# Patient Record
Sex: Female | Born: 1950
Health system: Southern US, Community
[De-identification: ages and names within clinical notes are randomized; demographics above are authoritative.]

## PROBLEM LIST (undated history)

## (undated) DIAGNOSIS — F329 Major depressive disorder, single episode, unspecified: Secondary | ICD-10-CM

## (undated) DIAGNOSIS — F32A Depression, unspecified: Secondary | ICD-10-CM

## (undated) DIAGNOSIS — I10 Essential (primary) hypertension: Secondary | ICD-10-CM

## (undated) DIAGNOSIS — E785 Hyperlipidemia, unspecified: Secondary | ICD-10-CM

## (undated) HISTORY — DX: Hyperlipidemia, unspecified: E78.5

## (undated) HISTORY — PX: BREAST BIOPSY: SHX20

## (undated) HISTORY — PX: BREAST SURGERY: SHX581

## (undated) HISTORY — PX: REDUCTION MAMMAPLASTY: SUR839

## (undated) HISTORY — PX: BREAST EXCISIONAL BIOPSY: SUR124

---
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
## 1998-07-22 ENCOUNTER — Encounter: Admission: RE | Admit: 1998-07-22 | Discharge: 1998-10-20 | Payer: Self-pay | Admitting: Pulmonary Disease

## 1998-12-09 ENCOUNTER — Encounter: Payer: Self-pay | Admitting: Emergency Medicine

## 1998-12-09 ENCOUNTER — Emergency Department (HOSPITAL_COMMUNITY): Admission: EM | Admit: 1998-12-09 | Discharge: 1998-12-09 | Payer: Self-pay | Admitting: Emergency Medicine

## 2000-01-21 ENCOUNTER — Emergency Department (HOSPITAL_COMMUNITY): Admission: EM | Admit: 2000-01-21 | Discharge: 2000-01-21 | Payer: Self-pay | Admitting: Emergency Medicine

## 2000-01-21 ENCOUNTER — Encounter: Payer: Self-pay | Admitting: Emergency Medicine

## 2004-05-09 ENCOUNTER — Emergency Department (HOSPITAL_COMMUNITY): Admission: EM | Admit: 2004-05-09 | Discharge: 2004-05-10 | Payer: Self-pay | Admitting: Emergency Medicine

## 2004-05-10 ENCOUNTER — Inpatient Hospital Stay (HOSPITAL_COMMUNITY): Admission: AD | Admit: 2004-05-10 | Discharge: 2004-05-10 | Payer: Self-pay | Admitting: Psychiatry

## 2011-11-05 ENCOUNTER — Other Ambulatory Visit: Payer: Self-pay

## 2011-11-05 ENCOUNTER — Emergency Department (HOSPITAL_COMMUNITY): Payer: Medicaid Other

## 2011-11-05 ENCOUNTER — Encounter: Payer: Self-pay | Admitting: *Deleted

## 2011-11-05 ENCOUNTER — Emergency Department (HOSPITAL_COMMUNITY)
Admission: EM | Admit: 2011-11-05 | Discharge: 2011-11-05 | Disposition: A | Discharge status: Home / Self Care | Payer: Medicaid Other | Attending: Emergency Medicine | Admitting: Emergency Medicine

## 2011-11-05 DIAGNOSIS — R0789 Other chest pain: Secondary | ICD-10-CM | POA: Insufficient documentation

## 2011-11-05 DIAGNOSIS — R109 Unspecified abdominal pain: Secondary | ICD-10-CM | POA: Insufficient documentation

## 2011-11-05 HISTORY — DX: Major depressive disorder, single episode, unspecified: F32.9

## 2011-11-05 HISTORY — DX: Depression, unspecified: F32.A

## 2011-11-05 LAB — DIFFERENTIAL
Basophils Absolute: 0.1 10*3/uL (ref 0.0–0.1)
Basophils Relative: 1 % (ref 0–1)
Eosinophils Absolute: 0.1 10*3/uL (ref 0.0–0.7)
Eosinophils Relative: 3 % (ref 0–5)
Lymphocytes Relative: 33 % (ref 12–46)
Lymphs Abs: 1.7 10*3/uL (ref 0.7–4.0)
Monocytes Absolute: 0.4 10*3/uL (ref 0.1–1.0)
Monocytes Relative: 8 % (ref 3–12)
Neutro Abs: 2.8 10*3/uL (ref 1.7–7.7)
Neutrophils Relative %: 56 % (ref 43–77)

## 2011-11-05 LAB — COMPREHENSIVE METABOLIC PANEL
ALT: 22 U/L (ref 0–35)
AST: 21 U/L (ref 0–37)
Albumin: 4 g/dL (ref 3.5–5.2)
Alkaline Phosphatase: 114 U/L (ref 39–117)
BUN: 13 mg/dL (ref 6–23)
CO2: 28 mEq/L (ref 19–32)
Calcium: 9.8 mg/dL (ref 8.4–10.5)
Chloride: 101 mEq/L (ref 96–112)
Creatinine, Ser: 1.25 mg/dL — ABNORMAL HIGH (ref 0.50–1.10)
GFR calc Af Amer: 54 mL/min — ABNORMAL LOW (ref >90)
GFR calc non Af Amer: 46 mL/min — ABNORMAL LOW (ref >90)
Glucose, Bld: 86 mg/dL (ref 70–99)
Potassium: 3.5 mEq/L (ref 3.5–5.1)
Sodium: 140 mEq/L (ref 135–145)
Total Bilirubin: 0.5 mg/dL (ref 0.3–1.2)
Total Protein: 7.9 g/dL (ref 6.0–8.3)

## 2011-11-05 LAB — CBC
HCT: 41.9 % (ref 36.0–46.0)
Hemoglobin: 14.7 g/dL (ref 12.0–15.0)
MCH: 29.9 pg (ref 26.0–34.0)
MCHC: 35.1 g/dL (ref 30.0–36.0)
MCV: 85.3 fL (ref 78.0–100.0)
Platelets: 247 10*3/uL (ref 150–400)
RBC: 4.91 MIL/uL (ref 3.87–5.11)
RDW: 13.4 % (ref 11.5–15.5)
WBC: 5.1 10*3/uL (ref 4.0–10.5)

## 2011-11-05 LAB — LACTIC ACID, PLASMA: Lactic Acid, Venous: 1.1 mmol/L (ref 0.5–2.2)

## 2011-11-05 MED ORDER — ONDANSETRON HCL 4 MG/2ML IJ SOLN
4.0000 mg | Freq: Once | INTRAMUSCULAR | Status: AC
  Administered 2011-11-05: 4 mg via INTRAVENOUS

## 2011-11-05 MED ORDER — HYDROMORPHONE HCL PF 2 MG/ML IJ SOLN
INTRAMUSCULAR | Status: AC
  Administered 2011-11-05: 1 mg
  Filled 2011-11-05: qty 1

## 2011-11-05 MED ORDER — ONDANSETRON HCL 4 MG/2ML IJ SOLN
INTRAMUSCULAR | Status: AC
  Filled 2011-11-05: qty 2

## 2011-11-05 MED ORDER — HYDROMORPHONE HCL 1 MG/ML PO LIQD: 1.0000 mg | Freq: Once | ORAL | Status: DC

## 2011-11-05 MED ORDER — IOHEXOL 300 MG/ML  SOLN
100.0000 mL | Freq: Once | INTRAMUSCULAR | Status: AC | PRN
  Administered 2011-11-05: 100 mL via INTRAVENOUS

## 2011-11-05 MED ORDER — SODIUM CHLORIDE 0.9 % IV BOLUS (SEPSIS): 1000.0000 mL | Freq: Once | INTRAVENOUS | Status: DC

## 2011-11-05 MED ORDER — HYDROCODONE-ACETAMINOPHEN 5-325 MG PO TABS: 2.0000 | ORAL_TABLET | ORAL | Status: AC | PRN
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
## 2011-11-05 NOTE — ED Notes (Signed)
Requested pt give urine sample. Pt attempted to give sample but was unable. Will attempt again later.

## 2011-11-05 NOTE — ED Provider Notes (Signed)
6:57 PM  Date: 11/05/2011  Rate: 73  Rhythm: normal sinus rhythm  QRS Axis: normal  Intervals: normal  ST/T Wave abnormalities: normal  Conduction Disutrbances:none  Narrative Interpretation: Normal EKG  Old EKG Reviewed: unchanged    Carleene Cooper III, MD 11/06/11 [phone]
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
## 2012-02-01 ENCOUNTER — Other Ambulatory Visit (HOSPITAL_COMMUNITY)
Admission: RE | Admit: 2012-02-01 | Discharge: 2012-02-01 | Disposition: A | Discharge status: Home / Self Care | Payer: PRIVATE HEALTH INSURANCE | Source: Ambulatory Visit | Attending: Family Medicine | Admitting: Family Medicine

## 2012-02-01 ENCOUNTER — Other Ambulatory Visit: Payer: Self-pay | Admitting: Family Medicine

## 2012-02-01 DIAGNOSIS — Z1159 Encounter for screening for other viral diseases: Secondary | ICD-10-CM | POA: Insufficient documentation

## 2012-02-01 DIAGNOSIS — Z124 Encounter for screening for malignant neoplasm of cervix: Secondary | ICD-10-CM | POA: Insufficient documentation

## 2012-02-02 ENCOUNTER — Other Ambulatory Visit: Payer: Self-pay | Admitting: Family Medicine

## 2012-02-02 DIAGNOSIS — Z1231 Encounter for screening mammogram for malignant neoplasm of breast: Secondary | ICD-10-CM

## 2012-03-02 ENCOUNTER — Ambulatory Visit: Payer: Medicaid Other

## 2012-03-08 ENCOUNTER — Ambulatory Visit: Payer: Medicaid Other

## 2012-03-09 ENCOUNTER — Ambulatory Visit
Admission: RE | Admit: 2012-03-09 | Discharge: 2012-03-09 | Disposition: A | Discharge status: Home / Self Care | Payer: Medicaid Other | Source: Ambulatory Visit | Attending: Family Medicine | Admitting: Family Medicine

## 2012-03-09 DIAGNOSIS — Z1231 Encounter for screening mammogram for malignant neoplasm of breast: Secondary | ICD-10-CM

## 2013-03-09 ENCOUNTER — Other Ambulatory Visit (HOSPITAL_COMMUNITY)
Admission: RE | Admit: 2013-03-09 | Discharge: 2013-03-09 | Disposition: A | Discharge status: Home / Self Care | Payer: PRIVATE HEALTH INSURANCE | Source: Ambulatory Visit | Attending: Family Medicine | Admitting: Family Medicine

## 2013-03-09 ENCOUNTER — Other Ambulatory Visit: Payer: Self-pay | Admitting: Family Medicine

## 2013-03-09 DIAGNOSIS — Z124 Encounter for screening for malignant neoplasm of cervix: Secondary | ICD-10-CM | POA: Insufficient documentation

## 2013-03-09 DIAGNOSIS — Z1151 Encounter for screening for human papillomavirus (HPV): Secondary | ICD-10-CM | POA: Insufficient documentation

## 2013-03-16 ENCOUNTER — Other Ambulatory Visit: Payer: Self-pay

## 2013-03-16 DIAGNOSIS — Z1231 Encounter for screening mammogram for malignant neoplasm of breast: Secondary | ICD-10-CM

## 2013-04-17 ENCOUNTER — Ambulatory Visit
Admission: RE | Admit: 2013-04-17 | Discharge: 2013-04-17 | Disposition: A | Discharge status: Home / Self Care | Payer: PRIVATE HEALTH INSURANCE | Source: Ambulatory Visit

## 2013-04-17 DIAGNOSIS — Z1231 Encounter for screening mammogram for malignant neoplasm of breast: Secondary | ICD-10-CM

## 2014-03-11 ENCOUNTER — Other Ambulatory Visit: Payer: Self-pay

## 2014-03-11 DIAGNOSIS — Z1231 Encounter for screening mammogram for malignant neoplasm of breast: Secondary | ICD-10-CM

## 2014-03-11 DIAGNOSIS — Z9889 Other specified postprocedural states: Secondary | ICD-10-CM

## 2014-04-18 ENCOUNTER — Ambulatory Visit: Payer: Medicaid Other

## 2014-04-29 ENCOUNTER — Ambulatory Visit: Payer: Medicaid Other

## 2014-05-22 ENCOUNTER — Ambulatory Visit: Payer: Medicaid Other | Admitting: Cardiology

## 2014-05-31 ENCOUNTER — Ambulatory Visit: Payer: Medicaid Other | Admitting: Cardiology

## 2014-06-11 ENCOUNTER — Encounter: Payer: Self-pay | Admitting: *Deleted

## 2014-07-08 ENCOUNTER — Encounter: Payer: Self-pay | Admitting: General Surgery

## 2014-07-08 DIAGNOSIS — E785 Hyperlipidemia, unspecified: Secondary | ICD-10-CM

## 2014-07-08 DIAGNOSIS — R002 Palpitations: Secondary | ICD-10-CM

## 2014-07-12 ENCOUNTER — Ambulatory Visit: Payer: Medicaid Other

## 2014-07-25 ENCOUNTER — Ambulatory Visit: Payer: Medicaid Other

## 2014-07-26 ENCOUNTER — Ambulatory Visit: Payer: Medicaid Other

## 2014-08-06 ENCOUNTER — Institutional Professional Consult (permissible substitution): Payer: Medicaid Other | Admitting: Cardiology

## 2014-08-30 ENCOUNTER — Encounter: Payer: Self-pay | Admitting: Cardiology

## 2014-09-23 ENCOUNTER — Institutional Professional Consult (permissible substitution): Payer: Medicaid Other | Admitting: Cardiology

## 2014-09-27 ENCOUNTER — Institutional Professional Consult (permissible substitution): Payer: Medicaid Other | Admitting: Cardiology

## 2015-02-05 ENCOUNTER — Other Ambulatory Visit: Payer: Self-pay | Admitting: Family Medicine

## 2015-02-05 DIAGNOSIS — R109 Unspecified abdominal pain: Secondary | ICD-10-CM

## 2015-02-12 ENCOUNTER — Other Ambulatory Visit: Payer: Medicaid Other

## 2015-02-13 ENCOUNTER — Ambulatory Visit
Admission: RE | Admit: 2015-02-13 | Discharge: 2015-02-13 | Disposition: A | Discharge status: Home / Self Care | Payer: Medicare Other | Source: Ambulatory Visit | Attending: Family Medicine | Admitting: Family Medicine

## 2015-02-13 ENCOUNTER — Encounter (INDEPENDENT_AMBULATORY_CARE_PROVIDER_SITE_OTHER): Payer: Self-pay

## 2015-02-13 DIAGNOSIS — R109 Unspecified abdominal pain: Secondary | ICD-10-CM

## 2015-02-13 MED ORDER — IOHEXOL 300 MG/ML  SOLN
100.0000 mL | Freq: Once | INTRAMUSCULAR | Status: AC | PRN
  Administered 2015-02-13: 100 mL via INTRAVENOUS

## 2015-02-20 ENCOUNTER — Other Ambulatory Visit: Payer: Self-pay | Admitting: Family Medicine

## 2015-02-20 DIAGNOSIS — R109 Unspecified abdominal pain: Secondary | ICD-10-CM

## 2015-03-04 ENCOUNTER — Other Ambulatory Visit: Payer: Medicare Other

## 2015-03-11 ENCOUNTER — Other Ambulatory Visit: Payer: Medicare Other

## 2015-03-13 ENCOUNTER — Other Ambulatory Visit: Payer: Medicare Other

## 2015-03-17 ENCOUNTER — Ambulatory Visit
Admission: RE | Admit: 2015-03-17 | Discharge: 2015-03-17 | Disposition: A | Discharge status: Home / Self Care | Payer: Medicare Other | Source: Ambulatory Visit | Attending: Family Medicine | Admitting: Family Medicine

## 2015-03-17 DIAGNOSIS — R109 Unspecified abdominal pain: Secondary | ICD-10-CM

## 2015-03-17 MED ORDER — GADOBENATE DIMEGLUMINE 529 MG/ML IV SOLN
15.0000 mL | Freq: Once | INTRAVENOUS | Status: AC | PRN
  Administered 2015-03-17: 15 mL via INTRAVENOUS

## 2015-03-28 ENCOUNTER — Encounter: Payer: Self-pay | Admitting: Vascular Surgery

## 2015-04-18 ENCOUNTER — Emergency Department (HOSPITAL_COMMUNITY)
Admission: EM | Admit: 2015-04-18 | Discharge: 2015-04-18 | Disposition: A | Discharge status: Home / Self Care | Payer: Medicare Other | Attending: Emergency Medicine | Admitting: Emergency Medicine

## 2015-04-18 ENCOUNTER — Encounter (HOSPITAL_COMMUNITY): Payer: Self-pay

## 2015-04-18 DIAGNOSIS — Z8639 Personal history of other endocrine, nutritional and metabolic disease: Secondary | ICD-10-CM | POA: Diagnosis not present

## 2015-04-18 DIAGNOSIS — J45909 Unspecified asthma, uncomplicated: Secondary | ICD-10-CM | POA: Insufficient documentation

## 2015-04-18 DIAGNOSIS — F329 Major depressive disorder, single episode, unspecified: Secondary | ICD-10-CM | POA: Insufficient documentation

## 2015-04-18 DIAGNOSIS — Z79899 Other long term (current) drug therapy: Secondary | ICD-10-CM | POA: Diagnosis not present

## 2015-04-18 DIAGNOSIS — I10 Essential (primary) hypertension: Secondary | ICD-10-CM | POA: Insufficient documentation

## 2015-04-18 DIAGNOSIS — R51 Headache: Secondary | ICD-10-CM | POA: Diagnosis present

## 2015-04-18 LAB — I-STAT CHEM 8, ED
BUN: 14 mg/dL (ref 6–23)
Calcium, Ion: 1.21 mmol/L (ref 1.13–1.30)
Chloride: 102 mmol/L (ref 96–112)
Creatinine, Ser: 1.1 mg/dL (ref 0.50–1.10)
Glucose, Bld: 89 mg/dL (ref 70–99)
HCT: 44 % (ref 36.0–46.0)
Hemoglobin: 15 g/dL (ref 12.0–15.0)
Potassium: 4.1 mmol/L (ref 3.5–5.1)
Sodium: 139 mmol/L (ref 135–145)
TCO2: 26 mmol/L (ref 0–100)

## 2015-04-18 MED ORDER — HYDROCHLOROTHIAZIDE 25 MG PO TABS: 25.0000 mg | ORAL_TABLET | Freq: Every day | ORAL | Status: DC

## 2015-04-18 NOTE — ED Notes (Addendum)
Pt. Having a generalized headache for 2 days.  Pt. Denies any numbness or tingling.  Pt. Denies any blurred vision.  Went to her MD today for check-up for this headache.   She was sent to [place] for her High BP.  Pt. Was taking  Medication , she took her self off.   BP 165/82  Pt. Denies any chest pain or sob.  Pt. Denies any v/d.   Pt. Is nauseated. Pt. Has not ate all day.   GCS 15. Skin is warm and dry. Pt. Denies headache at present time.  She reports that the pain is intermittent and sharp twinges.

## 2015-04-18 NOTE — Discharge Instructions (Signed)
Hydrochlorothiazide, HCTZ capsules or tablets What is this medicine? HYDROCHLOROTHIAZIDE (hye droe klor oh THYE a zide) is a diuretic. It increases the amount of urine passed, which causes the body to lose salt and water. This medicine is used to treat high blood pressure. It is also reduces the swelling and water retention caused by various medical conditions, such as heart, liver, or kidney disease. This medicine may be used for other purposes; ask your health care provider or pharmacist if you have questions. COMMON BRAND NAME(S): Esidrix, Ezide, HydroDIURIL, Microzide, Oretic, Zide What should I tell my health care provider before I take this medicine? They need to know if you have any of these conditions: -diabetes -gout -immune system problems, like lupus -kidney disease or kidney stones -liver disease -pancreatitis -small amount of urine or difficulty passing urine -an unusual or allergic reaction to hydrochlorothiazide, sulfa drugs, other medicines, foods, dyes, or preservatives -pregnant or trying to get pregnant -breast-feeding How should I use this medicine? Take this medicine by mouth with a glass of water. Follow the directions on the prescription label. Take your medicine at regular intervals. Remember that you will need to pass urine frequently after taking this medicine. Do not take your doses at a time of day that will cause you problems. Do not stop taking your medicine unless your doctor tells you to. Talk to your pediatrician regarding the use of this medicine in children. Special care may be needed. Overdosage: If you think you have taken too much of this medicine contact a poison control center or emergency room at once. NOTE: This medicine is only for you. Do not share this medicine with others. What if I miss a dose? If you miss a dose, take it as soon as you can. If it is almost time for your next dose, take only that dose. Do not take double or extra doses. What may  interact with this medicine? -cholestyramine -colestipol -digoxin -dofetilide -lithium -medicines for blood pressure -medicines for diabetes -medicines that relax muscles for surgery -other diuretics -steroid medicines like prednisone or cortisone This list may not describe all possible interactions. Give your health care provider a list of all the medicines, herbs, non-prescription drugs, or dietary supplements you use. Also tell them if you smoke, drink alcohol, or use illegal drugs. Some items may interact with your medicine. What should I watch for while using this medicine? Visit your doctor or health care professional for regular checks on your progress. Check your blood pressure as directed. Ask your doctor or health care professional what your blood pressure should be and when you should contact him or her. You may need to be on a special diet while taking this medicine. Ask your doctor. Check with your doctor or health care professional if you get an attack of severe diarrhea, nausea and vomiting, or if you sweat a lot. The loss of too much body fluid can make it dangerous for you to take this medicine. You may get drowsy or dizzy. Do not drive, use machinery, or do anything that needs mental alertness until you know how this medicine affects you. Do not stand or sit up quickly, especially if you are an older patient. This reduces the risk of dizzy or fainting spells. Alcohol may interfere with the effect of this medicine. Avoid alcoholic drinks. This medicine may affect your blood sugar level. If you have diabetes, check with your doctor or health care professional before changing the dose of your diabetic medicine. This medicine  can make you more sensitive to the sun. Keep out of the sun. If you cannot avoid being in the sun, wear protective clothing and use sunscreen. Do not use sun lamps or tanning beds/booths. What side effects may I notice from receiving this medicine? Side effects  that you should report to your doctor or health care professional as soon as possible: -allergic reactions such as skin rash or itching, hives, swelling of the lips, mouth, tongue, or throat -changes in vision -chest pain -eye pain -fast or irregular heartbeat -feeling faint or lightheaded, falls -gout attack -muscle pain or cramps -pain or difficulty when passing urine -pain, tingling, numbness in the hands or feet -redness, blistering, peeling or loosening of the skin, including inside the mouth -unusually weak or tired Side effects that usually do not require medical attention (report to your doctor or health care professional if they continue or are bothersome): -change in sex drive or performance -dry mouth -headache -stomach upset This list may not describe all possible side effects. Call your doctor for medical advice about side effects. You may report side effects to FDA at 1-800-FDA-1088. Where should I keep my medicine? Keep out of the reach of children. Store at room temperature between 15 and 30 degrees C (59 and 86 degrees F). Do not freeze. Protect from light and moisture. Keep container closed tightly. Throw away any unused medicine after the expiration date. NOTE: This sheet is a summary. It may not cover all possible information. If you have questions about this medicine, talk to your doctor, pharmacist, or health care provider.  2015, [employer]vier/Gold Standard. (2010-07-31 12:57:37) Hypertension Hypertension, commonly called high blood pressure, is when the force of blood pumping through your arteries is too strong. Your arteries are the blood vessels that carry blood from your heart throughout your body. A blood pressure reading consists of a higher number over a lower number, such as 110/72. The higher number (systolic) is the pressure inside your arteries when your heart pumps. The lower number (diastolic) is the pressure inside your arteries when your heart relaxes.  Ideally you want your blood pressure below 120/80. Hypertension forces your heart to work harder to pump blood. Your arteries may become narrow or stiff. Having hypertension puts you at risk for heart disease, stroke, and other problems.  RISK FACTORS Some risk factors for high blood pressure are controllable. Others are not.  Risk factors you cannot control include:   Race. You may be at higher risk if you are African American.  Age. Risk increases with age.  Gender. Men are at higher risk than women before age 53 years. After age 36, women are at higher risk than men. Risk factors you can control include:  Not getting enough exercise or physical activity.  Being overweight.  Getting too much fat, sugar, calories, or salt in your diet.  Drinking too much alcohol. SIGNS AND SYMPTOMS Hypertension does not usually cause signs or symptoms. Extremely high blood pressure (hypertensive crisis) may cause headache, anxiety, shortness of breath, and nosebleed. DIAGNOSIS  To check if you have hypertension, your health care provider will measure your blood pressure while you are seated, with your arm held at the level of your heart. It should be measured at least twice using the same arm. Certain conditions can cause a difference in blood pressure between your right and left arms. A blood pressure reading that is higher than normal on one occasion does not mean that you need treatment. If one blood pressure  reading is high, ask your health care provider about having it checked again. TREATMENT  Treating high blood pressure includes making lifestyle changes and possibly taking medicine. Living a healthy lifestyle can help lower high blood pressure. You may need to change some of your habits. Lifestyle changes may include:  Following the DASH diet. This diet is high in fruits, vegetables, and whole grains. It is low in salt, red meat, and added sugars.  Getting at least 2 hours of brisk physical  activity every week.  Losing weight if necessary.  Not smoking.  Limiting alcoholic beverages.  Learning ways to reduce stress. If lifestyle changes are not enough to get your blood pressure under control, your health care provider may prescribe medicine. You may need to take more than one. Work closely with your health care provider to understand the risks and benefits. HOME CARE INSTRUCTIONS  Have your blood pressure rechecked as directed by your health care provider.   Take medicines only as directed by your health care provider. Follow the directions carefully. Blood pressure medicines must be taken as prescribed. The medicine does not work as well when you skip doses. Skipping doses also puts you at risk for problems.   Do not smoke.   Monitor your blood pressure at home as directed by your health care provider. SEEK MEDICAL CARE IF:   You think you are having a reaction to medicines taken.  You have recurrent headaches or feel dizzy.  You have swelling in your ankles.  You have trouble with your vision. SEEK IMMEDIATE MEDICAL CARE IF:  You develop a severe headache or confusion.  You have unusual weakness, numbness, or feel faint.  You have severe chest or abdominal pain.  You vomit repeatedly.  You have trouble breathing. MAKE SURE YOU:   Understand these instructions.  Will watch your condition.  Will get help right away if you are not doing well or get worse. Document Released: 12/06/2005 Document Revised: 04/22/2014 Document Reviewed: 09/28/2013 [facility] Patient Information 2015 [place], [place]. This information is not intended to replace advice given to you by your health care provider. Make sure you discuss any questions you have with your health care provider.

## 2015-04-18 NOTE — ED Provider Notes (Signed)
CSN: [id number]     Arrival date & time 04/18/15  1549 History   First MD Initiated Contact with Patient 04/18/15 1801     Chief Complaint  Patient presents with  . Headache    HPI Pt was seen at her doctors office today for headache.    Pt use to be on blood pressure medications.   She stopped taking maybe 6 months to a year.  Over the last few weeks she has been having increased stressors.  BP has been running higher.  She has had a headache diffusely for the last couple days.  Intermittent and sharp.  No vomiting.  No numbness or weakness.  Her doctor noticed her high blood pressure and sent her here.  Her BP has been up to 664Q systolic.  Headache is resolved now. Past Medical History  Diagnosis Date  . Depression     MAJOR DEPRESSION, DR. KAUR;PSYCHIATRIST  . Asthma   . Hyperlipidemia    Past Surgical History  Procedure Laterality Date  . Breast surgery     Family History  Problem Relation Age of Onset  . Pancreatic cancer Mother   . Hypertension Sister   . Heart attack Brother   . Colon cancer Paternal Uncle   . Mental illness Sister   . Hypertension Brother    History  Substance Use Topics  . Smoking status: Never Smoker   . Smokeless tobacco: Not on file  . Alcohol Use: No   OB History    No data available     Review of Systems  All other systems reviewed and are negative.     Allergies  Seroquel  Home Medications   Prior to Admission medications   Medication Sig Start Date End Date Taking? Authorizing Provider  albuterol (PROVENTIL HFA;VENTOLIN HFA) 108 (90 BASE) MCG/ACT inhaler Inhale 2 puffs into the lungs every 6 (six) hours as needed for wheezing or shortness of breath.   Yes Historical Provider, MD  clonazePAM (KLONOPIN) 1 MG tablet Take 4 mg by mouth at bedtime.    Yes Historical Provider, MD  FLUoxetine (PROZAC) 40 MG capsule Take 40 mg by mouth 2 (two) times daily.     Yes Historical Provider, MD  zolpidem (AMBIEN) 5 MG tablet Take 10 mg by  mouth at bedtime as needed. For sleep   Yes Historical Provider, MD  hydrochlorothiazide (HYDRODIURIL) 25 MG tablet Take 1 tablet (25 mg total) by mouth daily. 04/18/15   Dorie Rank, MD   BP 159/77 mmHg  Pulse 52  Temp(Src) 98.8 F (37.1 C) (Oral)  Resp 16  SpO2 100% Physical Exam  Constitutional: She appears well-developed and well-nourished. No distress.  HENT:  Head: Normocephalic and atraumatic.  Right Ear: External ear normal.  Left Ear: External ear normal.  Eyes: Conjunctivae are normal. Right eye exhibits no discharge. Left eye exhibits no discharge. No scleral icterus.  Neck: Neck supple. No tracheal deviation present.  Cardiovascular: Normal rate, regular rhythm and intact distal pulses.   Pulmonary/Chest: Effort normal and breath sounds normal. No stridor. No respiratory distress. She has no wheezes. She has no rales.  Abdominal: Soft. Bowel sounds are normal. She exhibits no distension. There is no tenderness. There is no rebound and no guarding.  Musculoskeletal: She exhibits no edema or tenderness.  Neurological: She is alert. She has normal strength. No cranial nerve deficit (no facial droop, extraocular movements intact, no slurred speech) or sensory deficit. She exhibits normal muscle tone. She displays no seizure activity.  Coordination normal.  Skin: Skin is warm and dry. No rash noted.  Psychiatric: She has a normal mood and affect.  Nursing note and vitals reviewed.   ED Course  Procedures (including critical care time) Labs Review Labs Reviewed  I-STAT CHEM 8, ED    MDM   Final diagnoses:  Essential hypertension    Pt's BP continued to improve without medications.  She does not have a headache.  Neuro exam is normal.  Doubt stroke or other acute complication associated with her BP.  Will dc home with HCTZ rx.  She use to take that medication.    Dorie Rank, MD 04/18/15 [phone]

## 2015-04-23 ENCOUNTER — Encounter: Payer: Medicare Other | Admitting: Vascular Surgery

## 2015-05-07 ENCOUNTER — Other Ambulatory Visit (HOSPITAL_COMMUNITY): Payer: Medicare Other

## 2015-05-07 ENCOUNTER — Encounter: Payer: Medicare Other | Admitting: Vascular Surgery

## 2015-06-02 ENCOUNTER — Encounter: Payer: Self-pay | Admitting: Vascular Surgery

## 2015-06-04 ENCOUNTER — Ambulatory Visit (INDEPENDENT_AMBULATORY_CARE_PROVIDER_SITE_OTHER): Payer: Medicare Other | Admitting: Vascular Surgery

## 2015-06-04 ENCOUNTER — Encounter: Payer: Self-pay | Admitting: Vascular Surgery

## 2015-06-04 VITALS — BP 173/91 | HR 64 | Ht 61.0 in | Wt 158.0 lb

## 2015-06-04 DIAGNOSIS — I70209 Unspecified atherosclerosis of native arteries of extremities, unspecified extremity: Secondary | ICD-10-CM | POA: Diagnosis not present

## 2015-06-04 NOTE — Progress Notes (Signed)
Vascular and Vein Specialist of [facility]  Patient name: Katie Potts MRN: [id number] DOB: [birth year] Sex: female  REASON FOR CONSULT: atherosclerosis noted on CT scan and MRI  HPI: Katie Potts is a 64 y.o. female who has been having abdominal pain off and on for approximately 2 years. Workup for this included a CT scan which showed some calcific disease in her aorta. There was no evidence of mesenteric artery occlusive disease. In addition the patient had an MRI which showed no evidence of mesenteric artery occlusive disease. There appeared to be possibly some stenosis at the origin of both renal arteries. The patient was sent for vascular consultation.  On history, she does describe some claudication symptoms in both calves. She has pain in the calf which is brought on by ambulation and  relieved with rest. She experiences some pain in the hip which is more likely related to arthritis. She denies any history of rest pain or history of nonhealing ulcers. She denies any previous history of DVT.   Past Medical History  Diagnosis Date  . Depression     MAJOR DEPRESSION, DR. KAUR;PSYCHIATRIST  . Asthma   . Hyperlipidemia    Family History  Problem Relation Age of Onset  . Pancreatic cancer Mother   . Cancer Mother   . Hyperlipidemia Mother   . Hypertension Mother   . Varicose Veins Mother   . Hypertension Sister   . Hyperlipidemia Sister   . Heart attack Brother   . Colon cancer Paternal Uncle   . Mental illness Sister   . Hyperlipidemia Sister   . Hypertension Sister   . Hypertension Brother   . Cancer Father   . Deep vein thrombosis Father   . Diabetes Father   . Heart disease Father   . Hyperlipidemia Father   . Hypertension Father   . Peripheral vascular disease Father     amputation  . AAA (abdominal aortic aneurysm) Father    SOCIAL HISTORY: History  Substance Use Topics  . Smoking status: Never Smoker   . Smokeless tobacco: Not on file  . Alcohol Use: No    Allergies  Allergen Reactions  . Seroquel [Quetiapine Fumarate] Swelling   Current Outpatient Prescriptions  Medication Sig Dispense Refill  . albuterol (PROVENTIL HFA;VENTOLIN HFA) 108 (90 BASE) MCG/ACT inhaler Inhale 2 puffs into the lungs every 6 (six) hours as needed for wheezing or shortness of breath.    . clonazePAM (KLONOPIN) 1 MG tablet Take 4 mg by mouth at bedtime.     Marland Kitchen FLUoxetine (PROZAC) 40 MG capsule Take 40 mg by mouth 2 (two) times daily.      . hydrochlorothiazide (HYDRODIURIL) 25 MG tablet Take 1 tablet (25 mg total) by mouth daily. 30 tablet 1  . triamcinolone cream (KENALOG) 0.1 % Apply 1 application topically.     Marland Kitchen zolpidem (AMBIEN) 5 MG tablet Take 10 mg by mouth at bedtime as needed. For sleep     No current facility-administered medications for this visit.   REVIEW OF SYSTEMS: Valu.Nieves ] denotes positive finding; [  ] denotes negative finding  CARDIOVASCULAR:  [ ]  chest pain   [ ]  chest pressure   Valu.Nieves ] palpitations   [ ]  orthopnea   [ ]  dyspnea on exertion   [ ]  claudication   [ ]  rest pain   [ ]  DVT   [ ]  phlebitis PULMONARY:   [ ]  productive cough   Valu.Nieves ] asthma   Valu.Nieves ] wheezing NEUROLOGIC:   [ ]   weakness  [ ]  paresthesias  [ ]  aphasia  [ ]  amaurosis  [ ]  dizziness HEMATOLOGIC:   [ ]  bleeding problems   [ ]  clotting disorders MUSCULOSKELETAL:  [ ]  joint pain   [ ]  joint swelling Valu.Nieves ] leg swelling GASTROINTESTINAL: [ ]   blood in stool  [ ]   hematemesis GENITOURINARY:  [ ]   dysuria  [ ]   hematuria PSYCHIATRIC:  Valu.Nieves ] history of major depression INTEGUMENTARY:  Valu.Nieves ] rashes  [ ]  ulcers CONSTITUTIONAL:  [ ]  fever   Valu.Nieves ] chills  PHYSICAL EXAM: Filed Vitals:   06/04/15 1317 06/04/15 1326  BP: 186/103 173/91  Pulse: 64   Height: 5\' 1"  (1.549 m)   Weight: 158 lb (71.668 kg)   SpO2: 98%    GENERAL: The patient is a well-nourished female, in no acute distress. The vital signs are documented above. CARDIOVASCULAR: There is a regular rate and rhythm. I did not take any  carotid bruits. She has palpable femoral pulses and palpable popliteal pulses bilaterally. She has a palpable left dorsalis pedis pulse. I cannot palpate pulses in the right foot. PULMONARY: There is good air exchange bilaterally without wheezing or rales. ABDOMEN: Soft and non-tender with normal pitched bowel sounds.  MUSCULOSKELETAL: There are no major deformities or cyanosis. NEUROLOGIC: No focal weakness or paresthesias are detected. SKIN: There are no ulcers or rashes noted. PSYCHIATRIC: The patient has a normal affect.  DATA:  I have reviewed her CT scan. She has mild calcification of her aorta but this is not especially impressive. I do not see any evidence of significant mesenteric or renal artery occlusive disease.  I have reviewed her MRI also which suggest possible proximal renal artery disease although this is difficult to quantitate.  MEDICAL ISSUES:  ATHEROSCLEROSIS OF THE AORTA: Although her CT scan suggests some atherosclerotic disease of her aorta I did not think that her abdominal pain is related to chronic mesenteric ischemia. Her pain is not postprandial and she has not had any weight loss. In addition there is no evidence of significant mesenteric arterial occlusive disease. With respect to her possible renal artery stenosis, her blood pressure currently is fairly well controlled on one medication and I would not recommend an aggressive workup for this unless her blood pressure became much more difficult to control. She does have evidence of some mild infrainguinal arterial occlusive disease on the right and I have simply encouraged her to walk as much as possible. She is not a smoker. I do not think any further workup is indicated at this time from a vascular standpoint. I'll be happy to see her back at any time if any new symptoms develop.   Deitra Mayo [facility] [place]: [phone]

## 2015-07-15 ENCOUNTER — Other Ambulatory Visit: Payer: Self-pay | Admitting: Family Medicine

## 2015-07-15 DIAGNOSIS — N644 Mastodynia: Secondary | ICD-10-CM

## 2015-07-21 ENCOUNTER — Other Ambulatory Visit: Payer: Medicare Other

## 2015-08-04 ENCOUNTER — Ambulatory Visit: Payer: Medicare Other | Admitting: Rehabilitative and Restorative Service Providers"

## 2015-08-27 ENCOUNTER — Other Ambulatory Visit: Payer: Medicare Other

## 2015-10-02 ENCOUNTER — Other Ambulatory Visit: Payer: Medicare Other

## 2015-10-10 ENCOUNTER — Ambulatory Visit
Admission: RE | Admit: 2015-10-10 | Discharge: 2015-10-10 | Disposition: A | Discharge status: Home / Self Care | Payer: Medicare Other | Source: Ambulatory Visit | Attending: Family Medicine | Admitting: Family Medicine

## 2015-10-10 DIAGNOSIS — N644 Mastodynia: Secondary | ICD-10-CM

## 2016-04-22 IMAGING — MR MR MRA ABDOMEN W/ OR W/O CM
7 of 11 series · 19 of 48 positions shown · IV contrast (multihance)
Comparison: CT 02/13/2015

CLINICAL DATA: 63-year-old female with abdominal pain. Concern for
mesenteric ischemia. Evidence of atherosclerotic disease on prior CT
of the abdomen.

EXAM:
MRA ABDOMEN WITH OR WITHOUT CONTRAST
TECHNIQUE: Angiographic images of the chest were obtained using MRA technique
without and with intravenous contrast.
CONTRAST:  15mL MULTIHANCE GADOBENATE DIMEGLUMINE 529 MG/ML IV SOLN

[Series 3: bSSFP · coronal · 5.0mm · 0.78mm/px · 1 of 19 slices shown (1 of 2)]
[im 1/19]
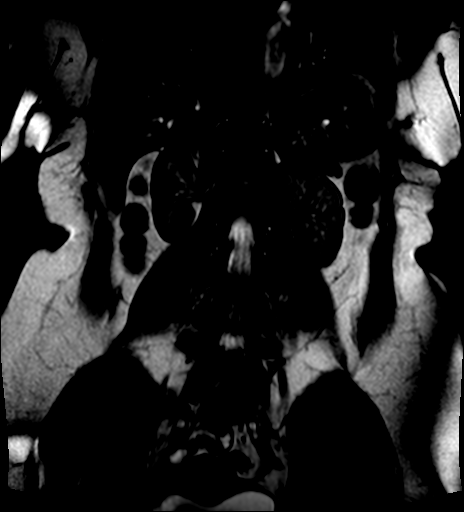

[Series 5: bSSFP · axial · 6.0mm · 0.74mm/px · z∈[+68,+197]mm · 2 of 19 slices shown (2 of 2)]
[im 1/19]
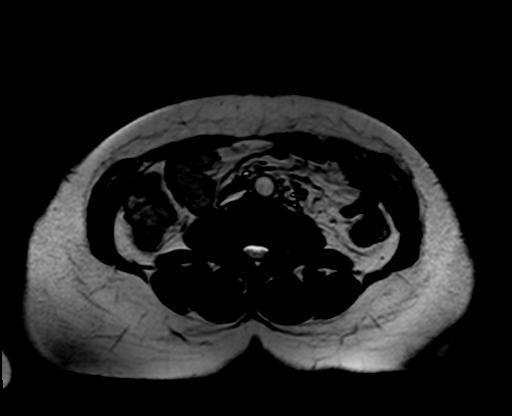
[im 19/19]
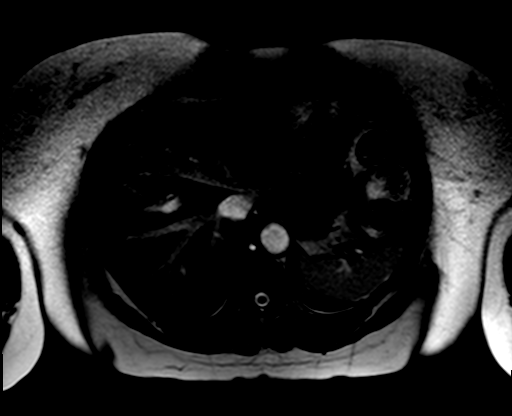

[Series 6: T2 fat-sat · coronal · 5.0mm · 0.74mm/px · 2 of 19 slices shown]
[im 1/19]
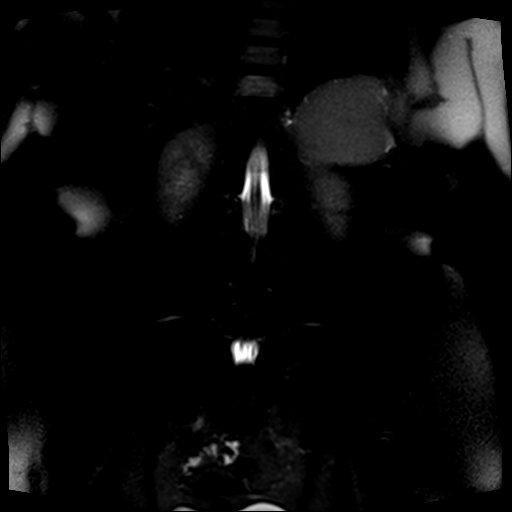
[im 19/19]
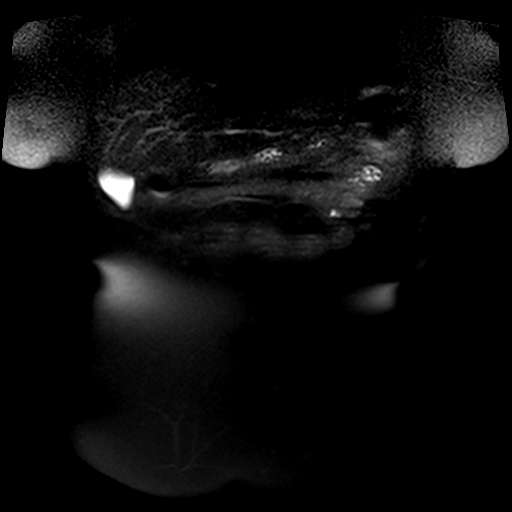

[Series 7: T1 fat-sat · coronal · 5.0mm · 0.74mm/px · 2 of 19 slices shown]
[im 1/19]
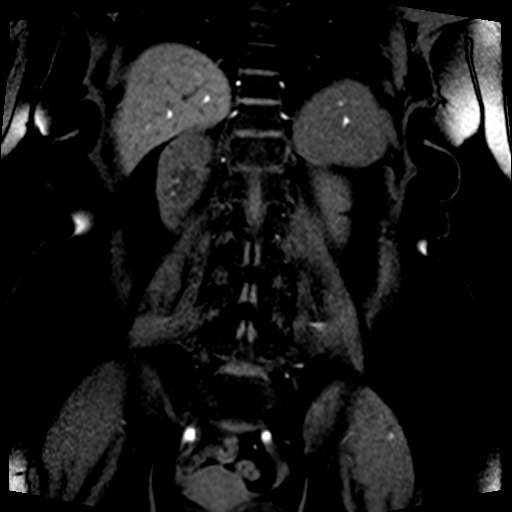
[im 19/19]
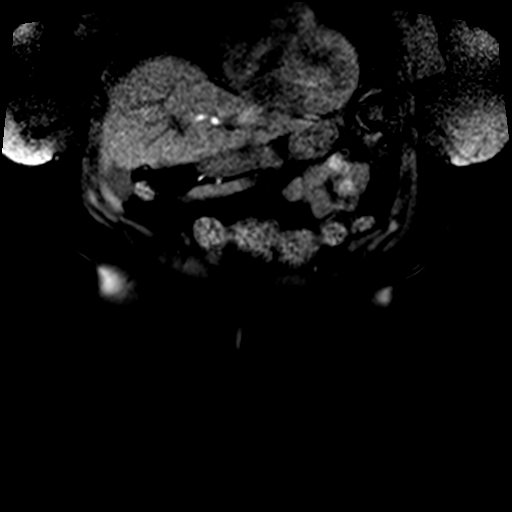

[Series 9: T2 · axial · 6.0mm · 0.94mm/px · z∈[+62,+191]mm · 2 of 19 slices shown]
[im 1/19]
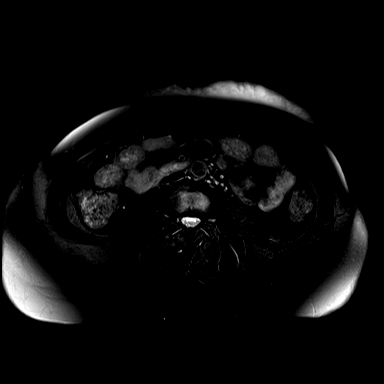
[im 19/19]
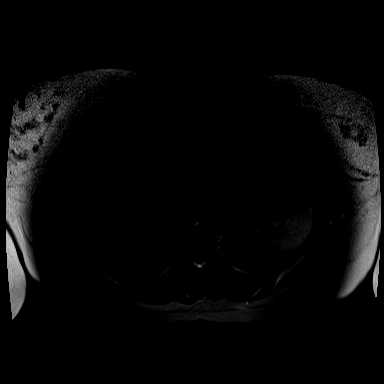

[Series 11: T1 dynamic fat-sat · axial · non-contrast · 3.5mm · 0.74mm/px · z∈[+57,+208]mm · 5 of 44 slices shown]
[im 1/44]
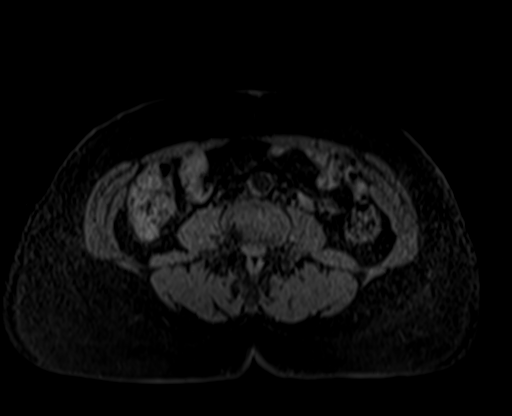
[im 11/44]
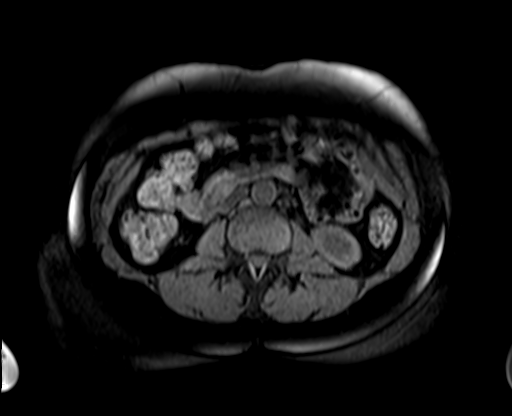
[im 22/44]
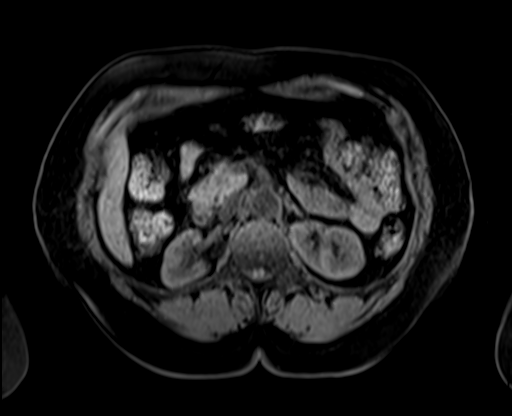
[im 33/44]
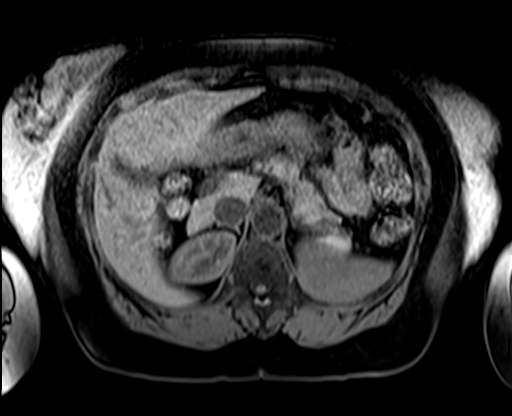
[im 44/44]
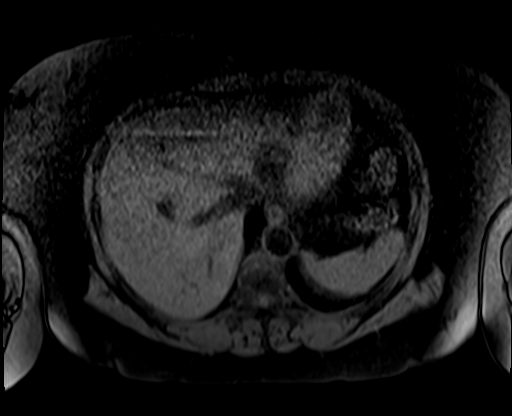

[Series 13: fl3d_cor · coronal · 1.2mm · 0.62mm/px · 5 of 88 slices shown]
[im 1/88]
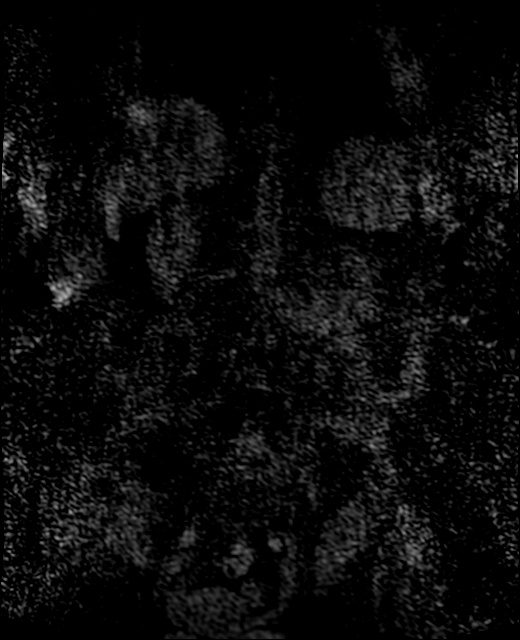
[im 11/88]
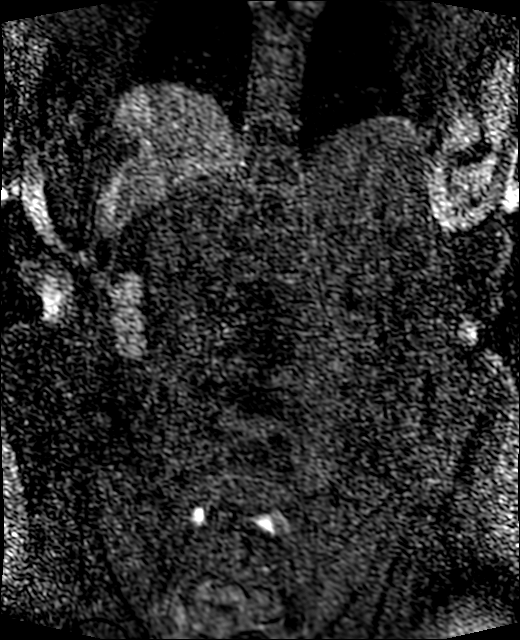
[im 22/88]
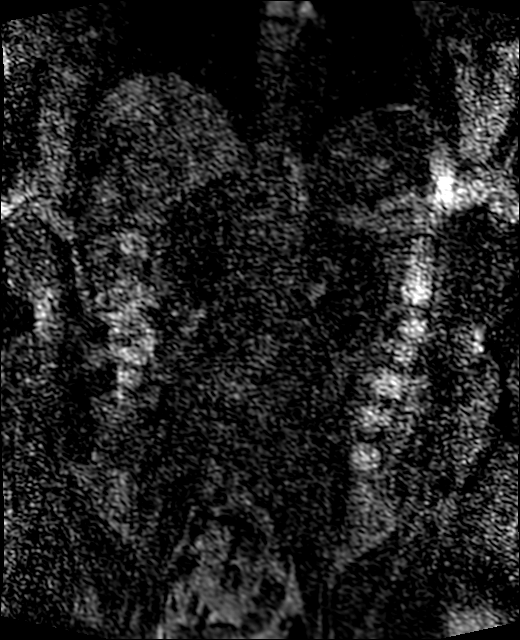
[im 33/88]
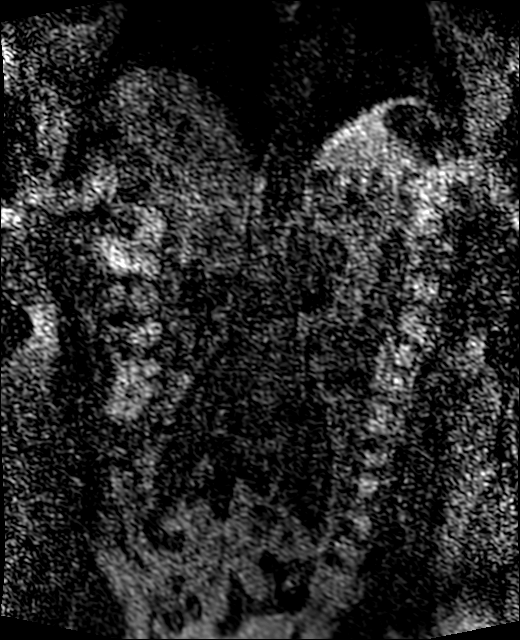
[im 55/88]
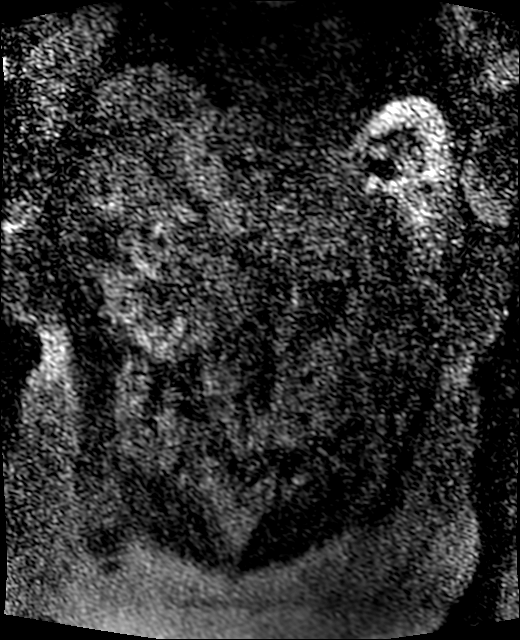

[19 of 48 positions shown; findings below may reference images not displayed]

FINDINGS: Nonvascular:

Unremarkable appearance of liver, spleen, pancreas, adrenal glands,
small bowel/colon, gallbladder, intrahepatic and extrahepatic
biliary system.

Unremarkable appearance of the bilateral kidneys with no focal
lesions, no hydronephrosis.

Musculoskeletal:

Hemangioma of the right aspect of L3.  Hemangioma of T9.

Vascular:

Aorta:

Course caliber and contour of the visualized lower thoracic and the
abdominal aorta unremarkable. No aneurysm, dissection flap, or wall
thickening. No periaortic fluid.

Mesenteric vessels:

Origin of the celiac artery, superior mesenteric artery, bilateral
renal arteries patent with flow signal/enhancement. Origin of the
inferior mesenteric artery appears patent.

Stenoses of the origin of the bilateral renal arteries, though these
are difficult to quantify given the field of view. Minimal
calcifications in the region of the origin of the left and right
renal artery on the prior CT.

Unremarkable course caliber and contour of the visualized iliac
system. Bilateral hypogastric arteries are patent. No evidence of
aneurysm, dissection flap, occlusion.

Unremarkable appearance of the iliac veins, IVC, super hepatic IVC.
Unremarkable appearance of the hepatic veins and portal system.
Portal vein is patent, as well as the splenic vein, superior
mesenteric vein, inferior mesenteric vein.
IMPRESSION: No evidence of mesenteric artery occlusion or significant stenosis.
Origin of the celiac artery, superior mesenteric artery, and
inferior mesenteric artery are patent.

Single bilateral renal arteries. There appears to be stenoses at the
origin of both the left and right renal artery. Although
atherosclerotic disease is the most likely cause of renal artery
narrowing, particularly at the origins, there is a relative paucity
of calcifications on the prior CT. Other differential diagnoses may
be considered including fibromuscular dysplasia. If further
evaluation is warranted, formal angiogram would be the most
sensitive and specific test.

Note that if fibromuscular dysplasia is considered, carotid duplex
imaging would also be recommended as the cerebrovascular arteries
are frequently involved with FMD.

These results were called by telephone at the time of interpretation
on 03/17/2015 at [DATE] to Dr. NIVIRUS DATABEX , who verbally
acknowledged these results.

## 2016-08-04 ENCOUNTER — Other Ambulatory Visit: Payer: Self-pay | Admitting: Family Medicine

## 2016-08-04 ENCOUNTER — Other Ambulatory Visit (HOSPITAL_COMMUNITY)
Admission: RE | Admit: 2016-08-04 | Discharge: 2016-08-04 | Disposition: A | Discharge status: Home / Self Care | Payer: Medicare Other | Source: Ambulatory Visit | Attending: Family Medicine | Admitting: Family Medicine

## 2016-08-04 DIAGNOSIS — Z01419 Encounter for gynecological examination (general) (routine) without abnormal findings: Secondary | ICD-10-CM | POA: Diagnosis present

## 2016-08-04 DIAGNOSIS — Z1151 Encounter for screening for human papillomavirus (HPV): Secondary | ICD-10-CM | POA: Diagnosis present

## 2016-08-04 DIAGNOSIS — R42 Dizziness and giddiness: Secondary | ICD-10-CM

## 2016-08-04 DIAGNOSIS — I7090 Unspecified atherosclerosis: Secondary | ICD-10-CM

## 2016-08-05 ENCOUNTER — Other Ambulatory Visit: Payer: Self-pay | Admitting: Family Medicine

## 2016-08-05 DIAGNOSIS — R002 Palpitations: Secondary | ICD-10-CM

## 2016-08-06 LAB — CYTOLOGY - PAP

## 2016-08-24 ENCOUNTER — Other Ambulatory Visit (HOSPITAL_COMMUNITY): Payer: Medicare Other

## 2016-08-25 ENCOUNTER — Other Ambulatory Visit: Payer: Medicare Other

## 2016-10-04 ENCOUNTER — Other Ambulatory Visit (HOSPITAL_COMMUNITY): Payer: Medicare Other

## 2016-10-05 ENCOUNTER — Other Ambulatory Visit: Payer: Medicare Other

## 2016-10-26 ENCOUNTER — Other Ambulatory Visit (HOSPITAL_COMMUNITY): Payer: Medicare Other

## 2016-11-25 ENCOUNTER — Other Ambulatory Visit: Payer: Medicare Other

## 2016-11-26 ENCOUNTER — Other Ambulatory Visit (HOSPITAL_COMMUNITY): Payer: Medicare Other

## 2017-01-21 ENCOUNTER — Ambulatory Visit (HOSPITAL_COMMUNITY): Payer: Medicare Other | Attending: Cardiovascular Disease

## 2017-01-21 ENCOUNTER — Other Ambulatory Visit: Payer: Self-pay

## 2017-01-21 ENCOUNTER — Encounter (INDEPENDENT_AMBULATORY_CARE_PROVIDER_SITE_OTHER): Payer: Self-pay

## 2017-01-21 DIAGNOSIS — R002 Palpitations: Secondary | ICD-10-CM | POA: Insufficient documentation

## 2017-01-21 DIAGNOSIS — I340 Nonrheumatic mitral (valve) insufficiency: Secondary | ICD-10-CM | POA: Insufficient documentation

## 2017-01-21 DIAGNOSIS — I071 Rheumatic tricuspid insufficiency: Secondary | ICD-10-CM | POA: Diagnosis not present

## 2017-01-21 DIAGNOSIS — I10 Essential (primary) hypertension: Secondary | ICD-10-CM | POA: Insufficient documentation

## 2017-01-21 DIAGNOSIS — E785 Hyperlipidemia, unspecified: Secondary | ICD-10-CM | POA: Insufficient documentation

## 2017-01-21 DIAGNOSIS — I371 Nonrheumatic pulmonary valve insufficiency: Secondary | ICD-10-CM | POA: Diagnosis not present

## 2017-01-26 ENCOUNTER — Other Ambulatory Visit: Payer: Medicare Other

## 2017-01-31 ENCOUNTER — Ambulatory Visit
Admission: RE | Admit: 2017-01-31 | Discharge: 2017-01-31 | Disposition: A | Discharge status: Home / Self Care | Payer: Medicare Other | Source: Ambulatory Visit | Attending: Family Medicine | Admitting: Family Medicine

## 2017-01-31 DIAGNOSIS — R42 Dizziness and giddiness: Secondary | ICD-10-CM

## 2017-01-31 DIAGNOSIS — I7090 Unspecified atherosclerosis: Secondary | ICD-10-CM

## 2017-02-02 ENCOUNTER — Other Ambulatory Visit: Payer: Self-pay | Admitting: Family Medicine

## 2017-02-02 DIAGNOSIS — E041 Nontoxic single thyroid nodule: Secondary | ICD-10-CM

## 2017-02-04 ENCOUNTER — Other Ambulatory Visit: Payer: Medicare Other

## 2017-02-07 ENCOUNTER — Ambulatory Visit
Admission: RE | Admit: 2017-02-07 | Discharge: 2017-02-07 | Disposition: A | Discharge status: Home / Self Care | Payer: Medicare Other | Source: Ambulatory Visit | Attending: Family Medicine | Admitting: Family Medicine

## 2017-02-07 DIAGNOSIS — E041 Nontoxic single thyroid nodule: Secondary | ICD-10-CM

## 2017-02-10 ENCOUNTER — Telehealth: Payer: Self-pay

## 2017-02-10 NOTE — Telephone Encounter (Signed)
Sent notes to scheduling 

## 2017-03-04 ENCOUNTER — Ambulatory Visit: Payer: Medicare Other | Admitting: Cardiovascular Disease

## 2017-04-28 ENCOUNTER — Ambulatory Visit: Payer: Medicare Other | Admitting: Cardiovascular Disease

## 2017-08-02 ENCOUNTER — Ambulatory Visit: Payer: Medicare Other | Admitting: Cardiovascular Disease

## 2017-08-09 ENCOUNTER — Other Ambulatory Visit: Payer: Self-pay | Admitting: Family Medicine

## 2017-08-09 DIAGNOSIS — E041 Nontoxic single thyroid nodule: Secondary | ICD-10-CM

## 2017-08-17 ENCOUNTER — Other Ambulatory Visit: Payer: Medicare Other

## 2017-08-30 ENCOUNTER — Other Ambulatory Visit: Payer: Medicare Other

## 2017-09-08 ENCOUNTER — Other Ambulatory Visit: Payer: Self-pay | Admitting: Family Medicine

## 2017-09-08 DIAGNOSIS — Z1231 Encounter for screening mammogram for malignant neoplasm of breast: Secondary | ICD-10-CM

## 2017-09-15 ENCOUNTER — Ambulatory Visit: Payer: Medicare Other

## 2017-09-24 IMAGING — US US CAROTID DUPLEX BILAT
1 series · 13 of 24 positions shown · non-contrast
Comparison: None.

CLINICAL DATA: Dizziness

EXAM:
BILATERAL CAROTID DUPLEX ULTRASOUND
TECHNIQUE: Gray scale imaging, color Doppler and duplex ultrasound were
performed of bilateral carotid and vertebral arteries in the neck.

[Series 1: us carotid duplex bilat · 0.05mm/px · 13 of 56 slices shown]
[im 1/56]
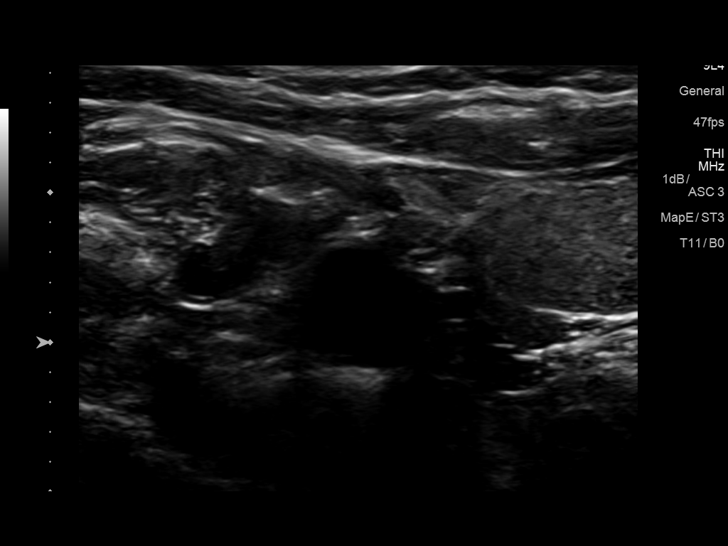
[im 5/56]
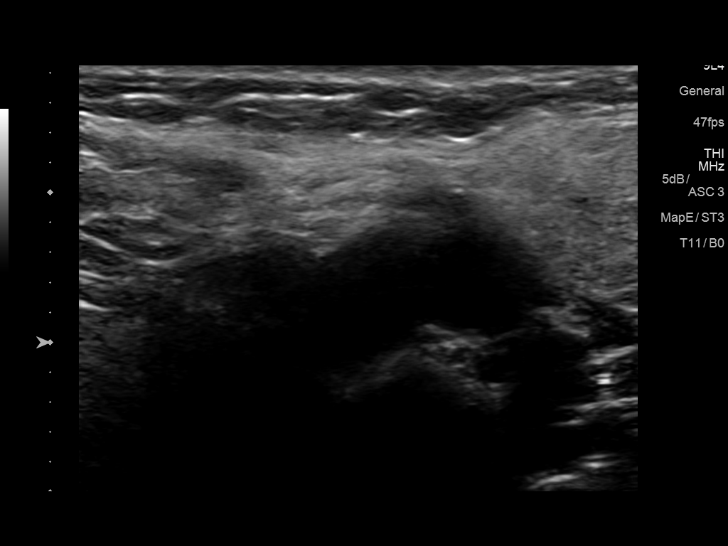
[im 10/56]
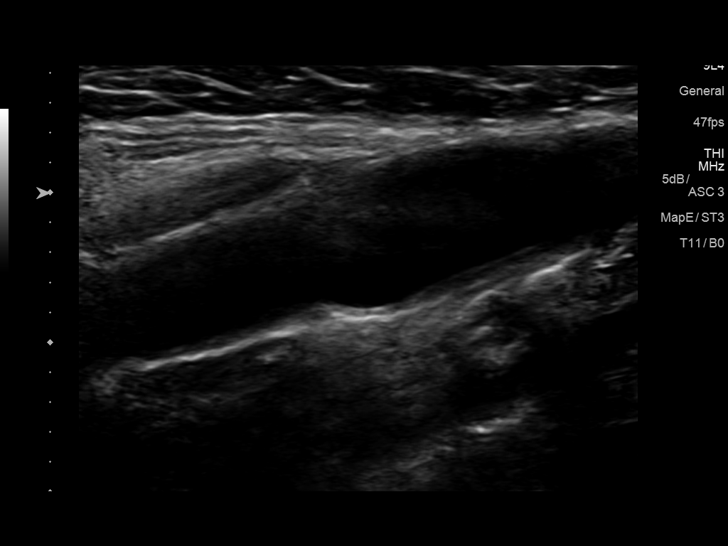
[im 15/56]
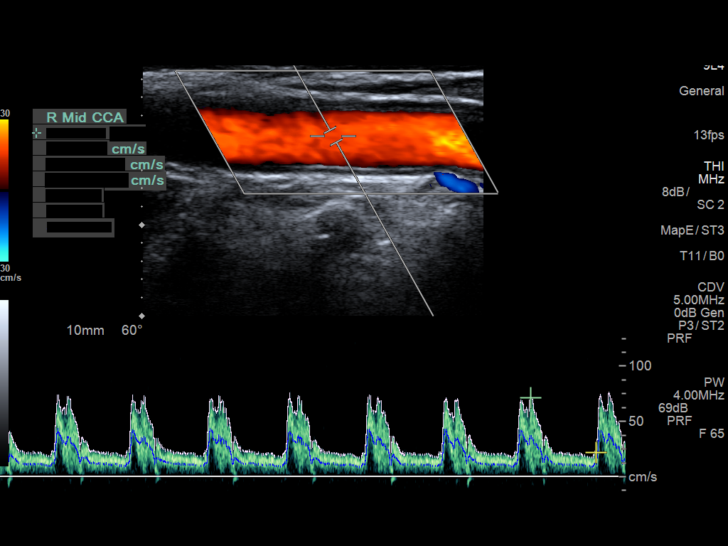
[im 20/56]
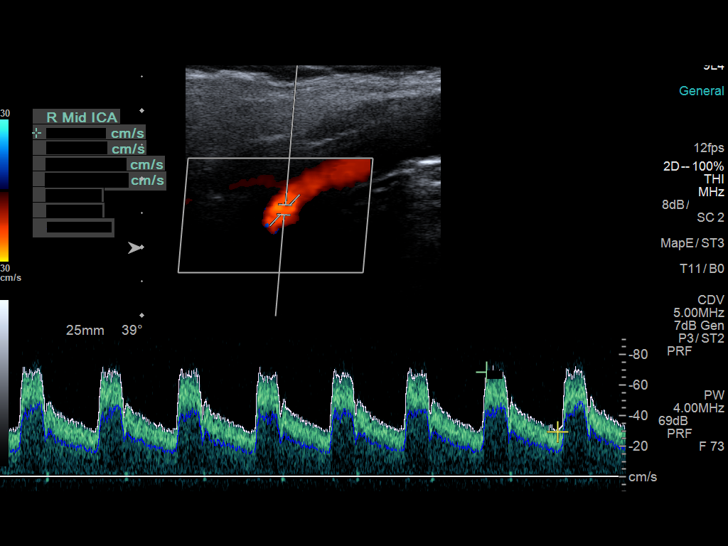
[im 24/56]
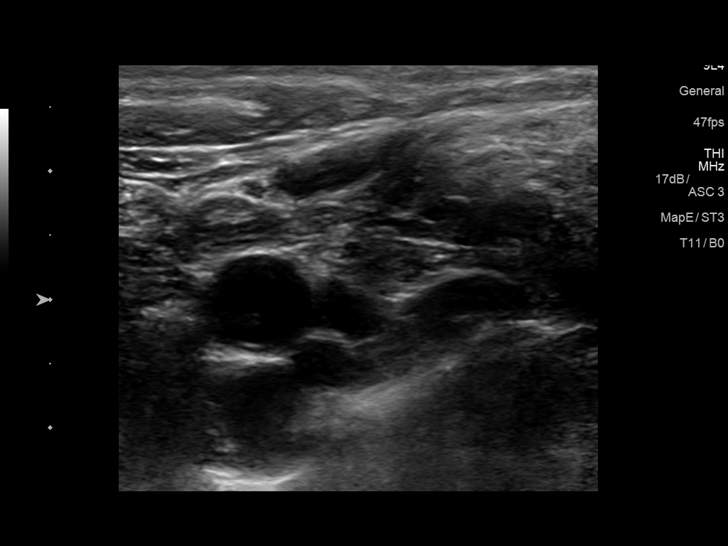
[im 29/56]
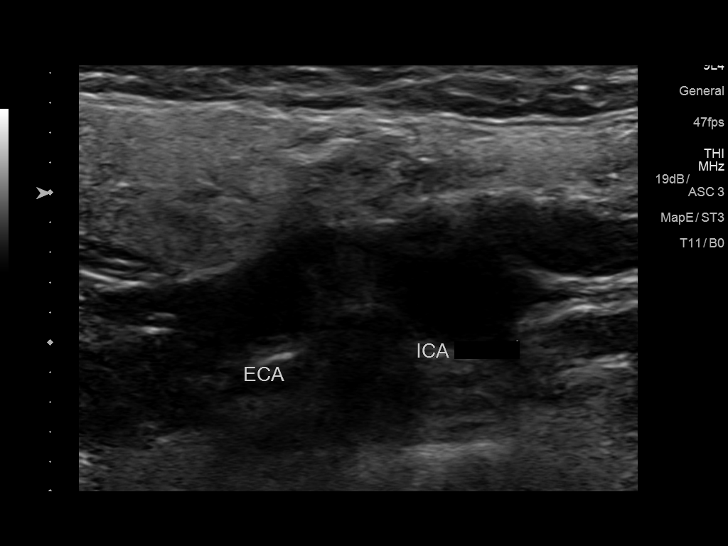
[im 32/56]
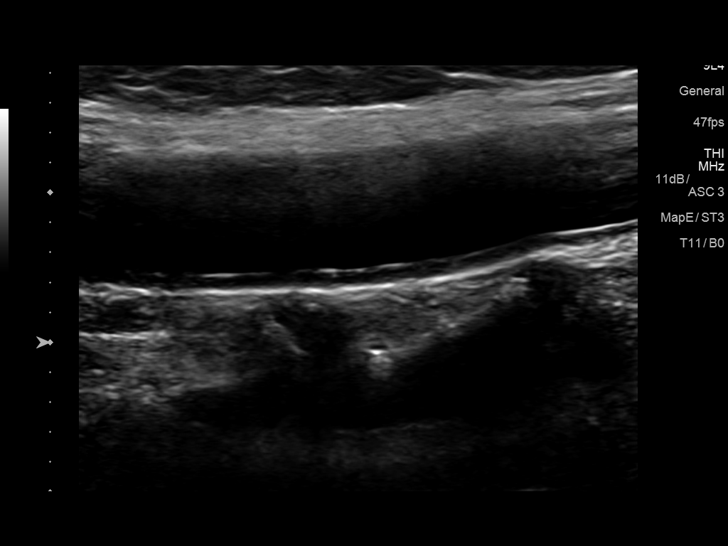
[im 36/56]
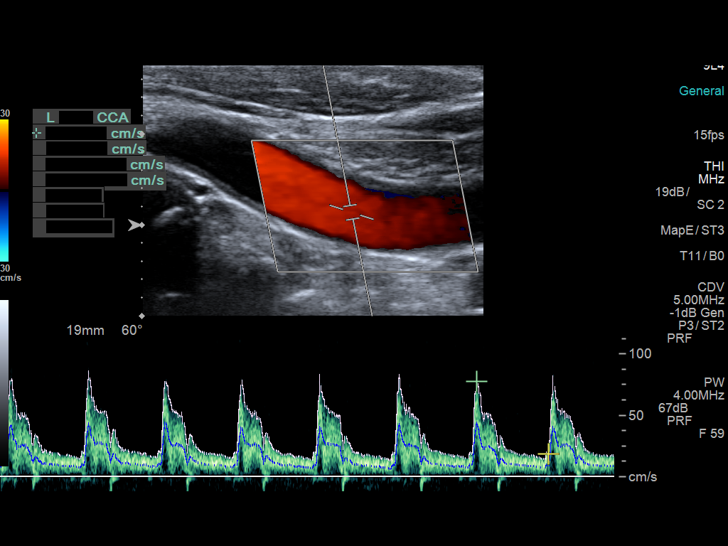
[im 41/56]
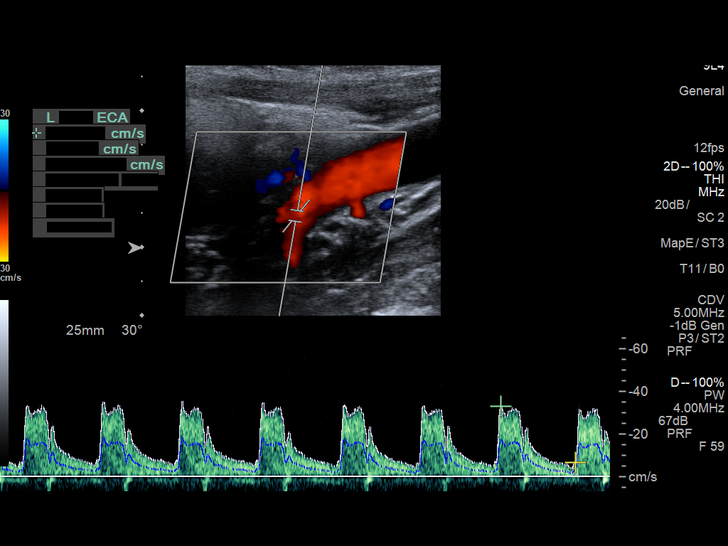
[im 46/56]
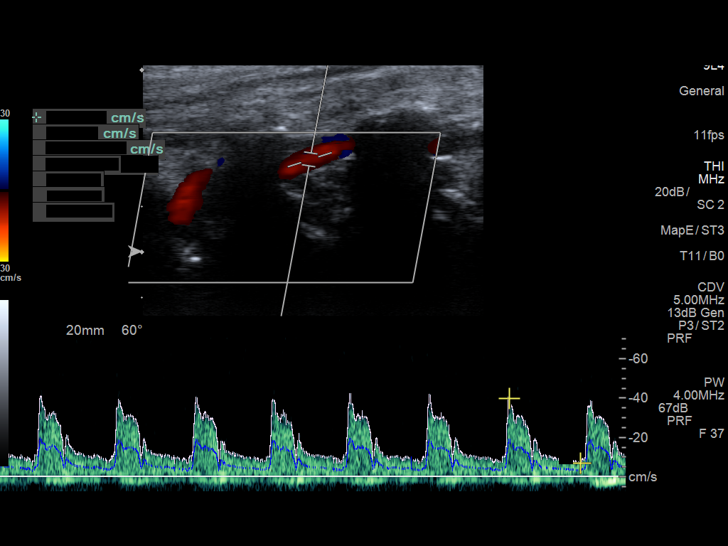
[im 51/56]
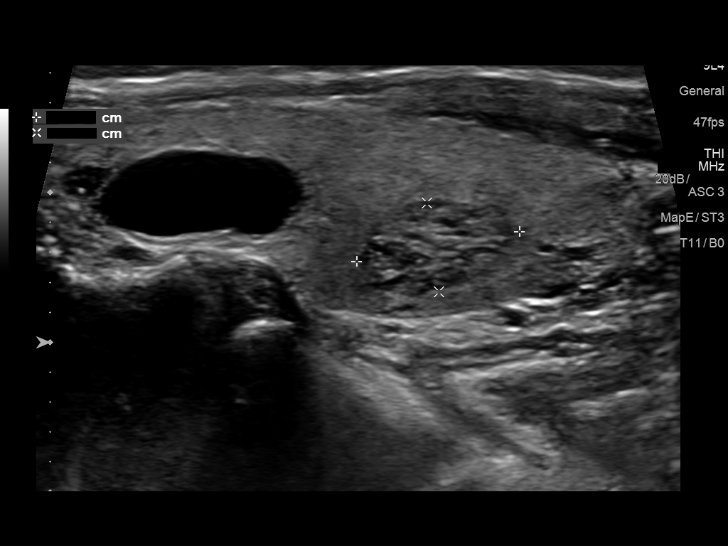
[im 56/56]
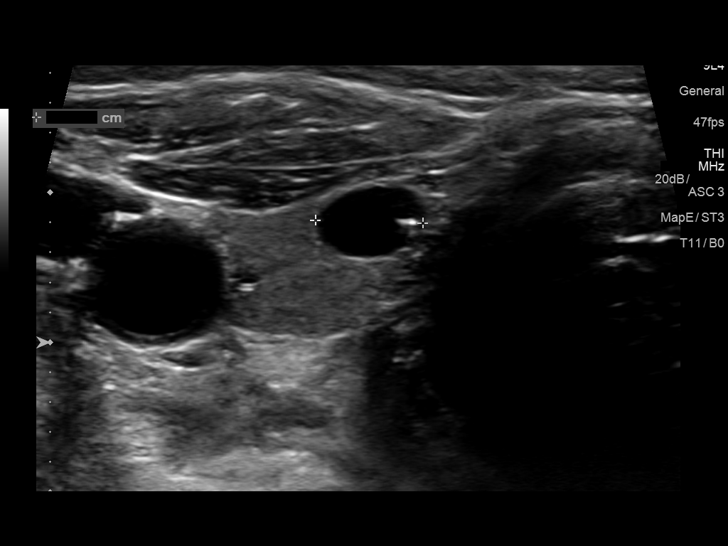

[13 of 24 positions shown; findings below may reference images not displayed]

FINDINGS: Criteria: Quantification of carotid stenosis is based on velocity
parameters that correlate the residual internal carotid diameter
with NASCET-based stenosis levels, using the diameter of the distal
internal carotid lumen as the denominator for stenosis measurement.

The following velocity measurements were obtained:

RIGHT

ICA:  70 cm/sec

CCA:  96 cm/sec

SYSTOLIC ICA/CCA RATIO:

DIASTOLIC ICA/CCA RATIO:

ECA:  62 cm/sec

LEFT

ICA:  99 cm/sec

CCA:  87 cm/sec

SYSTOLIC ICA/CCA RATIO:

DIASTOLIC ICA/CCA RATIO:

ECA:  33 cm/sec

RIGHT CAROTID ARTERY: Little if any plaque in the bulb. Low
resistance internal carotid Doppler pattern.

RIGHT VERTEBRAL ARTERY:  Antegrade.

LEFT CAROTID ARTERY: Little if any plaque in the bulb. Low
resistance internal carotid Doppler pattern.

LEFT VERTEBRAL ARTERY:  Antegrade.

Additional findings: There is a benign-appearing cyst in the left
lobe of the thyroid gland measuring up to 1.4 cm. There is also an
isoechoic nodule in the left lobe measuring up to 1.1 cm with benign
features. A complete thyroid ultrasound was not performed.
IMPRESSION: Less than 50% stenosis in the right and left internal carotid
artery's.

There is a cyst and 1.1 cm solid nodule in the left lobe of the
thyroid gland which have benign features. A complete thyroid
ultrasound was not performed and other nodules may be present.
Consider dedicated thyroid ultrasound.

## 2017-10-01 IMAGING — US US THYROID
1 series · 13 of 25 positions shown · non-contrast
Comparison: Carotid Doppler ultrasound- 01/31/2017

CLINICAL DATA: Incidental on US. Thyroid nodules seen on preceding
carotid Doppler ultrasound.

EXAM:
THYROID ULTRASOUND
TECHNIQUE: Ultrasound examination of the thyroid gland and adjacent soft
tissues was performed.

[Series 1: us thyroid · 0.07mm/px · 13 of 67 slices shown]
[im 1/67]
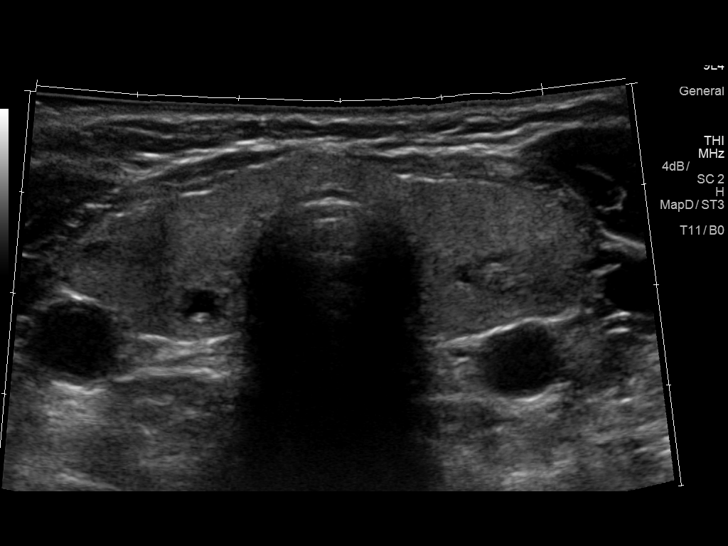
[im 6/67]
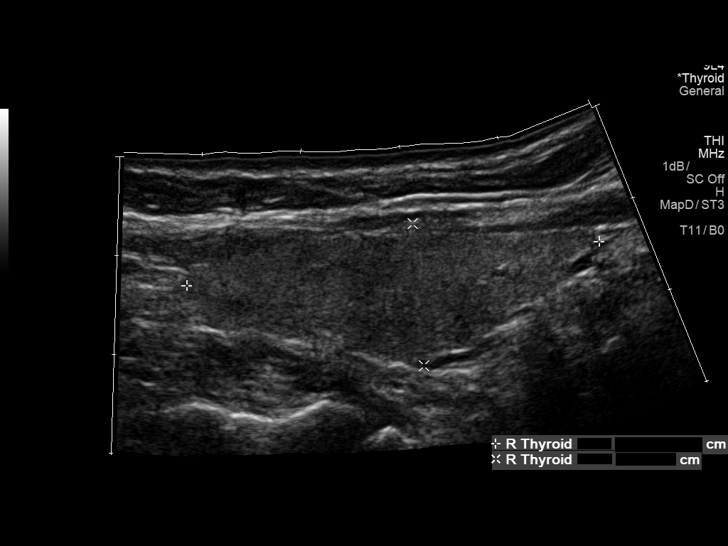
[im 12/67]
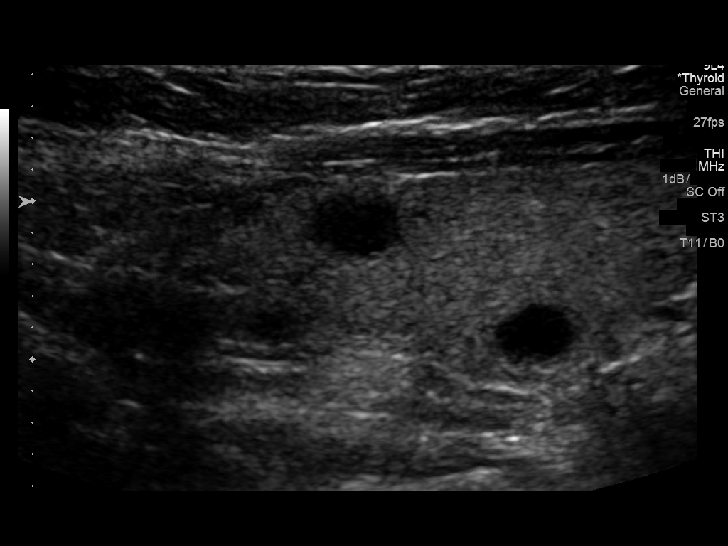
[im 17/67]
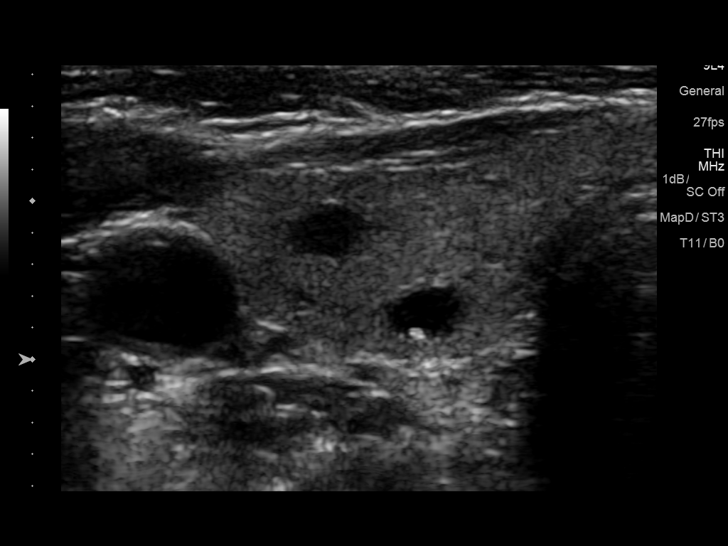
[im 23/67]
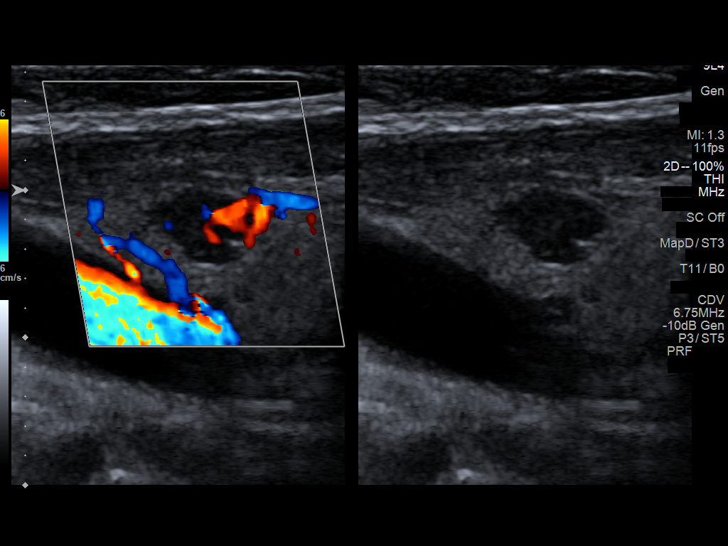
[im 28/67]
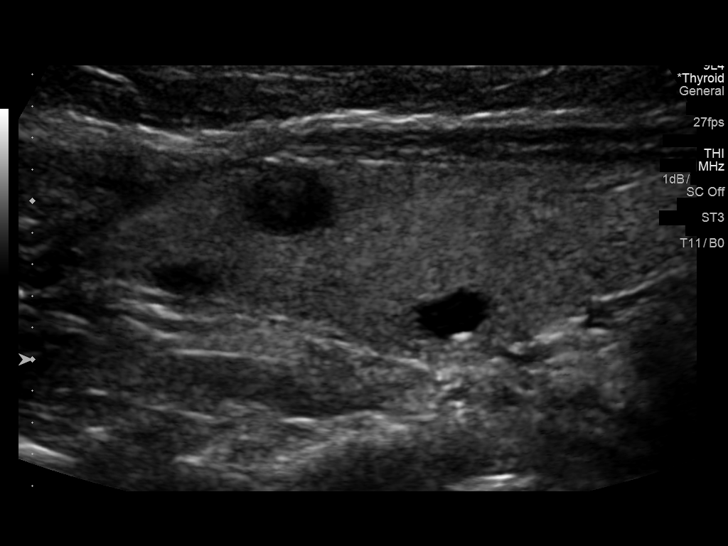
[im 34/67]
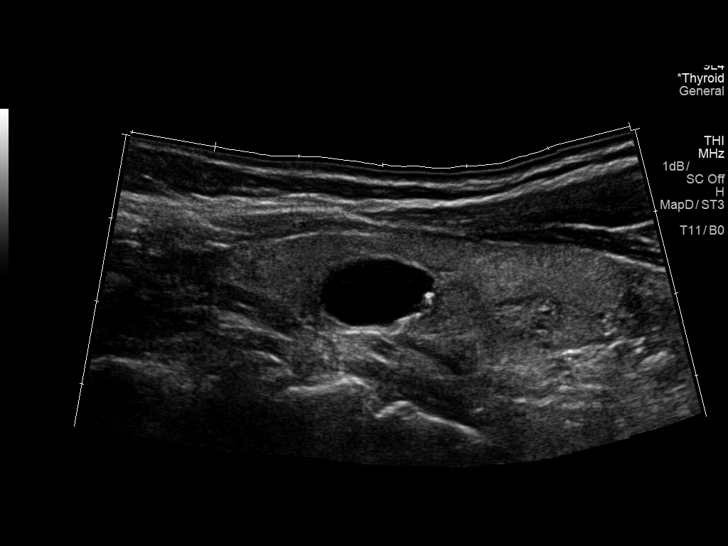
[im 39/67]
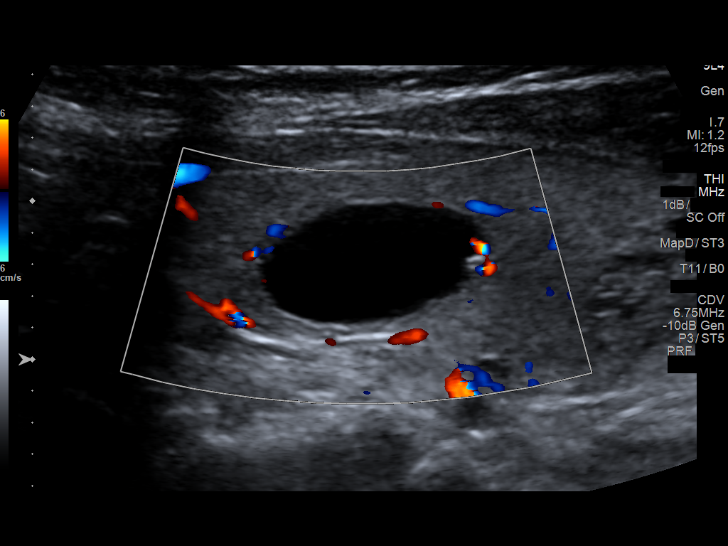
[im 45/67]
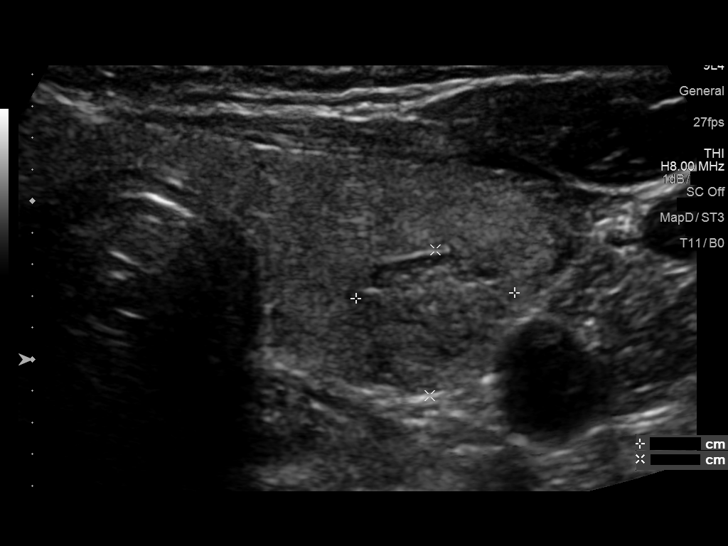
[im 50/67]
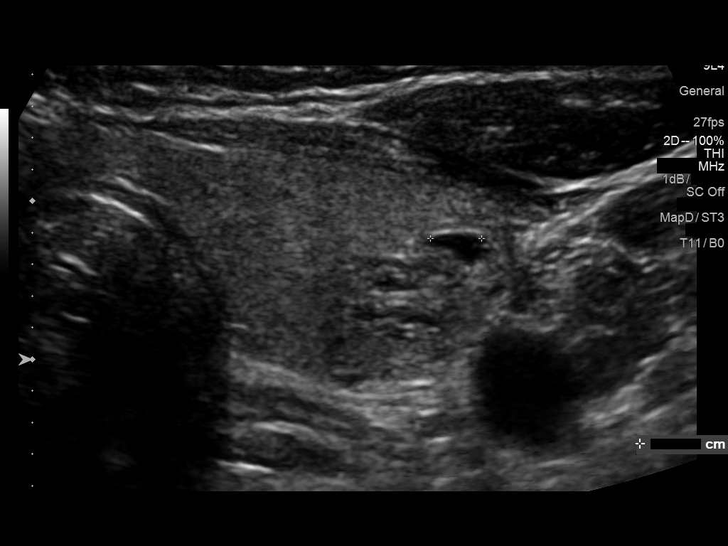
[im 56/67]
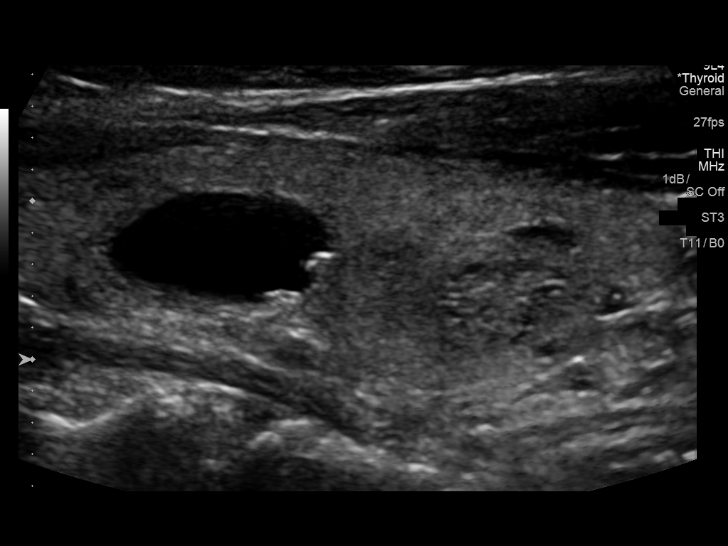
[im 61/67]
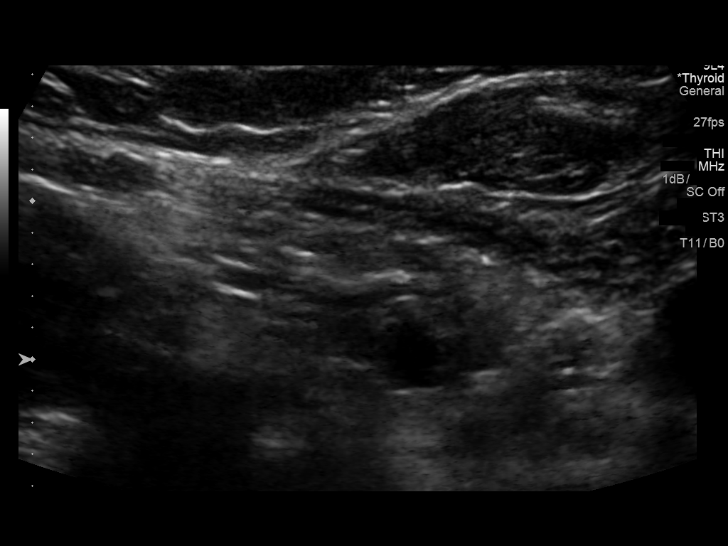
[im 67/67]
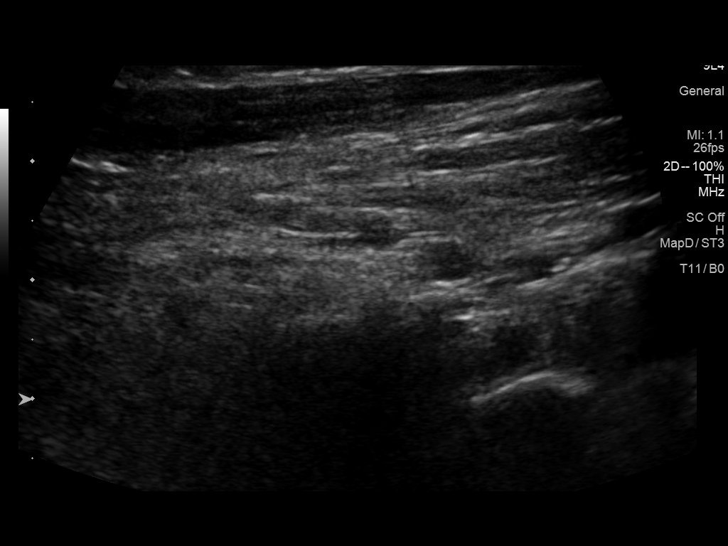

[13 of 25 positions shown; findings below may reference images not displayed]

FINDINGS: Parenchymal Echotexture: Mildly heterogenous

Isthmus: Normal in size measuring 0.4 cm in diameter

Right lobe: Normal in size measuring 4.2 x 1.4 x 1.9 cm

Left lobe: Normal in size measuring 4.7 x 1.6 x 2.0 cm

_________________________________________________________

Estimated total number of nodules >/= 1 cm: 2

Number of spongiform nodules >/=  2 cm not described below (TR1): 0

Number of mixed cystic and solid nodules >/= 1.5 cm not described
below (TR2): 0

Nodule # 1:

Location: Left; Superior - this nodule correlates with one of the
nodules seen on preceding carotid Doppler ultrasound.

Maximum size: 1.4 cm; Other 2 dimensions: 0.8 x 1.0 cm

Composition: cystic/almost completely cystic (0)

Echogenicity: anechoic (0)

Shape: not taller-than-wide (0)

Margins: smooth (0)

Echogenic foci: large comet-tail artifacts (0)

ACR TI-RADS total points: 0.

ACR TI-RADS risk category: TR1 (0-1 points).

ACR TI-RADS recommendations:

This nodule does NOT meet TI-RADS criteria for biopsy or dedicated
follow-up.

Nodule # 2:

Location: Left; Mid - this nodule correlates with one of the nodules
seen on preceding carotid Doppler ultrasound.

Maximum size: 1.1 cm; Other 2 dimensions: 1.0 x 0.9 cm

Composition: spongiform (0)

Echogenicity: isoechoic (1)

Shape: not taller-than-wide (0)

Margins: ill-defined (0)

Echogenic foci: none (0)

ACR TI-RADS total points: 1.

ACR TI-RADS risk category: TR1 (0-1 points).

ACR TI-RADS recommendations:

This nodule does NOT meet TI-RADS criteria for biopsy or dedicated
follow-up.

There are several punctate (sub 8 mm) anechoic cystic nodules
scattered within the right lobe of the thyroid several which contain
eccentric punctate echogenic calcifications with ring down artifact
compatible with colloid. None of the discretely measured right-sided
thyroid nodules meet imaging criteria to recommend percutaneous
sampling or dedicated follow-up.
IMPRESSION: Findings suggestive multinodular goiter. None of the discretely
measured thyroid nodules meet ultrasound criteria to recommend
percutaneous sampling or dedicated follow-up.

The above is in keeping with the ACR TI-RADS recommendations - [HOSPITAL] 4655;[DATE].

## 2017-10-04 ENCOUNTER — Ambulatory Visit
Admission: RE | Admit: 2017-10-04 | Discharge: 2017-10-04 | Disposition: A | Discharge status: Home / Self Care | Payer: Medicare Other | Source: Ambulatory Visit | Attending: Family Medicine | Admitting: Family Medicine

## 2017-10-04 DIAGNOSIS — Z1231 Encounter for screening mammogram for malignant neoplasm of breast: Secondary | ICD-10-CM

## 2017-10-14 ENCOUNTER — Ambulatory Visit: Payer: Medicare Other

## 2017-10-25 ENCOUNTER — Ambulatory Visit (INDEPENDENT_AMBULATORY_CARE_PROVIDER_SITE_OTHER): Payer: Medicare Other | Admitting: Cardiovascular Disease

## 2017-10-25 ENCOUNTER — Encounter: Payer: Self-pay | Admitting: Cardiovascular Disease

## 2017-10-25 VITALS — BP 177/86 | HR 73 | Ht 61.0 in | Wt 160.0 lb

## 2017-10-25 DIAGNOSIS — E782 Mixed hyperlipidemia: Secondary | ICD-10-CM | POA: Diagnosis not present

## 2017-10-25 DIAGNOSIS — I10 Essential (primary) hypertension: Secondary | ICD-10-CM | POA: Diagnosis not present

## 2017-10-25 DIAGNOSIS — R002 Palpitations: Secondary | ICD-10-CM | POA: Diagnosis not present

## 2017-10-25 MED ORDER — LOSARTAN POTASSIUM 50 MG PO TABS: 50.0000 mg | ORAL_TABLET | Freq: Every day | ORAL | 3 refills | Status: DC

## 2017-10-25 MED ORDER — METOPROLOL TARTRATE 25 MG PO TABS: 25.0000 mg | ORAL_TABLET | Freq: Two times a day (BID) | ORAL | 3 refills | Status: DC

## 2017-10-25 NOTE — Patient Instructions (Addendum)
Medication Instructions:  Your physician has recommended you make the following change in your medication:  INCREASE Losartan (Cozaar) to 50 mg once daily START Metoprolol (Lopressor) 25 mg twice daily   Labwork: Your physician recommends that you return for lab work in: 3 weeks for basic metabolic panel   Testing/Procedures: Your physician has recommended that you wear an event monitor. Event monitors are medical devices that record the heart's electrical activity. Doctors most often [place] these monitors to diagnose arrhythmias. Arrhythmias are problems with the speed or rhythm of the heartbeat. The monitor is a small, portable device. You can wear one while you do your normal daily activities. This is usually used to diagnose what is causing palpitations/syncope (passing out).    Follow-Up: Your physician recommends that you schedule a follow-up appointment in: 3 months with Dr. Acie Fredrickson   If you need a refill on your cardiac medications before your next appointment, please call your pharmacy.   Thank you for choosing [facility]! Christen Bame, RN [phone]

## 2017-10-25 NOTE — Progress Notes (Signed)
Cardiology Office Note:    Date:  10/25/2017   ID:  Katie Potts, DOB June 01, 1951, MRN [id number]  PCP:  Maurice Small, MD  Cardiologist:  Mertie Moores, MD    Referring MD: Maurice Small, MD   Problem List 1. Essential HTN 2. Hyperlipidemia  3.  Chronic diastolic CHF ( grade 1 Diastolic dysfunction )  4.  Hyperlipidemia -   5. Family hx of CAD   Chief Complaint  Patient presents with  . Palpitations    History of Present Illness:    Katie Potts is a 66 y.o. female with a hx of HTN,  Who is referred by Dr. Justin Mend for further eval of palpitations  She has had these palpitations for years.. These of course occasionally.  She might have 2 a day..  These typically last 3-4 minutes.  These are associated with the with some shortness of breath and difficulty to catch her breath.  Also associated with some dizziness and chest discomfort.  These cause her to stop whatever she is doing.  They seem to be more common at night.  She is noted that if she is walking at the time that these occur, easiness will force her to stop walking and make her sit on the ground.    Her blood pressure has been high for frequently.  She has associated headaches. She avoids eating excessive salt.but does admit to eating some fast foods and fried foods on occasion.  She has a history of hyperlipidemia.  She is currently on Pravachol but this causes her to have a rash.  She is been breaking the pill in half.  Her lipid levels are still high.  On disability -previously worked at [employer]    Past Medical History:  Diagnosis Date  . Asthma   . Depression    MAJOR DEPRESSION, DR. KAUR;PSYCHIATRIST  . Hyperlipidemia     Past Surgical History:  Procedure Laterality Date  . BREAST SURGERY    . REDUCTION MAMMAPLASTY Bilateral    30 years ago    Current Medications: Current Meds  Medication Sig  . albuterol (PROVENTIL HFA;VENTOLIN HFA) 108 (90 BASE) MCG/ACT inhaler Inhale 2 puffs into the lungs  every 6 (six) hours as needed for wheezing or shortness of breath.  . clonazePAM (KLONOPIN) 1 MG tablet Take 4 mg by mouth at bedtime.   . DULoxetine (CYMBALTA) 60 MG capsule Take 60 mg daily by mouth.  . pravastatin (PRAVACHOL) 40 MG tablet Take 40 mg once a week by mouth.  . zolpidem (AMBIEN) 5 MG tablet Take 10 mg by mouth at bedtime as needed. For sleep  . [DISCONTINUED] FLUoxetine (PROZAC) 40 MG capsule Take 40 mg by mouth 2 (two) times daily.    . [DISCONTINUED] hydrochlorothiazide (HYDRODIURIL) 25 MG tablet Take 1 tablet (25 mg total) by mouth daily.  . [DISCONTINUED] losartan (COZAAR) 25 MG tablet Take 25 mg daily by mouth.  . [DISCONTINUED] triamcinolone cream (KENALOG) 0.1 % Apply 1 application topically.      Allergies:   Seroquel [quetiapine fumarate]   Social History   Socioeconomic History  . Marital status: Single    Spouse name: None  . Number of children: None  . Years of education: None  . Highest education level: None  Social Needs  . Financial resource strain: None  . Food insecurity - worry: None  . Food insecurity - inability: None  . Transportation needs - medical: None  . Transportation needs - non-medical: None  Occupational History  .  None  Tobacco Use  . Smoking status: Never Smoker  . Smokeless tobacco: Never Used  Substance and Sexual Activity  . Alcohol use: No    Alcohol/week: 0.0 oz  . Drug use: No  . Sexual activity: Not Currently  Other Topics Concern  . None  Social History Narrative  . None     Family History: The patient's family history includes AAA (abdominal aortic aneurysm) in her father; Cancer in her father and mother; Colon cancer in her paternal uncle; Deep vein thrombosis in her father; Diabetes in her father; Heart attack in her brother; Heart disease in her father; Hyperlipidemia in her father, mother, sister, and sister; Hypertension in her brother, father, mother, sister, and sister; Mental illness in her sister; Pancreatic  cancer in her mother; Peripheral vascular disease in her father; Varicose Veins in her mother. ROS:   Please see the history of present illness.     All other systems reviewed and are negative.  EKGs/Labs/Other Studies Reviewed:    The following studies were reviewed today:   EKG:  EKG is  ordered today.  The ekg ordered today demonstrates  NSR  At 76.  No St or T wave changes   Recent Labs: No results found for requested labs within last 8760 hours.  Recent Lipid Panel No results found for: CHOL, TRIG, HDL, CHOLHDL, VLDL, LDLCALC, LDLDIRECT  Physical Exam:    VS:  BP (!) 177/86   Pulse 73   Ht 5\' 1"  (1.549 m)   Wt 160 lb (72.6 kg)   SpO2 99%   BMI 30.23 kg/m     Wt Readings from Last 3 Encounters:  10/25/17 160 lb (72.6 kg)  06/04/15 158 lb (71.7 kg)  11/05/11 150 lb (68 kg)     GEN:  Well nourished, well developed in no acute distress HEENT: Normal NECK: No JVD; No carotid bruits LYMPHATICS: No lymphadenopathy CARDIAC: RR, no murmurs, rubs, gallops RESPIRATORY:  Clear to auscultation without rales, wheezing or rhonchi  ABDOMEN: Soft, non-tender, non-distended MUSCULOSKELETAL:  No edema; No deformity  SKIN: Warm and dry NEUROLOGIC:  Alert and oriented x 3 PSYCHIATRIC:  Normal affect   ASSESSMENT:    1. Heart palpitations   2. Essential hypertension    PLAN:    In order of problems listed above:  1. Palpitations:    Katie Potts is having palpitations .  These palpitations last for several minutes and are concerning for atrial fibrillation or perhaps supraventricular tachycardia.  They last a bit longer than I would expect for premature ventricular contractions.  Her potassium level is normal.  Will add metoprolol 25 BID to her medicines 30 day monitor  Echo has been done - shows normal LV function   2.  Essential HTN:   Add metoprolol 25 bid Increase Losartan to 50 mg a day   Medication Adjustments/Labs and Tests Ordered: Current medicines are reviewed at  length with the patient today.  Concerns regarding medicines are outlined above.  Orders Placed This Encounter  Procedures  . Basic Metabolic Panel (BMET)  . Cardiac event monitor  . EKG 12-Lead   Meds ordered this encounter  Medications  . losartan (COZAAR) 50 MG tablet    Sig: Take 1 tablet (50 mg total) daily by mouth.    Dispense:  90 tablet    Refill:  3  . metoprolol tartrate (LOPRESSOR) 25 MG tablet    Sig: Take 1 tablet (25 mg total) 2 (two) times daily by mouth.  Dispense:  180 tablet    Refill:  3    Signed, Mertie Moores, MD  10/25/2017 1:00 PM    [place]

## 2017-10-31 ENCOUNTER — Other Ambulatory Visit: Payer: Medicare Other

## 2017-11-04 ENCOUNTER — Ambulatory Visit
Admission: RE | Admit: 2017-11-04 | Discharge: 2017-11-04 | Disposition: A | Discharge status: Home / Self Care | Payer: Medicare Other | Source: Ambulatory Visit | Attending: Family Medicine | Admitting: Family Medicine

## 2017-11-04 DIAGNOSIS — E041 Nontoxic single thyroid nodule: Secondary | ICD-10-CM

## 2017-11-15 ENCOUNTER — Other Ambulatory Visit: Payer: Medicare Other

## 2017-11-15 ENCOUNTER — Ambulatory Visit (INDEPENDENT_AMBULATORY_CARE_PROVIDER_SITE_OTHER): Payer: Medicare Other

## 2017-11-15 DIAGNOSIS — R002 Palpitations: Secondary | ICD-10-CM

## 2017-11-15 DIAGNOSIS — I10 Essential (primary) hypertension: Secondary | ICD-10-CM

## 2017-11-15 LAB — BASIC METABOLIC PANEL
BUN/Creatinine Ratio: 12 (ref 12–28)
BUN: 11 mg/dL (ref 8–27)
CO2: 26 mmol/L (ref 20–29)
Calcium: 9.5 mg/dL (ref 8.7–10.3)
Chloride: 99 mmol/L (ref 96–106)
Creatinine, Ser: 0.93 mg/dL (ref 0.57–1.00)
GFR calc Af Amer: 75 mL/min/{1.73_m2} (ref >59)
GFR calc non Af Amer: 65 mL/min/{1.73_m2} (ref >59)
Glucose: 91 mg/dL (ref 65–99)
Potassium: 4.1 mmol/L (ref 3.5–5.2)
Sodium: 138 mmol/L (ref 134–144)

## 2017-12-16 ENCOUNTER — Telehealth: Payer: Self-pay | Admitting: Nurse Practitioner

## 2017-12-16 DIAGNOSIS — R0789 Other chest pain: Secondary | ICD-10-CM

## 2017-12-16 NOTE — Telephone Encounter (Signed)
-----   Message from Thayer Headings, MD sent at 12/16/2017  4:49 PM EST ----- NSR with no significant arrhythmias

## 2017-12-16 NOTE — Telephone Encounter (Signed)
Called patient to report cardiac monitor results. She states she was not able to wear the monitor for a long time due to it keeping her awake at night. She states she had an episode with her heart yesterday that concerned her. She states she got up to walk a few feet and had chest heaviness and had to sit down and rest. She felt like her heart was beating very fast.  She states she occasionally has pain that radiates down her left arm. She asked if a carotid doppler study would be helpful and I reviewed the results of the carotid study that was done in February 2018 which would likely not explain her symptoms. She states she has a strong family history of heart disease. I advised that I will forward message to Dr. Acie Fredrickson for advice. I advised that she may need to have a stress test. I advised her to call 911 if symptoms worsen. She verbalized understanding and agreement with plan and thanked me for the call.

## 2017-12-17 NOTE — Telephone Encounter (Signed)
Has aortic calcifications Has chest pressure with exertion Lets schedule her for a stress myoview ( we can convert to a [employer] if she is not able to walk )

## 2017-12-19 NOTE — Telephone Encounter (Signed)
Left message to call back  

## 2017-12-21 NOTE — Telephone Encounter (Signed)
Spoke with patient and reviewed Dr. Elmarie Shiley advice with her. She verbalized understanding and agreement to proceed with exercise myoview and is aware someone from our office will call her to schedule. I reviewed instructions with her to hold lopressor for 24 hours prior to her test and to avoid caffeine, decaffeinated and chocolate products for 12 hours prior to test. I advised her to have only water for 2 hours prior. She verbalized understanding and agreement with plan and thanked me for the call.

## 2017-12-26 ENCOUNTER — Telehealth (HOSPITAL_COMMUNITY): Payer: Self-pay | Admitting: *Deleted

## 2017-12-26 NOTE — Telephone Encounter (Signed)
Called patient to review instructions for upcoming nuclear test, patient wanted to cancel and call back to reschedule.  Katie Potts

## 2017-12-28 ENCOUNTER — Encounter (HOSPITAL_COMMUNITY): Payer: Medicare Other

## 2018-01-02 ENCOUNTER — Telehealth (HOSPITAL_COMMUNITY): Payer: Self-pay | Admitting: *Deleted

## 2018-01-02 NOTE — Telephone Encounter (Signed)
Left message on voicemail per DPR in reference to upcoming appointment scheduled on 01/05/18 at 1000 with detailed instructions given per Myocardial Perfusion Study Information Sheet for the test. LM to arrive 15 minutes early, and that it is imperative to arrive on time for appointment to keep from having the test rescheduled. If you need to cancel or reschedule your appointment, please call the office within 24 hours of your appointment. Failure to do so may result in a cancellation of your appointment, and a $50 no show fee. Phone number given for call back for any questions.

## 2018-01-04 ENCOUNTER — Encounter (HOSPITAL_COMMUNITY): Payer: Medicare Other

## 2018-01-05 ENCOUNTER — Ambulatory Visit (HOSPITAL_COMMUNITY): Payer: Medicare Other | Attending: Cardiovascular Disease

## 2018-01-05 DIAGNOSIS — R079 Chest pain, unspecified: Secondary | ICD-10-CM | POA: Insufficient documentation

## 2018-01-05 DIAGNOSIS — R0789 Other chest pain: Secondary | ICD-10-CM

## 2018-01-05 DIAGNOSIS — R002 Palpitations: Secondary | ICD-10-CM | POA: Diagnosis not present

## 2018-01-05 DIAGNOSIS — Z8249 Family history of ischemic heart disease and other diseases of the circulatory system: Secondary | ICD-10-CM | POA: Insufficient documentation

## 2018-01-05 DIAGNOSIS — R0609 Other forms of dyspnea: Secondary | ICD-10-CM | POA: Insufficient documentation

## 2018-01-05 DIAGNOSIS — I10 Essential (primary) hypertension: Secondary | ICD-10-CM | POA: Diagnosis not present

## 2018-01-05 LAB — MYOCARDIAL PERFUSION IMAGING
Estimated workload: 5.5 METS
Exercise duration (min): 4 min
LV dias vol: 71 mL (ref 46–106)
LV sys vol: 27 mL
MPHR: 156 {beats}/min
Peak HR: 155 {beats}/min
Percent HR: 99 %
RATE: 0.22
RPE: 19
Rest HR: 68 {beats}/min
SDS: 1
SRS: 0
SSS: 1
TID: 0.9

## 2018-01-05 MED ORDER — TECHNETIUM TC 99M TETROFOSMIN IV KIT
9.8000 | PACK | Freq: Once | INTRAVENOUS | Status: AC | PRN
  Administered 2018-01-05: 9.8 via INTRAVENOUS
  Filled 2018-01-05: qty 10

## 2018-01-05 MED ORDER — TECHNETIUM TC 99M TETROFOSMIN IV KIT
32.8000 | PACK | Freq: Once | INTRAVENOUS | Status: AC | PRN
  Administered 2018-01-05: 32.8 via INTRAVENOUS
  Filled 2018-01-05: qty 33

## 2018-01-06 ENCOUNTER — Telehealth: Payer: Self-pay | Admitting: Cardiovascular Disease

## 2018-01-06 NOTE — Telephone Encounter (Signed)
Informed pt of results.  Pt verbalized understanding and was appreciative for call.

## 2018-01-06 NOTE — Telephone Encounter (Signed)
New message    Patient calling for results of stress test. Please call

## 2018-01-30 ENCOUNTER — Ambulatory Visit: Payer: Medicare Other | Admitting: Cardiovascular Disease

## 2018-03-27 ENCOUNTER — Encounter: Payer: Self-pay | Admitting: Cardiovascular Disease

## 2018-03-27 ENCOUNTER — Telehealth: Payer: Self-pay

## 2018-03-27 NOTE — Telephone Encounter (Signed)
Sent referral to scheduling attached notes to April file

## 2018-04-13 ENCOUNTER — Ambulatory Visit: Payer: Medicare Other | Admitting: Cardiovascular Disease

## 2018-08-30 ENCOUNTER — Other Ambulatory Visit: Payer: Self-pay | Admitting: Family Medicine

## 2018-08-30 DIAGNOSIS — E049 Nontoxic goiter, unspecified: Secondary | ICD-10-CM

## 2018-09-19 ENCOUNTER — Other Ambulatory Visit: Payer: Medicare Other

## 2018-10-16 ENCOUNTER — Other Ambulatory Visit: Payer: Self-pay | Admitting: Family Medicine

## 2018-10-16 DIAGNOSIS — Z1231 Encounter for screening mammogram for malignant neoplasm of breast: Secondary | ICD-10-CM

## 2018-10-25 ENCOUNTER — Ambulatory Visit
Admission: RE | Admit: 2018-10-25 | Discharge: 2018-10-25 | Disposition: A | Discharge status: Home / Self Care | Payer: Medicare Other | Source: Ambulatory Visit | Attending: Family Medicine | Admitting: Family Medicine

## 2018-10-25 DIAGNOSIS — E049 Nontoxic goiter, unspecified: Secondary | ICD-10-CM

## 2018-10-25 DIAGNOSIS — Z1231 Encounter for screening mammogram for malignant neoplasm of breast: Secondary | ICD-10-CM

## 2018-10-26 ENCOUNTER — Other Ambulatory Visit: Payer: Medicare Other

## 2019-03-22 ENCOUNTER — Emergency Department (HOSPITAL_COMMUNITY): Payer: Medicare Other

## 2019-03-22 ENCOUNTER — Encounter (HOSPITAL_COMMUNITY): Payer: Self-pay

## 2019-03-22 ENCOUNTER — Emergency Department (HOSPITAL_COMMUNITY)
Admission: EM | Admit: 2019-03-22 | Discharge: 2019-03-22 | Disposition: A | Discharge status: Home / Self Care | Payer: Medicare Other | Attending: Emergency Medicine | Admitting: Emergency Medicine

## 2019-03-22 ENCOUNTER — Other Ambulatory Visit: Payer: Self-pay

## 2019-03-22 DIAGNOSIS — G43109 Migraine with aura, not intractable, without status migrainosus: Secondary | ICD-10-CM | POA: Diagnosis not present

## 2019-03-22 DIAGNOSIS — J45909 Unspecified asthma, uncomplicated: Secondary | ICD-10-CM | POA: Insufficient documentation

## 2019-03-22 DIAGNOSIS — I10 Essential (primary) hypertension: Secondary | ICD-10-CM | POA: Insufficient documentation

## 2019-03-22 DIAGNOSIS — Z8673 Personal history of transient ischemic attack (TIA), and cerebral infarction without residual deficits: Secondary | ICD-10-CM | POA: Insufficient documentation

## 2019-03-22 DIAGNOSIS — Z79899 Other long term (current) drug therapy: Secondary | ICD-10-CM | POA: Diagnosis not present

## 2019-03-22 DIAGNOSIS — R4781 Slurred speech: Secondary | ICD-10-CM | POA: Diagnosis present

## 2019-03-22 LAB — DIFFERENTIAL
Abs Immature Granulocytes: 0.01 10*3/uL (ref 0.00–0.07)
Basophils Absolute: 0.1 10*3/uL (ref 0.0–0.1)
Basophils Relative: 2 %
Eosinophils Absolute: 0.1 10*3/uL (ref 0.0–0.5)
Eosinophils Relative: 3 %
Immature Granulocytes: 0 %
Lymphocytes Relative: 28 %
Lymphs Abs: 1.3 10*3/uL (ref 0.7–4.0)
Monocytes Absolute: 0.4 10*3/uL (ref 0.1–1.0)
Monocytes Relative: 8 %
Neutro Abs: 2.8 10*3/uL (ref 1.7–7.7)
Neutrophils Relative %: 59 %

## 2019-03-22 LAB — CBG MONITORING, ED: Glucose-Capillary: 90 mg/dL (ref 70–99)

## 2019-03-22 LAB — COMPREHENSIVE METABOLIC PANEL
ALT: 21 U/L (ref 0–44)
AST: 21 U/L (ref 15–41)
Albumin: 4.1 g/dL (ref 3.5–5.0)
Alkaline Phosphatase: 131 U/L — ABNORMAL HIGH (ref 38–126)
Anion gap: 10 (ref 5–15)
BUN: 11 mg/dL (ref 8–23)
CO2: 25 mmol/L (ref 22–32)
Calcium: 9.7 mg/dL (ref 8.9–10.3)
Chloride: 102 mmol/L (ref 98–111)
Creatinine, Ser: 0.85 mg/dL (ref 0.44–1.00)
GFR calc Af Amer: >60 mL/min (ref >60)
GFR calc non Af Amer: >60 mL/min (ref >60)
Glucose, Bld: 107 mg/dL — ABNORMAL HIGH (ref 70–99)
Potassium: 3.8 mmol/L (ref 3.5–5.1)
Sodium: 137 mmol/L (ref 135–145)
Total Bilirubin: 0.9 mg/dL (ref 0.3–1.2)
Total Protein: 7.5 g/dL (ref 6.5–8.1)

## 2019-03-22 LAB — PROTIME-INR
INR: 1.1 (ref 0.8–1.2)
Prothrombin Time: 14.3 seconds (ref 11.4–15.2)

## 2019-03-22 LAB — TROPONIN I: Troponin I: <0.03 ng/mL (ref <0.03)

## 2019-03-22 LAB — CBC
HCT: 46.2 % — ABNORMAL HIGH (ref 36.0–46.0)
Hemoglobin: 15.5 g/dL — ABNORMAL HIGH (ref 12.0–15.0)
MCH: 29.5 pg (ref 26.0–34.0)
MCHC: 33.5 g/dL (ref 30.0–36.0)
MCV: 87.8 fL (ref 80.0–100.0)
Platelets: 267 10*3/uL (ref 150–400)
RBC: 5.26 MIL/uL — ABNORMAL HIGH (ref 3.87–5.11)
RDW: 13.2 % (ref 11.5–15.5)
WBC: 4.6 10*3/uL (ref 4.0–10.5)
nRBC: 0 % (ref 0.0–0.2)

## 2019-03-22 LAB — I-STAT CREATININE, ED: Creatinine, Ser: 0.8 mg/dL (ref 0.44–1.00)

## 2019-03-22 LAB — APTT: aPTT: 30 seconds (ref 24–36)

## 2019-03-22 MED ORDER — ACETAMINOPHEN 325 MG PO TABS: 650.0000 mg | ORAL_TABLET | Freq: Once | ORAL | Status: DC

## 2019-03-22 MED ORDER — DIPHENHYDRAMINE HCL 50 MG/ML IJ SOLN
25.0000 mg | Freq: Once | INTRAMUSCULAR | Status: AC
  Administered 2019-03-22: 25 mg via INTRAVENOUS
  Filled 2019-03-22: qty 1

## 2019-03-22 MED ORDER — METOCLOPRAMIDE HCL 5 MG/ML IJ SOLN
10.0000 mg | Freq: Once | INTRAMUSCULAR | Status: AC
  Administered 2019-03-22: 10 mg via INTRAVENOUS
  Filled 2019-03-22: qty 2

## 2019-03-22 MED ORDER — SODIUM CHLORIDE 0.9% FLUSH
3.0000 mL | Freq: Once | INTRAVENOUS | Status: AC
  Administered 2019-03-22: 3 mL via INTRAVENOUS

## 2019-03-22 MED ORDER — METOCLOPRAMIDE HCL 10 MG PO TABS: 10.0000 mg | ORAL_TABLET | Freq: Four times a day (QID) | ORAL | 0 refills | Status: DC | PRN

## 2019-03-22 NOTE — ED Notes (Signed)
Katie Potts (friend)  [phone]  Call with updates or discharge.

## 2019-03-22 NOTE — ED Provider Notes (Signed)
[facility] EMERGENCY DEPARTMENT Provider Note   CSN: [id number] Arrival date & time: 03/22/19  1650    History   Chief Complaint Chief Complaint  Patient presents with   Stroke Symptoms    HPI Katie Potts is a 68 y.o. female history of hyperlipidemia, depression here presenting with slurred speech, palpitations.  Patient states that she has been having intermittent palpitations and hypertension at home.  She states that occasionally she has some trouble speaking and some subjective slurred speech as well.  Patient has some associated headaches as well. She denies any recent travel with shortness of breath or fevers.  Patient states that she had previous stroke. Patient is not on blood thinners.      The history is provided by the patient.    Past Medical History:  Diagnosis Date   Asthma    Depression    MAJOR DEPRESSION, DR. KAUR;PSYCHIATRIST   Hyperlipidemia     Patient Active Problem List   Diagnosis Date Noted   Palpitations 07/08/2014   Hyperlipemia 07/08/2014    Past Surgical History:  Procedure Laterality Date   BREAST BIOPSY     BREAST EXCISIONAL BIOPSY     BREAST SURGERY     REDUCTION MAMMAPLASTY Bilateral    30 years ago     OB History   No obstetric history on file.      Home Medications    Prior to Admission medications   Medication Sig Start Date End Date Taking? Authorizing Provider  albuterol (PROVENTIL HFA;VENTOLIN HFA) 108 (90 BASE) MCG/ACT inhaler Inhale 2 puffs into the lungs every 6 (six) hours as needed for wheezing or shortness of breath.    [provider]  clonazePAM (KLONOPIN) 1 MG tablet Take 4 mg by mouth at bedtime.     [provider]  DULoxetine (CYMBALTA) 60 MG capsule Take 60 mg daily by mouth. 09/05/17   [provider]  losartan (COZAAR) 50 MG tablet Take 1 tablet (50 mg total) daily by mouth. 10/25/17 03/22/19  Nahser, Wonda Cheng, MD  metoprolol tartrate (LOPRESSOR) 25 MG  tablet Take 1 tablet (25 mg total) 2 (two) times daily by mouth. 10/25/17 03/22/19  Nahser, Wonda Cheng, MD  pravastatin (PRAVACHOL) 40 MG tablet Take 40 mg once a week by mouth. 08/19/17   [provider]  zolpidem (AMBIEN) 5 MG tablet Take 10 mg by mouth at bedtime as needed for sleep.     [provider]    Family History Family History  Problem Relation Age of Onset   Pancreatic cancer Mother    Cancer Mother    Hyperlipidemia Mother    Hypertension Mother    Varicose Veins Mother    Hypertension Sister    Hyperlipidemia Sister    Heart attack Brother    Mental illness Sister    Hyperlipidemia Sister    Hypertension Sister    Breast cancer Sister        unsure of age   Hypertension Brother    Cancer Father    Deep vein thrombosis Father    Diabetes Father    Heart disease Father    Hyperlipidemia Father    Hypertension Father    Peripheral vascular disease Father        amputation   AAA (abdominal aortic aneurysm) Father    Colon cancer Paternal Uncle    Breast cancer Cousin        unsure of age    Social History Social History  Tobacco Use   Smoking status: Never Smoker   Smokeless tobacco: Never Used  Substance Use Topics   Alcohol use: No    Alcohol/week: 0.0 standard drinks   Drug use: No     Allergies   Seroquel [quetiapine fumarate]   Review of Systems Review of Systems  Neurological: Positive for speech difficulty.  All other systems reviewed and are negative.    Physical Exam Updated Vital Signs BP 135/80    Pulse 78    Temp 97.8 F (36.6 C) (Oral)    Resp 18    SpO2 97%   Physical Exam Vitals signs and nursing note reviewed.  Constitutional:      Comments: Uncomfortable   HENT:     Head: Normocephalic.     Nose: Nose normal.     Mouth/Throat:     Mouth: Mucous membranes are moist.  Eyes:     Extraocular Movements: Extraocular movements intact.     Pupils: Pupils are equal, round, and  reactive to light.  Neck:     Musculoskeletal: Normal range of motion.  Cardiovascular:     Rate and Rhythm: Normal rate and regular rhythm.     Pulses: Normal pulses.  Pulmonary:     Effort: Pulmonary effort is normal.  Abdominal:     General: Abdomen is flat.  Musculoskeletal: Normal range of motion.  Skin:    General: Skin is warm.     Capillary Refill: Capillary refill takes less than 2 seconds.  Neurological:     General: No focal deficit present.     Mental Status: She is alert and oriented to person, place, and time.     Comments: CN 2- 12 intact, nl strength and sensation throughout   Psychiatric:        Mood and Affect: Mood normal.      ED Treatments / Results  Labs (all labs ordered are listed, but only abnormal results are displayed) Labs Reviewed  CBC - Abnormal; Notable for the following components:      Result Value   RBC 5.26 (*)    Hemoglobin 15.5 (*)    HCT 46.2 (*)    All other components within normal limits  COMPREHENSIVE METABOLIC PANEL - Abnormal; Notable for the following components:   Glucose, Bld 107 (*)    Alkaline Phosphatase 131 (*)    All other components within normal limits  PROTIME-INR  APTT  DIFFERENTIAL  TROPONIN I  I-STAT CREATININE, ED  CBG MONITORING, ED    EKG EKG Interpretation  Date/Time:  Thursday March 22 2019 16:58:15 EDT Ventricular Rate:  89 PR Interval:  150 QRS Duration: 74 QT Interval:  368 QTC Calculation: 447 R Axis:   47 Text Interpretation:  Normal sinus rhythm Possible Left atrial enlargement Borderline ECG No significant change since last tracing Confirmed by Wandra Arthurs [phone]) on 03/22/2019 6:34:15 PM   Radiology Dg Chest 2 View  Result Date: 03/22/2019 CLINICAL DATA:  Palpitations, chest pain. EXAM: CHEST - 2 VIEW COMPARISON:  Radiographs of November 05, 2011. FINDINGS: The heart size and mediastinal contours are within normal limits. Both lungs are clear. No pneumothorax or pleural effusion is  noted. The visualized skeletal structures are unremarkable. IMPRESSION: No active cardiopulmonary disease. Electronically Signed   By: Marijo Conception, M.D.   On: 03/22/2019 20:03   Ct Head Wo Contrast  Result Date: 03/22/2019 CLINICAL DATA:  Slurred speech. EXAM: CT HEAD WITHOUT CONTRAST TECHNIQUE: Contiguous axial images were obtained from the  base of the skull through the vertex without intravenous contrast. COMPARISON:  None. FINDINGS: Brain: No evidence of acute infarction, hemorrhage, hydrocephalus, extra-axial collection or mass lesion/mass effect. Vascular: No hyperdense vessel or unexpected calcification. Skull: Normal. Negative for fracture or focal lesion. Sinuses/Orbits: No acute finding. Other: None. IMPRESSION: Normal head CT. Electronically Signed   By: Marijo Conception, M.D.   On: 03/22/2019 17:26   Mr Brain Wo Contrast  Result Date: 03/22/2019 CLINICAL DATA:  Slurred speech for several days. Blood pressure issues. EXAM: MRI HEAD WITHOUT CONTRAST TECHNIQUE: Multiplanar, multiecho pulse sequences of the brain and surrounding structures were obtained without intravenous contrast. COMPARISON:  CT head earlier today. FINDINGS: Brain: No evidence for acute infarction, hemorrhage, mass lesion, hydrocephalus, or extra-axial fluid. Mild cerebral and cerebellar. Moderately advanced T2 and FLAIR hyperintensities affect the subcortical and periventricular white matter, with significant involvement of the mid pons consistent with chronic microvascular ischemic change. Prominent perivascular spaces. Vascular: Flow voids are maintained throughout the carotid, basilar, and vertebral arteries. There are no areas of chronic hemorrhage. RIGHT vertebral dominant. Skull and upper cervical spine: Normal marrow signal. Sinuses/Orbits: Negative. Other: None. IMPRESSION: Mild atrophy. Moderately advanced small vessel disease. No acute intracranial findings. Electronically Signed   By: Staci Righter M.D.   On:  03/22/2019 20:21    Procedures Procedures (including critical care time)  Medications Ordered in ED Medications  sodium chloride flush (NS) 0.9 % injection 3 mL (3 mLs Intravenous Given 03/22/19 2052)  metoCLOPramide (REGLAN) injection 10 mg (10 mg Intravenous Given 03/22/19 2047)  diphenhydrAMINE (BENADRYL) injection 25 mg (25 mg Intravenous Given 03/22/19 2047)     Initial Impression / Assessment and Plan / ED Course  I have reviewed the triage vital signs and the nursing notes.  Pertinent labs & imaging results that were available during my care of the patient were reviewed by me and considered in my medical decision making (see chart for details).        Eddie Koc is a 68 y.o. female here with palpitations, headaches, slurred speech. No slurred speech currently, nonfocal neuro exam. Consider complex migraine vs small stroke. Will give migraine cocktail, MRI brain, labs.   9:30 PM Labs unremarkable. MRI brain unremarkable. BP down to 135/80 with migraine cocktail. Headache improved. Likely complex migraine, will dc home with neurology follow up.    Final Clinical Impressions(s) / ED Diagnoses   Final diagnoses:  None    ED Discharge Orders    None       Drenda Freeze, MD 03/22/19 2131

## 2019-03-22 NOTE — ED Triage Notes (Signed)
Pt brought in by friend with complaint of slurred speech for several days. She also reports she has been having BP issues.

## 2019-03-22 NOTE — Discharge Instructions (Signed)
Take tylenol, motrin for headaches   Take reglan for severe headaches.   See neurologist for follow up   Return to ER if you have worse headaches, trouble speaking.

## 2019-06-19 ENCOUNTER — Ambulatory Visit: Payer: Medicare Other | Admitting: Neurology

## 2019-06-21 ENCOUNTER — Ambulatory Visit: Payer: Medicare Other | Admitting: Neurology

## 2019-07-04 ENCOUNTER — Telehealth: Payer: Self-pay | Admitting: Cardiovascular Disease

## 2019-07-04 NOTE — Telephone Encounter (Signed)

## 2019-07-06 ENCOUNTER — Ambulatory Visit (INDEPENDENT_AMBULATORY_CARE_PROVIDER_SITE_OTHER): Payer: Medicare Other | Admitting: Cardiovascular Disease

## 2019-07-06 ENCOUNTER — Encounter: Payer: Self-pay | Admitting: Cardiovascular Disease

## 2019-07-06 ENCOUNTER — Other Ambulatory Visit: Payer: Self-pay

## 2019-07-06 VITALS — BP 116/80 | HR 69 | Ht 60.0 in | Wt 162.0 lb

## 2019-07-06 DIAGNOSIS — I110 Hypertensive heart disease with heart failure: Secondary | ICD-10-CM | POA: Insufficient documentation

## 2019-07-06 DIAGNOSIS — I10 Essential (primary) hypertension: Secondary | ICD-10-CM | POA: Diagnosis not present

## 2019-07-06 DIAGNOSIS — R002 Palpitations: Secondary | ICD-10-CM

## 2019-07-06 HISTORY — DX: Essential (primary) hypertension: I10

## 2019-07-06 HISTORY — DX: Hypertensive heart disease with heart failure: I11.0

## 2019-07-06 NOTE — Patient Instructions (Signed)
Medication Instructions:  Your physician recommends that you continue on your current medications as directed. Please refer to the Current Medication list given to you today.  Labwork: None ordered.  Testing/Procedures: None ordered.  Follow-Up: Your physician recommends that you schedule a follow-up appointment in:   6 months with Dr. Acie Fredrickson  Any Other Special Instructions Will Be Listed Below (If Applicable).     If you need a refill on your cardiac medications before your next appointment, please call your pharmacy.

## 2019-07-06 NOTE — Progress Notes (Signed)
Cardiology Office Note:    Date:  07/06/2019   ID:  Katie Potts, DOB 1951/11/12, MRN [id number]  PCP:  Maurice Small, MD  Cardiologist:  Mertie Moores, MD    Referring MD: Maurice Small, MD   Problem List 1. Essential HTN 2. Hyperlipidemia  3.  Chronic diastolic CHF ( grade 1 Diastolic dysfunction )  4.  Hyperlipidemia -   5. Family hx of CAD   No chief complaint on file.   History of Present Illness:    Katie Potts is a 68 y.o. female with a hx of HTN,  Who is referred by Dr. Justin Mend for further eval of palpitations  She has had these palpitations for years.. These of course occasionally.  She might have 2 a day..  These typically last 3-4 minutes.  These are associated with the with some shortness of breath and difficulty to catch her breath.  Also associated with some dizziness and chest discomfort.  These cause her to stop whatever she is doing.  They seem to be more common at night.  She is noted that if she is walking at the time that these occur, easiness will force her to stop walking and make her sit on the ground.    Her blood pressure has been high for frequently.  She has associated headaches. She avoids eating excessive salt.but does admit to eating some fast foods and fried foods on occasion.  She has a history of hyperlipidemia.  She is currently on Pravachol but this causes her to have a rash.  She is been breaking the pill in half.  Her lipid levels are still high.  On disability -previously worked at [employer]  July 06, 2019 : She continues to have palpitations.  She does not  notice any improvement after starting the metoprolol.    They occurred at no specific time.  She exercises on rare occasion.  These typically do not occur when she is exercising.  They tend to occur more at night.  Last for a second or so .  Clinically the sound like premature ventricular contractions.  I advised her to drink more V8 juice and increase her intake of foods that are  high in potassium. Had a neuro event in March , 2020.   She went to the ER.  She was told this was a form of migraine headache.   Past Medical History:  Diagnosis Date  . Asthma   . Depression    MAJOR DEPRESSION, DR. KAUR;PSYCHIATRIST  . Hyperlipidemia     Past Surgical History:  Procedure Laterality Date  . BREAST BIOPSY    . BREAST EXCISIONAL BIOPSY    . BREAST SURGERY    . REDUCTION MAMMAPLASTY Bilateral    30 years ago    Current Medications: Current Meds  Medication Sig  . amLODipine (NORVASC) 5 MG tablet Take 1 tablet by mouth daily.  . clonazePAM (KLONOPIN) 1 MG tablet Take 4 mg by mouth at bedtime.   . DULoxetine (CYMBALTA) 30 MG capsule Take 30 mg by mouth daily.  Marland Kitchen FLUoxetine (PROZAC) 10 MG capsule Take 1 capsule by mouth daily.  . hydrochlorothiazide (HYDRODIURIL) 25 MG tablet Take 1 tablet by mouth daily.  . pravastatin (PRAVACHOL) 10 MG tablet Take 1 tablet by mouth daily.  Marland Kitchen triamcinolone cream (KENALOG) 0.1 % Apply 1 application topically as needed.  . zolpidem (AMBIEN) 10 MG tablet Take 1 tablet by mouth as needed.  . [DISCONTINUED] DULoxetine (CYMBALTA) 60 MG capsule Take 60  mg daily by mouth.  . [DISCONTINUED] pravastatin (PRAVACHOL) 40 MG tablet Take 40 mg once a week by mouth.  . [DISCONTINUED] zolpidem (AMBIEN) 5 MG tablet Take 10 mg by mouth at bedtime as needed for sleep.      Allergies:   Seroquel [quetiapine fumarate]   Social History   Socioeconomic History  . Marital status: Single    Spouse name: Not on file  . Number of children: Not on file  . Years of education: Not on file  . Highest education level: Not on file  Occupational History  . Not on file  Social Needs  . Financial resource strain: Not on file  . Food insecurity    Worry: Not on file    Inability: Not on file  . Transportation needs    Medical: Not on file    Non-medical: Not on file  Tobacco Use  . Smoking status: Never Smoker  . Smokeless tobacco: Never Used   Substance and Sexual Activity  . Alcohol use: No    Alcohol/week: 0.0 standard drinks  . Drug use: No  . Sexual activity: Not Currently  Lifestyle  . Physical activity    Days per week: Not on file    Minutes per session: Not on file  . Stress: Not on file  Relationships  . Social Herbalist on phone: Not on file    Gets together: Not on file    Attends religious service: Not on file    Active member of club or organization: Not on file    Attends meetings of clubs or organizations: Not on file    Relationship status: Not on file  Other Topics Concern  . Not on file  Social History Narrative  . Not on file     Family History: The patient's family history includes AAA (abdominal aortic aneurysm) in her father; Breast cancer in her cousin and sister; Cancer in her father and mother; Colon cancer in her paternal uncle; Deep vein thrombosis in her father; Diabetes in her father; Heart attack in her brother; Heart disease in her father; Hyperlipidemia in her father, mother, sister, and sister; Hypertension in her brother, father, mother, sister, and sister; Mental illness in her sister; Pancreatic cancer in her mother; Peripheral vascular disease in her father; Varicose Veins in her mother. ROS:   Please see the history of present illness.     All other systems reviewed and are negative.  EKGs/Labs/Other Studies Reviewed:    The following studies were reviewed today:   EKG:  EKG is  ordered today.  The ekg ordered today demonstrates  NSR  At 76.  No St or T wave changes   Recent Labs: 03/22/2019: ALT 21; BUN 11; Creatinine, Ser 0.80; Hemoglobin 15.5; Platelets 267; Potassium 3.8; Sodium 137  Recent Lipid Panel No results found for: CHOL, TRIG, HDL, CHOLHDL, VLDL, LDLCALC, LDLDIRECT  Physical Exam:    Physical Exam: Blood pressure 116/80, pulse 69, height 5' (1.524 m), weight 162 lb (73.5 kg), SpO2 95 %.  GEN:  Well nourished, well developed in no acute distress  HEENT: Normal NECK: No JVD; No carotid bruits LYMPHATICS: No lymphadenopathy CARDIAC: RRR , no murmurs, rubs, gallops RESPIRATORY:  Clear to auscultation without rales, wheezing or rhonchi  ABDOMEN: Soft, non-tender, non-distended MUSCULOSKELETAL:  No edema; No deformity  SKIN: Warm and dry NEUROLOGIC:  Alert and oriented x 3   ASSESSMENT:    1. Essential hypertension    PLAN:  In order of problems listed above:  Palpitations:    Minna Merritts is having palpitations .  Still having these episodes.  Last only a second. Likely PVCs.  Encouraged her to drink V8 juice every day to see if this helps.  If it does not then he should place an event monitor on her.  I will see her again in 6 months for follow-up visit.  2.  Essential HTN:    Blood pressure is well controlled.  Medication Adjustments/Labs and Tests Ordered: Current medicines are reviewed at length with the patient today.  Concerns regarding medicines are outlined above.  No orders of the defined types were placed in this encounter.  No orders of the defined types were placed in this encounter.   Signed, Mertie Moores, MD  07/06/2019 12:47 PM    [place]

## 2019-07-20 ENCOUNTER — Ambulatory Visit: Payer: Medicare Other | Admitting: Neurology

## 2019-11-04 NOTE — Progress Notes (Signed)
NEUROLOGY CONSULTATION NOTE  Katie Potts MRN: [id number] DOB: 15-Aug-1951  Referring provider: Maurice Small, MD Primary care provider: Maurice Small, MD  Reason for consult:  TIA vs migraine  HISTORY OF PRESENT ILLNESS: Katie Potts is a 68 year old right-handed black female with depression, hypertension, hyperlipidemia and asthma who presents for TIA vs migraine.  History supplemented by ED and referring provider notes.  On 03/22/2019, she was on the phone and she was confused.  She didn't make sense when she spoke.  Her friend came to the house and noted that she had a left facial droop.  She had trouble reading.  If she would read, she would stumble over the words.  She also had left arm and leg weakness.  She had a mild throbbing left occipital pounding headache.  She presented to the [facility] ED for further evaluation.  Blood pressure was 180/85, which was treated.  CT and MRI of brain without contrast were personally reviewed and showed mild atrophy and moderate chronic small vessel ischemic changes but no acute intracranial abnormality.  EKG showed normal sinus rhythm.  Complicated migraine was suspected and she was treated with migraine cocktail of Benadryl and Reglan.  Symptoms lasted about 3 hours.  Since then, she reports mild residual weakness on the left side of her face, arm and leg.  She still reports some difficulty with talking, including slurred speech.  If she needs to text somebody on the phone, she needs time to write it out first and then she copies it into a text.  Once in a while, she has a headache which she attributes to high blood pressure.  Since the event, she reports generalized fatigue.  She reports palpitations.  She has upcoming appointment with cardiologist, Dr. Daneen Schick.  She reports no significant history of headaches.  PAST MEDICAL HISTORY: Past Medical History:  Diagnosis Date  . Asthma   . Depression    MAJOR DEPRESSION, DR. KAUR;PSYCHIATRIST  . HTN  (hypertension) 07/06/2019  . Hyperlipidemia     PAST SURGICAL HISTORY: Past Surgical History:  Procedure Laterality Date  . BREAST BIOPSY    . BREAST EXCISIONAL BIOPSY    . BREAST SURGERY    . REDUCTION MAMMAPLASTY Bilateral    30 years ago    MEDICATIONS: Current Outpatient Medications on File Prior to Visit  Medication Sig Dispense Refill  . amLODipine (NORVASC) 5 MG tablet Take 1 tablet by mouth daily.    . clonazePAM (KLONOPIN) 1 MG tablet Take 4 mg by mouth at bedtime.     . DULoxetine (CYMBALTA) 30 MG capsule Take 30 mg by mouth daily.    Marland Kitchen FLUoxetine (PROZAC) 10 MG capsule Take 1 capsule by mouth daily.    . hydrochlorothiazide (HYDRODIURIL) 25 MG tablet Take 1 tablet by mouth daily.    . pravastatin (PRAVACHOL) 10 MG tablet Take 1 tablet by mouth daily.    Marland Kitchen triamcinolone cream (KENALOG) 0.1 % Apply 1 application topically as needed.    . zolpidem (AMBIEN) 10 MG tablet Take 1 tablet by mouth as needed.     No current facility-administered medications on file prior to visit.     ALLERGIES: Allergies  Allergen Reactions  . Seroquel [Quetiapine Fumarate] Swelling    FAMILY HISTORY: Family History  Problem Relation Age of Onset  . Pancreatic cancer Mother   . Cancer Mother   . Hyperlipidemia Mother   . Hypertension Mother   . Varicose Veins Mother   . Hypertension Sister   .  Hyperlipidemia Sister   . Heart attack Brother   . Mental illness Sister   . Hyperlipidemia Sister   . Hypertension Sister   . Breast cancer Sister        unsure of age  . Hypertension Brother   . Cancer Father   . Deep vein thrombosis Father   . Diabetes Father   . Heart disease Father   . Hyperlipidemia Father   . Hypertension Father   . Peripheral vascular disease Father        amputation  . AAA (abdominal aortic aneurysm) Father   . Colon cancer Paternal Uncle   . Breast cancer Cousin        unsure of age   SOCIAL HISTORY: Social History   Socioeconomic History  .  Marital status: Single    Spouse name: Not on file  . Number of children: Not on file  . Years of education: Not on file  . Highest education level: Not on file  Occupational History  . Not on file  Social Needs  . Financial resource strain: Not on file  . Food insecurity    Worry: Not on file    Inability: Not on file  . Transportation needs    Medical: Not on file    Non-medical: Not on file  Tobacco Use  . Smoking status: Never Smoker  . Smokeless tobacco: Never Used  Substance and Sexual Activity  . Alcohol use: No    Alcohol/week: 0.0 standard drinks  . Drug use: No  . Sexual activity: Not Currently  Lifestyle  . Physical activity    Days per week: Not on file    Minutes per session: Not on file  . Stress: Not on file  Relationships  . Social Herbalist on phone: Not on file    Gets together: Not on file    Attends religious service: Not on file    Active member of club or organization: Not on file    Attends meetings of clubs or organizations: Not on file    Relationship status: Not on file  . Intimate partner violence    Fear of current or ex partner: Not on file    Emotionally abused: Not on file    Physically abused: Not on file    Forced sexual activity: Not on file  Other Topics Concern  . Not on file  Social History Narrative  . Not on file    REVIEW OF SYSTEMS: Constitutional: No fevers, chills, or sweats, no generalized fatigue, change in appetite Eyes: No visual changes, double vision, eye pain Ear, nose and throat: No hearing loss, ear pain, nasal congestion, sore throat Cardiovascular: No chest pain, palpitations Respiratory:  No shortness of breath at rest or with exertion, wheezes GastrointestinaI: No nausea, vomiting, diarrhea, abdominal pain, fecal incontinence Genitourinary:  No dysuria, urinary retention or frequency Musculoskeletal:  No neck pain, back pain Integumentary: No rash, pruritus, skin lesions Neurological: as above  Psychiatric: No depression, insomnia, anxiety Endocrine: No palpitations, fatigue, diaphoresis, mood swings, change in appetite, change in weight, increased thirst Hematologic/Lymphatic:  No purpura, petechiae. Allergic/Immunologic: no itchy/runny eyes, nasal congestion, recent allergic reactions, rashes  PHYSICAL EXAM: Blood pressure (!) 164/78, pulse 63, height 5\' 1"  (1.549 m), weight 163 lb 9.6 oz (74.2 kg), SpO2 98 %. General: No acute distress.  Patient appears well-groomed.   Head:  Normocephalic/atraumatic Eyes:  fundi examined but not visualized Neck: supple, no paraspinal tenderness, full range of motion  Back: No paraspinal tenderness Heart: regular rate and rhythm Lungs: Clear to auscultation bilaterally. Vascular: No carotid bruits. Neurological Exam: Mental status: alert and oriented to person, place, and time, recent and remote memory intact, fund of knowledge intact, attention and concentration intact, speech fluent and not dysarthric, language intact. Cranial nerves: CN I: not tested CN II: pupils equal, round and reactive to light, visual fields intact CN III, IV, VI:  full range of motion, no nystagmus, no ptosis CN V: facial sensation intact CN VII: Mild left lower facial weakness CN VIII: hearing intact CN IX, X: gag intact, uvula midline CN XI: sternocleidomastoid and trapezius muscles intact CN XII: tongue midline Bulk & Tone: normal, no fasciculations. Motor:  Some decreased effort but overall 5/5 throughout.  Sensation:  temperature and vibration sensation intact.   Deep Tendon Reflexes:  2+ throughout, toes downgoing.   Finger to nose testing:  Mildly tremulous.  Without dysmetria.   Heel to shin:  Without dysmetria.   Gait:  Slow and cautious.  Romberg with sway.  IMPRESSION: 1.  Episode of speech disturbance and left sided weakness.  Associated with headache.  Diagnosed with migraine in ED.  I would err on side of caution and treat for transient ischemic  attack.  She has no history of migraines.  She does appear to have mild left lower facial weakness.  She appears somewhat weak in all extremities, as well as tremulousness, which would not be residual from this event.  Unclear if this is related to anxiety.    PLAN: 1.  ASA 81mg  daily  2.  Continue pravastatin (LDL goal should be less than 70) 3.  Follow up with PCP regarding blood pressure 4.  Check carotid doppler 5.  Follow up with cardiology, Dr. Tamala Julian, as scheduled. 6.  Follow up with me in 6 months.  Thank you for allowing me to take part in the care of this patient.  Metta Clines, DO  CC: Maurice Small, MD

## 2019-11-06 ENCOUNTER — Encounter: Payer: Self-pay | Admitting: Neurology

## 2019-11-06 ENCOUNTER — Ambulatory Visit: Payer: Medicare Other | Admitting: Neurology

## 2019-11-06 ENCOUNTER — Other Ambulatory Visit: Payer: Self-pay

## 2019-11-06 VITALS — BP 164/78 | HR 63 | Ht 61.0 in | Wt 163.6 lb

## 2019-11-06 DIAGNOSIS — G459 Transient cerebral ischemic attack, unspecified: Secondary | ICD-10-CM

## 2019-11-06 DIAGNOSIS — R002 Palpitations: Secondary | ICD-10-CM

## 2019-11-06 DIAGNOSIS — I10 Essential (primary) hypertension: Secondary | ICD-10-CM

## 2019-11-06 DIAGNOSIS — E782 Mixed hyperlipidemia: Secondary | ICD-10-CM | POA: Diagnosis not present

## 2019-11-06 NOTE — Patient Instructions (Addendum)
I would treat as if you had a transient ischemic attack or mini stroke 1.  Take aspirin 81mg  daily 2.  Take your cholesterol and blood pressure medication 3.  We will check carotid ultrasound 4.  Follow up with Dr. Tamala Julian 5.  Follow up in 6 months.  You have been scheduled for a US Carotid at [facility] if you have not heard from them please call them to schedule at [phone]

## 2019-11-18 NOTE — Progress Notes (Deleted)
Cardiology Office Note:    Date:  11/18/2019   ID:  Katie Potts, DOB [birth year], MRN [id number]  PCP:  Maurice Small, MD  Cardiologist:  No primary care provider on file.   Referring MD: Maurice Small, MD   No chief complaint on file.   History of Present Illness:    Katie Potts is a 68 y.o. female with a hx of essential hypertension, palpitations, and atypical chest pain. Also with h/o depression and DM II.  ***  Past Medical History:  Diagnosis Date  . Asthma   . Depression    MAJOR DEPRESSION, DR. KAUR;PSYCHIATRIST  . HTN (hypertension) 07/06/2019  . Hyperlipidemia     Past Surgical History:  Procedure Laterality Date  . BREAST BIOPSY    . BREAST EXCISIONAL BIOPSY    . BREAST SURGERY    . REDUCTION MAMMAPLASTY Bilateral    30 years ago    Current Medications: No outpatient medications have been marked as taking for the 11/21/19 encounter (Appointment) with Belva Crome, MD.     Allergies:   Seroquel [quetiapine fumarate]   Social History   Socioeconomic History  . Marital status: Single    Spouse name: Not on file  . Number of children: 1  . Years of education: Not on file  . Highest education level: Some college, no degree  Occupational History  . Not on file  Social Needs  . Financial resource strain: Not on file  . Food insecurity    Worry: Not on file    Inability: Not on file  . Transportation needs    Medical: Not on file    Non-medical: Not on file  Tobacco Use  . Smoking status: Never Smoker  . Smokeless tobacco: Never Used  Substance and Sexual Activity  . Alcohol use: No    Alcohol/week: 0.0 standard drinks  . Drug use: No  . Sexual activity: Not Currently  Lifestyle  . Physical activity    Days per week: Not on file    Minutes per session: Not on file  . Stress: Not on file  Relationships  . Social Herbalist on phone: Not on file    Gets together: Not on file    Attends religious service: Not on file    Active  member of club or organization: Not on file    Attends meetings of clubs or organizations: Not on file    Relationship status: Not on file  Other Topics Concern  . Not on file  Social History Narrative  . Not on file     Family History: The patient's family history includes AAA (abdominal aortic aneurysm) in her father; Breast cancer in her cousin and sister; Cancer in her father and mother; Colon cancer in her paternal uncle; Deep vein thrombosis in her father; Diabetes in her father; Heart attack in her brother; Heart disease in her father; Hyperlipidemia in her father, mother, sister, and sister; Hypertension in her brother, father, mother, sister, and sister; Mental illness in her sister; Pancreatic cancer in her mother; Peripheral vascular disease in her father; Varicose Veins in her mother.  ROS:   Please see the history of present illness.    *** All other systems reviewed and are negative.  EKGs/Labs/Other Studies Reviewed:    The following studies were reviewed today:  Event Monitor 10/2017: Study Highlights   NSR with no significant arrhythmias  Artifact present   NUCLEAR STRESS TEST 12/2017 2019: Study Highlights  Nuclear stress EF: 62%.  Blood pressure demonstrated a hypertensive response to exercise.  No T wave inversion was noted during stress.  There was no ST segment deviation noted during stress.  The study is normal.  This is a low risk study.   Normal perfusion. LVEF 62% with normal wall motion. This is a low risk study.   ECHOCARDIOGRAM 01/2017: Study Conclusions  - Left ventricle: The cavity size was normal. Wall thickness was   increased in a pattern of moderate LVH. Systolic function was   normal. The estimated ejection fraction was in the range of 60%   to 65%. Wall motion was normal; there were no regional wall   motion abnormalities. Doppler parameters are consistent with   abnormal left ventricular relaxation (grade 1 diastolic    dysfunction). - Atrial septum: No defect or patent foramen ovale was identified.  EKG:  EKG ***  Recent Labs: 03/22/2019: ALT 21; BUN 11; Creatinine, Ser 0.80; Hemoglobin 15.5; Platelets 267; Potassium 3.8; Sodium 137  Recent Lipid Panel No results found for: CHOL, TRIG, HDL, CHOLHDL, VLDL, LDLCALC, LDLDIRECT  Physical Exam:    VS:  There were no vitals taken for this visit.    Wt Readings from Last 3 Encounters:  11/06/19 163 lb 9.6 oz (74.2 kg)  07/06/19 162 lb (73.5 kg)  01/05/18 160 lb (72.6 kg)     GEN: ***. No acute distress HEENT: Normal NECK: No JVD. LYMPHATICS: No lymphadenopathy CARDIAC: *** RRR without murmur, gallop, or edema. VASCULAR: *** Normal Pulses. No bruits. RESPIRATORY:  Clear to auscultation without rales, wheezing or rhonchi  ABDOMEN: Soft, non-tender, non-distended, No pulsatile mass, MUSCULOSKELETAL: No deformity  SKIN: Warm and dry NEUROLOGIC:  Alert and oriented x 3 PSYCHIATRIC:  Normal affect   ASSESSMENT:    1. Palpitations   2. Other chest pain   3. Essential hypertension   4. Educated about COVID-19 virus infection    PLAN:    In order of problems listed above:  1. ***   Medication Adjustments/Labs and Tests Ordered: Current medicines are reviewed at length with the patient today.  Concerns regarding medicines are outlined above.  No orders of the defined types were placed in this encounter.  No orders of the defined types were placed in this encounter.   There are no Patient Instructions on file for this visit.   Signed, Sinclair Grooms, MD  11/18/2019 1:43 PM    [facility]

## 2019-11-21 ENCOUNTER — Ambulatory Visit: Payer: Medicare Other | Admitting: Interventional Cardiology

## 2019-11-27 ENCOUNTER — Encounter (HOSPITAL_COMMUNITY): Payer: Medicare Other

## 2019-11-28 ENCOUNTER — Other Ambulatory Visit: Payer: Self-pay

## 2019-11-28 ENCOUNTER — Ambulatory Visit (HOSPITAL_COMMUNITY)
Admission: RE | Admit: 2019-11-28 | Discharge: 2019-11-28 | Disposition: A | Discharge status: Home / Self Care | Payer: Medicare Other | Source: Ambulatory Visit | Attending: Neurology | Admitting: Neurology

## 2019-11-28 ENCOUNTER — Telehealth: Payer: Self-pay

## 2019-11-28 DIAGNOSIS — G459 Transient cerebral ischemic attack, unspecified: Secondary | ICD-10-CM | POA: Insufficient documentation

## 2019-11-28 NOTE — Telephone Encounter (Signed)
Called spoke with patient she was informed of results and understands.

## 2019-11-28 NOTE — Progress Notes (Signed)
Carotid artery duplex has been completed. Preliminary results can be found in CV Proc through chart review.   11/28/19 9:11 AM Katie Potts RVT

## 2019-11-28 NOTE — Telephone Encounter (Signed)
-----   Message from Pieter Partridge, DO sent at 11/28/2019  9:57 AM EST ----- Carotid ultrasound looks okay

## 2020-01-04 ENCOUNTER — Telehealth: Payer: Self-pay | Admitting: Interventional Cardiology

## 2020-01-04 NOTE — Telephone Encounter (Signed)
Pt requested to switch providers from Dr. Acie Fredrickson to Dr. Tamala Julian back in June. It was approved by both providers   Nahser, Wonda Cheng, MD  Elenora Gamma, MD   Fine with me   PN    ----- Message ----- From: Belva Crome, MD Sent: 07/11/2019  11:32 AM EST To: Johnna Acosta Subject: RE: Provider Change                            Okay. Will be quite some time before I have an opening.   --- Message ----- From: Johnna Acosta Sent: 07/10/2019  10:18 AM EDT To: Belva Crome, MD, Thayer Headings, MD Subject: Provider Change                                 The patient called and requested a provider change. She is currently a patient of Dr, Acie Fredrickson, and would like to be treated by Dr. Tamala Julian.  Is it ok with both of you that she transfers care?   Thank you , Tyler Pita

## 2020-02-04 ENCOUNTER — Ambulatory Visit: Payer: Medicare Other | Admitting: Interventional Cardiology

## 2020-02-04 ENCOUNTER — Encounter: Payer: Self-pay | Admitting: Interventional Cardiology

## 2020-02-04 ENCOUNTER — Encounter (INDEPENDENT_AMBULATORY_CARE_PROVIDER_SITE_OTHER): Payer: Self-pay

## 2020-02-04 ENCOUNTER — Telehealth: Payer: Self-pay | Admitting: *Deleted

## 2020-02-04 ENCOUNTER — Other Ambulatory Visit: Payer: Self-pay

## 2020-02-04 VITALS — BP 146/78 | HR 74 | Ht 61.0 in | Wt 166.1 lb

## 2020-02-04 DIAGNOSIS — R002 Palpitations: Secondary | ICD-10-CM

## 2020-02-04 DIAGNOSIS — I6523 Occlusion and stenosis of bilateral carotid arteries: Secondary | ICD-10-CM

## 2020-02-04 DIAGNOSIS — R0789 Other chest pain: Secondary | ICD-10-CM

## 2020-02-04 DIAGNOSIS — I5189 Other ill-defined heart diseases: Secondary | ICD-10-CM

## 2020-02-04 DIAGNOSIS — E782 Mixed hyperlipidemia: Secondary | ICD-10-CM

## 2020-02-04 DIAGNOSIS — I10 Essential (primary) hypertension: Secondary | ICD-10-CM

## 2020-02-04 DIAGNOSIS — Z7189 Other specified counseling: Secondary | ICD-10-CM

## 2020-02-04 NOTE — Progress Notes (Signed)
Cardiology Office Note:    Date:  02/04/2020   ID:  Katie Potts, DOB 02/07/1951, MRN [id number]  PCP:  Maurice Small, MD  Cardiologist:  No primary care provider on file.   Referring MD: Maurice Small, MD   Chief Complaint  Patient presents with  . Hypertension  . Hyperlipidemia    History of Present Illness:    Katie Potts is a 69 y.o. female with a hx of hyperlipidemia, hypertension, chronic diastolic heart failure, and palpitations.  Previously followed by Dr. Mertie Moores.  Previously seen by Dr. Acie Fredrickson.  Longstanding history of palpitations.  Also now gives a history of occasional chest pressure, that occurs predominantly when recumbent.  These episodes last seconds to a minute before resolving.  Happens 2-3 times per week.  There is no exertional component.  Has history of carotid plaque.  There is also hypertension and hyperlipidemia.  She denies claudication.  Palpitations last moment's and it feels as if her heart skips.  These also noted more at rest but can occur with physical activity.  Past Medical History:  Diagnosis Date  . Asthma   . Depression    MAJOR DEPRESSION, DR. KAUR;PSYCHIATRIST  . HTN (hypertension) 07/06/2019  . Hyperlipidemia     Past Surgical History:  Procedure Laterality Date  . BREAST BIOPSY    . BREAST EXCISIONAL BIOPSY    . BREAST SURGERY    . REDUCTION MAMMAPLASTY Bilateral    30 years ago    Current Medications: Current Meds  Medication Sig  . amLODipine (NORVASC) 5 MG tablet Take 1 tablet by mouth daily.  Marland Kitchen aspirin EC 81 MG tablet Take 81 mg by mouth daily.  . clonazePAM (KLONOPIN) 1 MG tablet Take 1 mg by mouth 2 (two) times daily as needed.   Marland Kitchen FLUoxetine (PROZAC) 10 MG capsule Take 1 capsule by mouth daily.  . pravastatin (PRAVACHOL) 10 MG tablet Take 1 tablet by mouth daily.  Marland Kitchen zolpidem (AMBIEN) 10 MG tablet Take 1 tablet by mouth as needed.     Allergies:   Seroquel [quetiapine fumarate]   Social History    Socioeconomic History  . Marital status: Single    Spouse name: Not on file  . Number of children: 1  . Years of education: Not on file  . Highest education level: Some college, no degree  Occupational History  . Not on file  Tobacco Use  . Smoking status: Never Smoker  . Smokeless tobacco: Never Used  Substance and Sexual Activity  . Alcohol use: No    Alcohol/week: 0.0 standard drinks  . Drug use: No  . Sexual activity: Not Currently  Other Topics Concern  . Not on file  Social History Narrative  . Not on file   Social Determinants of Health   Financial Resource Strain:   . Difficulty of Paying Living Expenses: Not on file  Food Insecurity:   . Worried About Charity fundraiser in the Last Year: Not on file  . Ran Out of Food in the Last Year: Not on file  Transportation Needs:   . Lack of Transportation (Medical): Not on file  . Lack of Transportation (Non-Medical): Not on file  Physical Activity:   . Days of Exercise per Week: Not on file  . Minutes of Exercise per Session: Not on file  Stress:   . Feeling of Stress : Not on file  Social Connections:   . Frequency of Communication with Friends and Family: Not on file  .  Frequency of Social Gatherings with Friends and Family: Not on file  . Attends Religious Services: Not on file  . Active Member of Clubs or Organizations: Not on file  . Attends Archivist Meetings: Not on file  . Marital Status: Not on file     Family History: The patient's family history includes AAA (abdominal aortic aneurysm) in her father; Breast cancer in her cousin and sister; Cancer in her father and mother; Colon cancer in her paternal uncle; Deep vein thrombosis in her father; Diabetes in her father; Heart attack in her brother; Heart disease in her father; Hyperlipidemia in her father, mother, sister, and sister; Hypertension in her brother, father, mother, sister, and sister; Mental illness in her sister; Pancreatic cancer in  her mother; Peripheral vascular disease in her father; Varicose Veins in her mother.  ROS:   Please see the history of present illness.    Has fatty deposits on both lower extremities.  She is concerned about carotid plaque.  Has anxiety concerning her vascular health.  Has family history of sarcoidosis.  All other systems reviewed and are negative.  EKGs/Labs/Other Studies Reviewed:    The following studies were reviewed today: Bilateral carotid ultrasound 2020: Summary:  Right Carotid: Velocities in the right ICA are consistent with a 1-39%  stenosis.   Left Carotid: Velocities in the left ICA are consistent with a 1-39%  stenosis.   Vertebrals: Bilateral vertebral arteries demonstrate antegrade flow.   *See table(s) above for measurements and observations.   30-day monitor worn for 2 days due to skin allergy: 2018 No significant abnormality was noted other than sinus rhythm over 48 hours.  2-D Doppler Echocardiogram 2018: Study Conclusions   - Left ventricle: The cavity size was normal. Wall thickness was  increased in a pattern of moderate LVH. Systolic function was  normal. The estimated ejection fraction was in the range of 60%  to 65%. Wall motion was normal; there were no regional wall  motion abnormalities. Doppler parameters are consistent with  abnormal left ventricular relaxation (grade 1 diastolic  dysfunction).  - Atrial septum: No defect or patent foramen ovale was identified.   EKG:  EKG normal sinus rhythm with normal EKG appearance.  Recent Labs: 03/22/2019: ALT 21; BUN 11; Creatinine, Ser 0.80; Hemoglobin 15.5; Platelets 267; Potassium 3.8; Sodium 137  Recent Lipid Panel No results found for: CHOL, TRIG, HDL, CHOLHDL, VLDL, LDLCALC, LDLDIRECT  Physical Exam:    VS:  BP (!) 146/78   Pulse 74   Ht 5\' 1"  (1.549 m)   Wt 166 lb 1.9 oz (75.4 kg)   SpO2 97%   BMI 31.39 kg/m     Wt Readings from Last 3 Encounters:  02/04/20 166 lb 1.9 oz  (75.4 kg)  11/06/19 163 lb 9.6 oz (74.2 kg)  07/06/19 162 lb (73.5 kg)     GEN: Moderate obesity. No acute distress HEENT: Normal NECK: No JVD. LYMPHATICS: No lymphadenopathy CARDIAC:  RRR without murmur, gallop, or edema. VASCULAR:  Normal Pulses. No bruits. RESPIRATORY:  Clear to auscultation without rales, wheezing or rhonchi  ABDOMEN: Soft, non-tender, non-distended, No pulsatile mass, MUSCULOSKELETAL: No deformity  SKIN: Warm and dry NEUROLOGIC:  Alert and oriented x 3 PSYCHIATRIC:  Normal affect   ASSESSMENT:    1. Mixed hyperlipidemia   2. Essential hypertension   3. Palpitations   4. Diastolic dysfunction   5. Educated about COVID-19 virus infection   6. Atherosclerosis of both carotid arteries   7. Chest  tightness    PLAN:    In order of problems listed above:  1. Severe hyperlipidemia with LDL of 189 while taking pravastatin 10 mg/day.  This suggests familial hypercholesterolemia. 2. Blood pressure is not adequately controlled. 3. Palpitations of uncertain origin.  Rule out atrial fibrillation.  30-day monitor is needed. 4. No symptoms to suggest volume overload 5. COVID-19 preventive measures are being adhered to with 3W's.  She is interested in getting the vaccine. 6. Needs an LDL cholesterol less than 70. 7. Coronary calcium score is ordered for atypical chest discomfort.   Medication Adjustments/Labs and Tests Ordered: Current medicines are reviewed at length with the patient today.  Concerns regarding medicines are outlined above.  Orders Placed This Encounter  Procedures  . CT CARDIAC SCORING  . Cardiac event monitor  . EKG 12-Lead   No orders of the defined types were placed in this encounter.   Patient Instructions  Medication Instructions:  Your physician recommends that you continue on your current medications as directed. Please refer to the Current Medication list given to you today.  *If you need a refill on your cardiac medications  before your next appointment, please call your pharmacy*  Lab Work: None If you have labs (blood work) drawn today and your tests are completely normal, you will receive your results only by: Marland Kitchen MyChart Message (if you have MyChart) OR . A paper copy in the mail If you have any lab test that is abnormal or we need to change your treatment, we will call you to review the results.  Testing/Procedures: Your physician has recommended that you wear an event monitor. Event monitors are medical devices that record the heart's electrical activity. Doctors most often [place] these monitors to diagnose arrhythmias. Arrhythmias are problems with the speed or rhythm of the heartbeat. The monitor is a small, portable device. You can wear one while you do your normal daily activities. This is usually used to diagnose what is causing palpitations/syncope (passing out).  Your physician recommends that you have a Calcium Score performed.   Follow-Up: At [facility], you and your health needs are our priority.  As part of our continuing mission to provide you with exceptional heart care, we have created designated Provider Care Teams.  These Care Teams include your primary Cardiologist (physician) and Advanced Practice Providers (APPs -  Physician Assistants and Nurse Practitioners) who all work together to provide you with the care you need, when you need it.  Your next appointment:   8 week(s)   The format for your next appointment:   In Person  Provider:   You may see Dr. Daneen Schick or one of the following Advanced Practice Providers on your designated Care Team:    Truitt Merle, NP  Cecilie Kicks, NP  Kathyrn Drown, NP   Other Instructions      Signed, Sinclair Grooms, MD  02/04/2020 10:27 AM    [place]

## 2020-02-04 NOTE — Telephone Encounter (Signed)
Patient enrolled for [employer] to ship a 30 day cardiac event monitor to the patients home.  Sensitive skin electrodes requested.

## 2020-02-04 NOTE — Patient Instructions (Addendum)
Medication Instructions:  Your physician recommends that you continue on your current medications as directed. Please refer to the Current Medication list given to you today.  *If you need a refill on your cardiac medications before your next appointment, please call your pharmacy*  Lab Work: None If you have labs (blood work) drawn today and your tests are completely normal, you will receive your results only by: Marland Kitchen MyChart Message (if you have MyChart) OR . A paper copy in the mail If you have any lab test that is abnormal or we need to change your treatment, we will call you to review the results.  Testing/Procedures: Your physician has recommended that you wear an event monitor. Event monitors are medical devices that record the heart's electrical activity. Doctors most often [place] these monitors to diagnose arrhythmias. Arrhythmias are problems with the speed or rhythm of the heartbeat. The monitor is a small, portable device. You can wear one while you do your normal daily activities. This is usually used to diagnose what is causing palpitations/syncope (passing out).  Your physician recommends that you have a Calcium Score performed.   Follow-Up: At [facility], you and your health needs are our priority.  As part of our continuing mission to provide you with exceptional heart care, we have created designated Provider Care Teams.  These Care Teams include your primary Cardiologist (physician) and Advanced Practice Providers (APPs -  Physician Assistants and Nurse Practitioners) who all work together to provide you with the care you need, when you need it.  Your next appointment:   8 week(s)   The format for your next appointment:   In Person  Provider:   You may see Dr. Daneen Schick or one of the following Advanced Practice Providers on your designated Care Team:    Truitt Merle, NP  Cecilie Kicks, NP  Kathyrn Drown, NP   Other Instructions

## 2020-02-14 ENCOUNTER — Encounter: Payer: Self-pay | Admitting: Interventional Cardiology

## 2020-02-14 NOTE — Telephone Encounter (Signed)
Answered patient questions regarding instructions for Cardiac event monitor.   Advised patient to contact [employer] at [phone] billing , to set up payment for any balance after insurance.  [employer] will set up payment plans up for 12 months.

## 2020-02-14 NOTE — Telephone Encounter (Signed)
Follow up  Patient has questions about the monitor. Please call.

## 2020-02-14 NOTE — Telephone Encounter (Signed)
error 

## 2020-02-21 ENCOUNTER — Ambulatory Visit (INDEPENDENT_AMBULATORY_CARE_PROVIDER_SITE_OTHER): Payer: Medicare Other

## 2020-02-21 DIAGNOSIS — R002 Palpitations: Secondary | ICD-10-CM

## 2020-02-28 ENCOUNTER — Telehealth: Payer: Self-pay | Admitting: *Deleted

## 2020-02-28 NOTE — Telephone Encounter (Signed)
[employer] contacted to ship overnight base and sensitive skin electrodes.  Patient instructed to remove monitor until new supplies arrive.

## 2020-02-28 NOTE — Telephone Encounter (Signed)
Patient states that she couldn't wear her heart monitor due to it breaking it her out. She wants to know if she should just go ahead and send it back?

## 2020-03-25 ENCOUNTER — Other Ambulatory Visit: Payer: Self-pay

## 2020-03-25 ENCOUNTER — Encounter (INDEPENDENT_AMBULATORY_CARE_PROVIDER_SITE_OTHER): Payer: Self-pay

## 2020-03-25 ENCOUNTER — Ambulatory Visit (INDEPENDENT_AMBULATORY_CARE_PROVIDER_SITE_OTHER)
Admission: RE | Admit: 2020-03-25 | Discharge: 2020-03-25 | Disposition: A | Discharge status: Home / Self Care | Payer: Self-pay | Source: Ambulatory Visit | Attending: Interventional Cardiology | Admitting: Interventional Cardiology

## 2020-03-25 DIAGNOSIS — I10 Essential (primary) hypertension: Secondary | ICD-10-CM

## 2020-03-25 DIAGNOSIS — E782 Mixed hyperlipidemia: Secondary | ICD-10-CM

## 2020-03-28 ENCOUNTER — Telehealth: Payer: Self-pay | Admitting: *Deleted

## 2020-03-28 DIAGNOSIS — E782 Mixed hyperlipidemia: Secondary | ICD-10-CM

## 2020-03-28 MED ORDER — ROSUVASTATIN CALCIUM 20 MG PO TABS: 20.0000 mg | ORAL_TABLET | Freq: Every day | ORAL | 3 refills | Status: DC

## 2020-03-28 NOTE — Telephone Encounter (Signed)
-----   Message from Katie Crome, MD sent at 03/27/2020  7:56 AM EDT ----- Let the patient know the coronary calcium score is elevated but still in the relatively low risk range.  Given current age and risk factors, very aggressive cholesterol lowering is recommended.  The calcium score is 54.  This value is 79th percentile for sex matched controls (in other words, she has a higher calcium score than 79% of females her age.  Another way to look at it is that only 21% of females her age have more calcification that she does.)  I would recommend referral to the lipid clinic to begin the process of lipid-lowering but would recommend switching to rosuvastatin 20 mg/day with 80 liver and lipid panel in 6 weeks.  The lipid clinic visit should occur after that blood work is done. A copy will be sent to Maurice Small, MD

## 2020-03-28 NOTE — Telephone Encounter (Signed)
Spoke with pt and reviewed results and recommendations. Pt verbalized understanding and was in agreement with plan.  Pt will have labs drawn 5/24 and will see Lipid Clinic on 5/26.  Pt verbalized understanding and was appreciative for call.

## 2020-04-01 NOTE — Progress Notes (Signed)
Cardiology Office Note:    Date:  04/02/2020   ID:  Katie Potts, DOB 1951/10/05, MRN [id number]  PCP:  Maurice Small, MD  Cardiologist:  Sinclair Grooms, MD   Referring MD: Maurice Small, MD   Chief Complaint  Patient presents with  . Irregular Heart Beat    History of Present Illness:    Katie Potts is a 69 y.o. female with a hx of hyperlipidemia, hypertension, chronic diastolic heart failure, and palpitations.    Her 17 year old son was murdered in 1994 during his freshman year in college.  She lives in [place] without any family nearby.  The pandemic has caused isolation and depression.  When seen earlier this year there was complaint of palpitations and atypical chest pain.  We prescribed a monitor which she wore 7 out of the 21 days that she had the device.  There were multiple complaints during that timeframe of palpitations, chest pain, breathlessness, fatigue but no EKG/rhythm disturbance was noted.  She now states that she feels relatively well.  Palpitation frequency has significantly decreased.  She denies orthopnea, PND, lower extremity swelling, palpitations, and syncope.  Past Medical History:  Diagnosis Date  . Asthma   . Depression    MAJOR DEPRESSION, DR. KAUR;PSYCHIATRIST  . HTN (hypertension) 07/06/2019  . Hyperlipidemia     Past Surgical History:  Procedure Laterality Date  . BREAST BIOPSY    . BREAST EXCISIONAL BIOPSY    . BREAST SURGERY    . REDUCTION MAMMAPLASTY Bilateral    30 years ago    Current Medications: Current Meds  Medication Sig  . amLODipine (NORVASC) 5 MG tablet Take 1 tablet by mouth daily.  Marland Kitchen aspirin EC 81 MG tablet Take 81 mg by mouth daily.  . clonazePAM (KLONOPIN) 1 MG tablet Take 1 mg by mouth 2 (two) times daily as needed.   Marland Kitchen FLUoxetine (PROZAC) 10 MG capsule Take 1 capsule by mouth daily.  . rosuvastatin (CRESTOR) 20 MG tablet Take 1 tablet (20 mg total) by mouth daily.  Marland Kitchen zolpidem (AMBIEN) 10 MG tablet Take 1 tablet  by mouth as needed.     Allergies:   Seroquel [quetiapine fumarate]   Social History   Socioeconomic History  . Marital status: Single    Spouse name: Not on file  . Number of children: 1  . Years of education: Not on file  . Highest education level: Some college, no degree  Occupational History  . Not on file  Tobacco Use  . Smoking status: Never Smoker  . Smokeless tobacco: Never Used  Substance and Sexual Activity  . Alcohol use: No    Alcohol/week: 0.0 standard drinks  . Drug use: No  . Sexual activity: Not Currently  Other Topics Concern  . Not on file  Social History Narrative  . Not on file   Social Determinants of Health   Financial Resource Strain:   . Difficulty of Paying Living Expenses:   Food Insecurity:   . Worried About Charity fundraiser in the Last Year:   . Arboriculturist in the Last Year:   Transportation Needs:   . Film/video editor (Medical):   Marland Kitchen Lack of Transportation (Non-Medical):   Physical Activity:   . Days of Exercise per Week:   . Minutes of Exercise per Session:   Stress:   . Feeling of Stress :   Social Connections:   . Frequency of Communication with Friends and Family:   . Frequency  of Social Gatherings with Friends and Family:   . Attends Religious Services:   . Active Member of Clubs or Organizations:   . Attends Archivist Meetings:   Marland Kitchen Marital Status:      Family History: The patient's family history includes AAA (abdominal aortic aneurysm) in her father; Breast cancer in her cousin and sister; Cancer in her father and mother; Colon cancer in her paternal uncle; Deep vein thrombosis in her father; Diabetes in her father; Heart attack in her brother; Heart disease in her father; Hyperlipidemia in her father, mother, sister, and sister; Hypertension in her brother, father, mother, sister, and sister; Mental illness in her sister; Pancreatic cancer in her mother; Peripheral vascular disease in her father; Varicose  Veins in her mother.  ROS:   Please see the history of present illness.    Depression as mentioned before.  Sees a psychiatrist.  All other systems reviewed and are negative.  EKGs/Labs/Other Studies Reviewed:    The following studies were reviewed today: Continuous Monitor 02/2020: Study Highlights    Normal sinus rhythm and sinus bradycardia  Symptoms of flutter, tired, fatigue, etc does not correlate with arrhythmia.   Normal     EKG:  EKG not repeated  Recent Labs: No results found for requested labs within last 8760 hours.  Recent Lipid Panel No results found for: CHOL, TRIG, HDL, CHOLHDL, VLDL, LDLCALC, LDLDIRECT  Physical Exam:    VS:  BP 132/68   Pulse 72   Ht 5\' 1"  (1.549 m)   Wt 163 lb 12.8 oz (74.3 kg)   SpO2 96%   BMI 30.95 kg/m     Wt Readings from Last 3 Encounters:  04/02/20 163 lb 12.8 oz (74.3 kg)  02/04/20 166 lb 1.9 oz (75.4 kg)  11/06/19 163 lb 9.6 oz (74.2 kg)     GEN: Appears younger than stated age. No acute distress HEENT: Normal NECK: No JVD. LYMPHATICS: No lymphadenopathy CARDIAC:  RRR without murmur, gallop, or edema. VASCULAR:  Normal Pulses. No bruits. RESPIRATORY:  Clear to auscultation without rales, wheezing or rhonchi  ABDOMEN: Soft, non-tender, non-distended, No pulsatile mass, MUSCULOSKELETAL: No deformity  SKIN: Warm and dry NEUROLOGIC:  Alert and oriented x 3 PSYCHIATRIC:  Normal affect   ASSESSMENT:    1. Palpitations   2. Diastolic dysfunction   3. Mixed hyperlipidemia   4. Essential hypertension   5. Atherosclerosis of both carotid arteries   6. Educated about COVID-19 virus infection    PLAN:    In order of problems listed above:  1. No arrhythmia identified after 7 days of continuous monitoring.  Monitoring was interrupted because the patient had skin irritation related to the adhesive. 2. No shortness of breath or evidence of volume overload to suggest heart failure. 3. We did not discuss  lipids. 4. Target blood pressure is 130/80 mmHg.  Continue current therapy. 5. Lipid therapy to prevent progression of atherosclerosis.  Target LDL less than 70. 6. COVID-19 vaccine is been received.  Social distancing and mask wearing is being practiced.  Overall education and awareness concerning primary risk prevention was discussed in detail: LDL less than 70, hemoglobin A1c less than 7, blood pressure target less than 130/80 mmHg, >150 minutes of moderate aerobic activity per week, avoidance of smoking, weight control (via diet and exercise), and continued surveillance/management of/for obstructive sleep apnea.    Medication Adjustments/Labs and Tests Ordered: Current medicines are reviewed at length with the patient today.  Concerns regarding medicines are  outlined above.  No orders of the defined types were placed in this encounter.  No orders of the defined types were placed in this encounter.   Patient Instructions  Medication Instructions:  Your physician recommends that you continue on your current medications as directed. Please refer to the Current Medication list given to you today.  *If you need a refill on your cardiac medications before your next appointment, please call your pharmacy*   Lab Work: None If you have labs (blood work) drawn today and your tests are completely normal, you will receive your results only by: Marland Kitchen MyChart Message (if you have MyChart) OR . A paper copy in the mail If you have any lab test that is abnormal or we need to change your treatment, we will call you to review the results.   Testing/Procedures: None   Follow-Up: At [facility], you and your health needs are our priority.  As part of our continuing mission to provide you with exceptional heart care, we have created designated Provider Care Teams.  These Care Teams include your primary Cardiologist (physician) and Advanced Practice Providers (APPs -  Physician Assistants and Nurse  Practitioners) who all work together to provide you with the care you need, when you need it.  We recommend signing up for the patient portal called "MyChart".  Sign up information is provided on this After Visit Summary.  MyChart is used to connect with patients for Virtual Visits (Telemedicine).  Patients are able to view lab/test results, encounter notes, upcoming appointments, etc.  Non-urgent messages can be sent to your provider as well.   To learn more about what you can do with MyChart, go to [email or web address].    Your next appointment:   As needed  The format for your next appointment:   In Person  Provider:   You may see Sinclair Grooms, MD or one of the following Advanced Practice Providers on your designated Care Team:    Truitt Merle, NP  Cecilie Kicks, NP  Kathyrn Drown, NP    Other Instructions      Signed, Sinclair Grooms, MD  04/02/2020 11:13 AM    [place]

## 2020-04-02 ENCOUNTER — Other Ambulatory Visit: Payer: Self-pay

## 2020-04-02 ENCOUNTER — Encounter: Payer: Self-pay | Admitting: Interventional Cardiology

## 2020-04-02 ENCOUNTER — Ambulatory Visit: Payer: Medicare Other | Admitting: Interventional Cardiology

## 2020-04-02 VITALS — BP 132/68 | HR 72 | Ht 61.0 in | Wt 163.8 lb

## 2020-04-02 DIAGNOSIS — I6523 Occlusion and stenosis of bilateral carotid arteries: Secondary | ICD-10-CM

## 2020-04-02 DIAGNOSIS — E782 Mixed hyperlipidemia: Secondary | ICD-10-CM | POA: Diagnosis not present

## 2020-04-02 DIAGNOSIS — I10 Essential (primary) hypertension: Secondary | ICD-10-CM | POA: Diagnosis not present

## 2020-04-02 DIAGNOSIS — I5189 Other ill-defined heart diseases: Secondary | ICD-10-CM | POA: Diagnosis not present

## 2020-04-02 DIAGNOSIS — Z7189 Other specified counseling: Secondary | ICD-10-CM

## 2020-04-02 DIAGNOSIS — R002 Palpitations: Secondary | ICD-10-CM | POA: Diagnosis not present

## 2020-04-02 NOTE — Patient Instructions (Signed)
Medication Instructions:  Your physician recommends that you continue on your current medications as directed. Please refer to the Current Medication list given to you today.  *If you need a refill on your cardiac medications before your next appointment, please call your pharmacy*   Lab Work: None If you have labs (blood work) drawn today and your tests are completely normal, you will receive your results only by: . MyChart Message (if you have MyChart) OR . A paper copy in the mail If you have any lab test that is abnormal or we need to change your treatment, we will call you to review the results.   Testing/Procedures: None   Follow-Up: At [facility], you and your health needs are our priority.  As part of our continuing mission to provide you with exceptional heart care, we have created designated Provider Care Teams.  These Care Teams include your primary Cardiologist (physician) and Advanced Practice Providers (APPs -  Physician Assistants and Nurse Practitioners) who all work together to provide you with the care you need, when you need it.  We recommend signing up for the patient portal called "MyChart".  Sign up information is provided on this After Visit Summary.  MyChart is used to connect with patients for Virtual Visits (Telemedicine).  Patients are able to view lab/test results, encounter notes, upcoming appointments, etc.  Non-urgent messages can be sent to your provider as well.   To learn more about what you can do with MyChart, go to [email or web address].com.    Your next appointment:   As needed  The format for your next appointment:   In Person  Provider:   You may see Henry W Smith III, MD or one of the following Advanced Practice Providers on your designated Care Team:    Lori Gerhardt, NP  Laura Ingold, NP  Jill McDaniel, NP    Other Instructions   

## 2020-05-06 ENCOUNTER — Ambulatory Visit: Payer: Medicare Other | Admitting: Neurology

## 2020-05-12 ENCOUNTER — Other Ambulatory Visit: Payer: Self-pay

## 2020-05-12 ENCOUNTER — Other Ambulatory Visit: Payer: Medicare Other | Admitting: *Deleted

## 2020-05-12 DIAGNOSIS — E782 Mixed hyperlipidemia: Secondary | ICD-10-CM

## 2020-05-12 LAB — LIPID PANEL
Chol/HDL Ratio: 2.9 ratio (ref 0.0–4.4)
Cholesterol, Total: 178 mg/dL (ref 100–199)
HDL: 62 mg/dL (ref >39)
LDL Chol Calc (NIH): 102 mg/dL — ABNORMAL HIGH (ref 0–99)
Triglycerides: 78 mg/dL (ref 0–149)
VLDL Cholesterol Cal: 14 mg/dL (ref 5–40)

## 2020-05-12 LAB — HEPATIC FUNCTION PANEL
ALT: 15 IU/L (ref 0–32)
AST: 21 IU/L (ref 0–40)
Albumin: 4.2 g/dL (ref 3.8–4.8)
Alkaline Phosphatase: 138 IU/L — ABNORMAL HIGH (ref 48–121)
Bilirubin Total: 0.5 mg/dL (ref 0.0–1.2)
Bilirubin, Direct: 0.12 mg/dL (ref 0.00–0.40)
Total Protein: 6.9 g/dL (ref 6.0–8.5)

## 2020-05-13 ENCOUNTER — Telehealth: Payer: Self-pay | Admitting: *Deleted

## 2020-05-13 DIAGNOSIS — E782 Mixed hyperlipidemia: Secondary | ICD-10-CM

## 2020-05-13 MED ORDER — EZETIMIBE 10 MG PO TABS: 10.0000 mg | ORAL_TABLET | Freq: Every day | ORAL | 3 refills | Status: DC

## 2020-05-13 NOTE — Telephone Encounter (Signed)
Spoke with pt and reviewed results and recommendations per Dr. Tamala Julian.  Pt agreeable to plan.  She will come 7/7 for repeat labs.  Pt appreciative for call.

## 2020-05-13 NOTE — Telephone Encounter (Signed)
-----   Message from Belva Crome, MD sent at 05/13/2020  1:02 PM EDT ----- Let the patient know the bad cholesterol is still higher than we would like.  It needs to be near 70 but is currently 102.  I would recommend starting Zetia 10 mg/day and repeating a lipid panel and liver panel in 6 weeks. A copy will be sent to Maurice Small, MD

## 2020-05-14 ENCOUNTER — Ambulatory Visit: Payer: Medicare Other

## 2020-05-26 ENCOUNTER — Ambulatory Visit (INDEPENDENT_AMBULATORY_CARE_PROVIDER_SITE_OTHER): Payer: Medicare Other | Admitting: Pharmacist

## 2020-05-26 ENCOUNTER — Other Ambulatory Visit: Payer: Self-pay

## 2020-05-26 DIAGNOSIS — E782 Mixed hyperlipidemia: Secondary | ICD-10-CM

## 2020-05-26 MED ORDER — ROSUVASTATIN CALCIUM 20 MG PO TABS: 40.0000 mg | ORAL_TABLET | ORAL | 1 refills | Status: DC

## 2020-05-26 NOTE — Patient Instructions (Addendum)
It was nice meeting you today!   Your LDL goal is <70. Your current LDL is 102.   Continue exercising on your stationary bike daily. Try to cut back your soda intake to one glass daily. Continue your excellent diet.   Continue taking the ezetimibe 10 mg daily. Start taking the rosuvastatin as 2 of the 20 mg tablets (for a total of 40 mg) every other day as tolerated.  Call us if you cannot take the rosuvastatin at the increased dose or if any questions come up at [phone].  Follow up with [place] on July 7th at 09:30 am. You will have a lipid panel drawn at this date.

## 2020-05-26 NOTE — Progress Notes (Signed)
Patient ID: Katie Potts                 DOB: 1951-10-29                    MRN: [id number]     HPI: Katie Potts is a 69 y.o. female patient referred to lipid clinic by Dr. Tamala Julian. PMH is significant for HTN, CHF, HLD, and palpitations. She last saw Dr. Tamala Julian on 5/25 who recommended starting Zetia 10 mg daily due to an elevated LDL on lipid panel from 5/24. Current ASCVD 10-year risk score is 9.4%.   Katie Potts presents to the lipid clinic today ambulating well and in good spirits. She reports that she is taking ezetimibe 10 mg every day but has been taking rosuvastatin 20 mg as every other day since around 04/18/20 because daily administration caused her hands to become stiff. She does note a family history of arthritis in her mother and sister but reports the stiffness is better with every other day administration rather than daily. She is tolerating ezetimibe well. Her diet listed below is significant for two cups of soda daily. She has started to ride her stationary bike within the past few days and reports exercising for 30 minutes. She is motivated to lost weight and improve her health. She does note that as she is retired, cost is a consideration for her therapeutic plan.  Current Medications: rosuvastatin 20 mg every other day, ezetimibe 10 mg daily Intolerances: none. Was previously on pravastatin but was switching to a high intensity statin  Risk Factors: elevated LDL, HTN, family history of cardiac disease LDL goal: <70 per Dr. Tamala Julian due to patient risk factors and family history   Diet: first meal is around 12-1 and is typically a [place] sandwich with mustard, for dinner chicken salads, chicken with rice and beans. She enjoys fish. She drinks ~2 glasses of root beer or orange soda every day.   Exercise: just started to ride her stationary bike. Her plan is to exercise every day for about 30 minutes to burn 400 calories/day.   Family History: AAA, DVT, DM, heart disease, HLD, PVD, and  HTN in father, HLD and HTN mother and sisters, MI in brother at 63  Social History: no smoking history. Does drink alcohol about 1-2 times per month   Labs: 05/12/2020: LDL 102, Trgs 78, HDL 62 (was on rosuvastatin 20mg  every other day for ~1 month at this time) - AST 21, ALT 15, Alkaline phosphatase 138 (previously 131 on 03/22/2019)  Past Medical History:  Diagnosis Date  . Asthma   . Depression    MAJOR DEPRESSION, DR. KAUR;PSYCHIATRIST  . HTN (hypertension) 07/06/2019  . Hyperlipidemia     Current Outpatient Medications on File Prior to Visit  Medication Sig Dispense Refill  . amLODipine (NORVASC) 5 MG tablet Take 1 tablet by mouth daily.    Marland Kitchen aspirin EC 81 MG tablet Take 81 mg by mouth daily.    . clonazePAM (KLONOPIN) 1 MG tablet Take 1 mg by mouth 2 (two) times daily as needed.     . ezetimibe (ZETIA) 10 MG tablet Take 1 tablet (10 mg total) by mouth daily. 90 tablet 3  . FLUoxetine (PROZAC) 10 MG capsule Take 1 capsule by mouth daily.    . rosuvastatin (CRESTOR) 20 MG tablet Take 1 tablet (20 mg total) by mouth daily. 90 tablet 3  . zolpidem (AMBIEN) 10 MG tablet Take 1 tablet by mouth as needed.  No current facility-administered medications on file prior to visit.    Allergies  Allergen Reactions  . Seroquel [Quetiapine Fumarate] Swelling    Assessment/Plan:  1. Hyperlipidemia - elevated LDL of 102 (goal <70 per Dr. Tamala Julian) on rosuvastatin 20 mg every other day and ezetimibe 10 mg daily. Will attempt to increase rosuvastatin dose by increasing to 40 mg but continuing with every other day administration. Patient educated to try this new dose and to call Lipid Clinic if unable to tolerate and we may go back to 20 mg every other day. Continue her diet of protein and vegetables, but decrease soda intake to one glass/day. Patient is agreeable to trying 1/2 glass of soda twice daily and is considering diet soda. Continue new exercise regimen of 30 minutes/day of riding stationary  bike. Will follow up a lipid panel on 7/7. Educated patient that depending on LDL in July, we may consider use of a PCSK9-I if further lipid control necessary.     Eddie Candle, PharmD PGY-1 Pharmacy Resident

## 2020-06-25 ENCOUNTER — Other Ambulatory Visit: Payer: Medicare Other

## 2020-07-07 ENCOUNTER — Other Ambulatory Visit: Payer: Medicare Other | Admitting: *Deleted

## 2020-07-07 ENCOUNTER — Other Ambulatory Visit: Payer: Self-pay

## 2020-07-07 DIAGNOSIS — E782 Mixed hyperlipidemia: Secondary | ICD-10-CM

## 2020-07-07 LAB — LIPID PANEL
Chol/HDL Ratio: 2.8 ratio (ref 0.0–4.4)
Cholesterol, Total: 174 mg/dL (ref 100–199)
HDL: 63 mg/dL (ref >39)
LDL Chol Calc (NIH): 93 mg/dL (ref 0–99)
Triglycerides: 98 mg/dL (ref 0–149)
VLDL Cholesterol Cal: 18 mg/dL (ref 5–40)

## 2020-07-07 LAB — HEPATIC FUNCTION PANEL
ALT: 31 IU/L (ref 0–32)
AST: 28 IU/L (ref 0–40)
Albumin: 4.6 g/dL (ref 3.8–4.8)
Alkaline Phosphatase: 152 IU/L — ABNORMAL HIGH (ref 48–121)
Bilirubin Total: 0.6 mg/dL (ref 0.0–1.2)
Bilirubin, Direct: 0.14 mg/dL (ref 0.00–0.40)
Total Protein: 7.4 g/dL (ref 6.0–8.5)

## 2020-07-08 ENCOUNTER — Telehealth: Payer: Self-pay | Admitting: Pharmacist

## 2020-07-08 NOTE — Telephone Encounter (Signed)
LDL is now at goal of <100. Called and reviewed lab results with patient. She is taking rosuvastatin 42m (instead of 466m every other day and zetia daily. Continue rosuvastatin 3063mvery other day and zetia 58m77mily. AST/ALT normal. Alk phos slightly elevated. Should review with her PCP. Placed lab results in mail per pt request.

## 2020-07-25 ENCOUNTER — Other Ambulatory Visit: Payer: Self-pay | Admitting: Family Medicine

## 2020-07-30 ENCOUNTER — Other Ambulatory Visit: Payer: Self-pay | Admitting: Nurse Practitioner

## 2020-07-30 DIAGNOSIS — N644 Mastodynia: Secondary | ICD-10-CM

## 2020-08-07 ENCOUNTER — Other Ambulatory Visit: Payer: Medicare Other

## 2020-08-07 ENCOUNTER — Ambulatory Visit
Admission: RE | Admit: 2020-08-07 | Discharge: 2020-08-07 | Disposition: A | Discharge status: Home / Self Care | Payer: Medicare Other | Source: Ambulatory Visit | Attending: Nurse Practitioner | Admitting: Nurse Practitioner

## 2020-08-07 DIAGNOSIS — N644 Mastodynia: Secondary | ICD-10-CM

## 2020-08-18 ENCOUNTER — Other Ambulatory Visit: Payer: Medicare Other

## 2020-12-31 DIAGNOSIS — R0683 Snoring: Secondary | ICD-10-CM | POA: Diagnosis not present

## 2020-12-31 DIAGNOSIS — H049 Disorder of lacrimal system, unspecified: Secondary | ICD-10-CM | POA: Diagnosis not present

## 2020-12-31 DIAGNOSIS — I10 Essential (primary) hypertension: Secondary | ICD-10-CM | POA: Diagnosis not present

## 2021-01-22 ENCOUNTER — Other Ambulatory Visit: Payer: Self-pay

## 2021-02-05 ENCOUNTER — Ambulatory Visit: Payer: Medicare Other | Admitting: Cardiovascular Disease

## 2021-03-15 NOTE — Progress Notes (Signed)
Advanced Hypertension Clinic Initial Assessment:    Date:  03/16/2021   ID:  Katie Potts, DOB 08-14-1951, MRN [id number]  PCP:  Maurice Small, MD  Cardiologist:  Sinclair Grooms, MD  Nephrologist:  Referring MD: Glenis Smoker, *   CC: Hypertension  History of Present Illness:    Katie Potts is a 70 y.o. female with a hx of coronary calcification, hypertension, hyperlipidemia,TIA, hypokalemia, chronic diastolic heart failure, and palpitations  here to establish care in the hypertension clinic.  She was first diagnosed with hypertension in the 1990s around the time her son was murdered.  Since then her BP has been labile. She last saw Dr. Tamala Julian on 03/2020 with palpitations.  She wore an ambulatory monitor and experienced palpitations but no arrhythmias were noted on the monitor.  Blood pressures well-controlled at that time.  She had a coronary calcium score 03/2020 that was 54.  This was 78 percentile for age and gender.  Ms. Dutil has been working on her diet and limiting sweets.  She is trying to do this with her sister to keep her accountable.  She has also been working on portions.  She started exercising on Saturday by riding a bike for 30 minutes.  She had no exertional chest pain or shortness of breath.  She did have some chest pressure when laying down.  She does think that she snores and does not always feel well have been rested when she awakens in the morning.  This occurs intermittently.  She notes that when she first wakes up.  She is afraid because her brother had a heart attack recently at age 73.  Continues to have intermittently elevated blood pressures.  She notes that she gets a headache when her blood pressure is very high.  She continues to have palpitations where she feels like her heart is racing for several minutes at a time.  There is no associated lightheadedness or dizziness.  She has been cooking at home more and trying to limit her sodium intake.  She drinks  decaf coffee and does not use any supplements.  She sometimes has swelling in her hands but no orthopnea or PND.  Previous antihypertensives: Amlodipine-HA in the past.  Tolerates it now.  olmesartan- didn't work Losartan- didn't work HCTZ- ? hypokalemia  Past Medical History:  Diagnosis Date  . Asthma   . Chest pain of uncertain etiology 2/77/8242  . Depression    MAJOR DEPRESSION, DR. KAUR;PSYCHIATRIST  . HTN (hypertension) 07/06/2019  . Hyperlipidemia   . Hypokalemia 03/16/2021  . Snoring 03/16/2021    Past Surgical History:  Procedure Laterality Date  . BREAST BIOPSY    . BREAST EXCISIONAL BIOPSY    . BREAST SURGERY    . REDUCTION MAMMAPLASTY Bilateral    30 years ago    Current Medications: Current Meds  Medication Sig  . amLODipine (NORVASC) 5 MG tablet Take 5 mg by mouth daily.  Marland Kitchen aspirin EC 81 MG tablet Take 81 mg by mouth daily.  . carvedilol (COREG) 6.25 MG tablet Take 1 tablet (6.25 mg total) by mouth 2 (two) times daily.  . clonazePAM (KLONOPIN) 1 MG tablet Take 1 mg by mouth 2 (two) times daily as needed for anxiety.  . diphenhydrAMINE HCl (BENADRYL ALLERGY PO) Take 2 tablets by mouth as needed.  . ezetimibe (ZETIA) 10 MG tablet Take 1 tablet (10 mg total) by mouth daily.  Marland Kitchen FLUoxetine (PROZAC) 10 MG capsule Take 1 capsule by mouth daily.  Marland Kitchen  zolpidem (AMBIEN) 10 MG tablet Take 10 mg by mouth at bedtime as needed for sleep.     Allergies:   Amlodipine and Seroquel [quetiapine fumarate]   Social History   Socioeconomic History  . Marital status: Single    Spouse name: Not on file  . Number of children: 1  . Years of education: Not on file  . Highest education level: Some college, no degree  Occupational History  . Not on file  Tobacco Use  . Smoking status: Never Smoker  . Smokeless tobacco: Never Used  Vaping Use  . Vaping Use: Never used  Substance and Sexual Activity  . Alcohol use: No    Alcohol/week: 0.0 standard drinks  . Drug use: No  .  Sexual activity: Not Currently  Other Topics Concern  . Not on file  Social History Narrative  . Not on file   Social Determinants of Health   Financial Resource Strain: Low Risk   . Difficulty of Paying Living Expenses: Not hard at all  Food Insecurity: No Food Insecurity  . Worried About Charity fundraiser in the Last Year: Never true  . Ran Out of Food in the Last Year: Never true  Transportation Needs: No Transportation Needs  . Lack of Transportation (Medical): No  . Lack of Transportation (Non-Medical): No  Physical Activity: Insufficiently Active  . Days of Exercise per Week: 3 days  . Minutes of Exercise per Session: 30 min  Stress: Stress Concern Present  . Feeling of Stress : To some extent  Social Connections: Not on file     Family History: The patient's family history includes AAA (abdominal aortic aneurysm) in her father; Breast cancer in her cousin and sister; Cancer in her father and mother; Colon cancer in her paternal uncle; Deep vein thrombosis in her father; Diabetes in her father; Heart attack in her brother; Heart disease in her father; Hyperlipidemia in her father, mother, sister, and sister; Hypertension in her brother, father, mother, sister, and sister; Mental illness in her sister; Pancreatic cancer in her mother; Peripheral vascular disease in her father; Varicose Veins in her mother.  ROS:   Please see the history of present illness.   All other systems reviewed and are negative.  EKGs/Labs/Other Studies Reviewed:    EKG:  EKG is ordered today.  The ekg ordered today demonstrates sinus rhythm rate 76 bpm.    Recent Labs: 07/07/2020: ALT 31   Recent Lipid Panel    Component Value Date/Time   CHOL 174 07/07/2020 0834   TRIG 98 07/07/2020 0834   HDL 63 07/07/2020 0834   CHOLHDL 2.8 07/07/2020 0834   LDLCALC 93 07/07/2020 0834    Physical Exam:   VS:  BP 130/88 (BP Location: Right Arm, Patient Position: Sitting, Cuff Size: Normal)   Pulse 76    Ht 5\' 1"  (1.549 m)   Wt 163 lb 6.4 oz (74.1 kg)   BMI 30.87 kg/m  , BMI Body mass index is 30.87 kg/m. GENERAL:  Well appearing HEENT: Pupils equal round and reactive, fundi not visualized, oral mucosa unremarkable NECK:  No jugular venous distention, waveform within normal limits, carotid upstroke brisk and symmetric, no bruits, no thyromegaly LYMPHATICS:  No cervical adenopathy LUNGS:  Clear to auscultation bilaterally HEART:  RRR.  PMI not displaced or sustained,S1 and S2 within normal limits, no S3, no S4, no clicks, no rubs, no murmurs ABD:  Flat, positive bowel sounds normal in frequency in pitch, no bruits, no rebound,  no guarding, no midline pulsatile mass, no hepatomegaly, no splenomegaly EXT:  2 plus pulses throughout, no edema, no cyanosis no clubbing SKIN:  No rashes no nodules NEURO:  Cranial nerves II through XII grossly intact, motor grossly intact throughout PSYCH:  Cognitively intact, oriented to person place and time   ASSESSMENT:    1. Chest pain of uncertain etiology   2. Essential hypertension   3. Snoring   4. Daytime somnolence   5. Primary hypertension   6. Palpitations   7. Mixed hyperlipidemia   8. Hypokalemia     PLAN:    # Essential hypertension: # Hypokalemia:  Continue amlodipine and add carvedilol to 6.25 mg twice daily.  This should help with both blood pressure and palpitations.  Check renin and aldosterone given her hypokalemia.  She is interested in enrolling in the PREP program through the [facility].  Recommend 150 minutes of exercise weekly and limiting sodium to 1500 mg daily.  She was given a hypertension handbook and asked to check her blood pressures twice daily.  She will bring this to follow-up.  She will also get a sleep study for snoring and daytime somnolence.  Screening for Secondary Hypertension:  Causes 03/16/2021  Drugs/Herbals Screened  Renovascular HTN Not Screened  Sleep Apnea Screened     - Comments will get sleep study   Hyperthyroidism Screened  Hypothyroidism Screened  Hyperaldosteronism Screened     - Comments Check renin/aldo  Pheochromocytoma N/A  Cushing's Syndrome N/A  Hyperparathyroidism N/A  Coarctation of the Aorta Screened     - Comments BP symmetric  Compliance Screened    Relevant Labs/Studies: Basic Labs Latest Ref Rng & Units 03/22/2019 03/22/2019 11/15/2017  Sodium 135 - 145 mmol/L - 137 138  Potassium 3.5 - 5.1 mmol/L - 3.8 4.1  Creatinine 0.44 - 1.00 mg/dL 0.80 0.85 0.93     #Coronary calcification: #Atypical chest pain: Ms. Lukasik has chest pain upon awakening.  She has known coronary calcifications and her brother recently died of an MI at age 87.  We will get a coronary CT-A to better assess for obstructive coronary disease.  Need to address lipids based on this data.   Disposition:    FU with MD/PharmD in 1 month    Medication Adjustments/Labs and Tests Ordered: Current medicines are reviewed at length with the patient today.  Concerns regarding medicines are outlined above.  Orders Placed This Encounter  Procedures  . CT CORONARY MORPH W/CTA COR W/SCORE W/CA W/CM &/OR WO/CM  . CT CORONARY FRACTIONAL FLOW RESERVE DATA PREP  . CT CORONARY FRACTIONAL FLOW RESERVE FLUID ANALYSIS  . Aldosterone + renin activity w/ ratio  . Basic metabolic panel  . EKG 12-Lead  . Split night study   Meds ordered this encounter  Medications  . carvedilol (COREG) 6.25 MG tablet    Sig: Take 1 tablet (6.25 mg total) by mouth 2 (two) times daily.    Dispense:  180 tablet    Refill:  3     Signed, Skeet Latch, MD  03/16/2021 1:05 PM    [facility]

## 2021-03-16 ENCOUNTER — Other Ambulatory Visit: Payer: Self-pay

## 2021-03-16 ENCOUNTER — Ambulatory Visit: Payer: Medicare Other | Admitting: Cardiovascular Disease

## 2021-03-16 ENCOUNTER — Encounter: Payer: Self-pay | Admitting: Cardiovascular Disease

## 2021-03-16 VITALS — BP 130/88 | HR 76 | Ht 61.0 in | Wt 163.4 lb

## 2021-03-16 DIAGNOSIS — R079 Chest pain, unspecified: Secondary | ICD-10-CM | POA: Diagnosis not present

## 2021-03-16 DIAGNOSIS — R002 Palpitations: Secondary | ICD-10-CM

## 2021-03-16 DIAGNOSIS — R0683 Snoring: Secondary | ICD-10-CM | POA: Diagnosis not present

## 2021-03-16 DIAGNOSIS — E876 Hypokalemia: Secondary | ICD-10-CM

## 2021-03-16 DIAGNOSIS — E782 Mixed hyperlipidemia: Secondary | ICD-10-CM | POA: Diagnosis not present

## 2021-03-16 DIAGNOSIS — I10 Essential (primary) hypertension: Secondary | ICD-10-CM | POA: Diagnosis not present

## 2021-03-16 DIAGNOSIS — R400 Somnolence: Secondary | ICD-10-CM

## 2021-03-16 HISTORY — DX: Chest pain, unspecified: R07.9

## 2021-03-16 HISTORY — DX: Hypokalemia: E87.6

## 2021-03-16 HISTORY — DX: Snoring: R06.83

## 2021-03-16 MED ORDER — CARVEDILOL 6.25 MG PO TABS: 6.2500 mg | ORAL_TABLET | Freq: Two times a day (BID) | ORAL | 3 refills | Status: DC

## 2021-03-16 NOTE — Patient Instructions (Addendum)
Dermatology:  Dr. Marny Lowenstein in [place]  Medication Instructions:  START CARVEDILOL 6.25 MG TWICE A DAY   Things to try for constipation:  Miralax Benefiber Mild of Magnesia   Labwork: RENIN/ALDOSTERONE TODAY   BMET 1 WEEK PRIOR TO CARDIAC CT    Testing/Procedures: Your physician has recommended that you have a sleep study. This test records several body functions during sleep, including: brain activity, eye movement, oxygen and carbon dioxide blood levels, heart rate and rhythm, breathing rate and rhythm, the flow of air through your mouth and nose, snoring, body muscle movements, and chest and belly movement. ONCE YOUR INSURANCE HAS APPROVED THE OFFICE WILL CALL YOU TO ARRANGE  CARDIAC CT ONCE YOUR INSURANCE HAS BEEN REVIEWED THE OFFICE WILL CALL YOU TO ARRANGE  Follow-Up: 04/16/2021 AT 9:30 AM WITH PHARM D   You will receive a phone call from the PREP exercise and nutrition program to schedule an initial assessment.  Special Instructions:   MONITOR YOUR BLOOD PRESSURE TWICE A DAY, LOG IN THE BOOK PROVIDED. BRING THE BOOK AND YOUR BLOOD PRESSURE MACHINE TO YOUR FOLLOW UP IN 1 MONTH   Your cardiac CT will be scheduled at one of the below locations:   [facility] [address] [place], [place] [address] [phone]  [facility] [address] [place], [place] [address] [phone]  If scheduled at [facility], please arrive at the [facility] main entrance (entrance A) of [facility] 30 minutes prior to test start time. Proceed to the [facility] Department (first floor) to check-in and test prep.  If scheduled at [facility], please arrive 15 mins early for check-in and test prep.  Please follow these instructions carefully (unless otherwise directed):  Hold all erectile dysfunction medications at least 3 days (72 hrs) prior to  test.  On the Night Before the Test: . Be sure to Drink plenty of water. . Do not consume any caffeinated/decaffeinated beverages or chocolate 12 hours prior to your test. . Do not take any antihistamines 12 hours prior to your test. . If the patient has contrast allergy: ? Patient will need a prescription for Prednisone and very clear instructions (as follows): 1. Prednisone 50 mg - take 13 hours prior to test 2. Take another Prednisone 50 mg 7 hours prior to test 3. Take another Prednisone 50 mg 1 hour prior to test 4. Take Benadryl 50 mg 1 hour prior to test . Patient must complete all four doses of above prophylactic medications. . Patient will need a ride after test due to Benadryl.  On the Day of the Test: . Drink plenty of water until 1 hour prior to the test. . Do not eat any food 4 hours prior to the test. . You may take your regular medications prior to the test.  . Take metoprolol (Lopressor) two hours prior to test. . HOLD Furosemide/Hydrochlorothiazide morning of the test. . FEMALES- please wear underwire-free bra if available      After the Test: . Drink plenty of water. . After receiving IV contrast, you may experience a mild flushed feeling. This is normal. . On occasion, you may experience a mild rash up to 24 hours after the test. This is not dangerous. If this occurs, you can take Benadryl 25 mg and increase your fluid intake. . If you experience trouble breathing, this can be serious. If it is severe call 911 IMMEDIATELY.  If it is mild, please call our office. . If you take any of these medications: Glipizide/Metformin, Avandament, Glucavance, please do not take 48 hours after completing test unless otherwise instructed.   Once we have confirmed authorization from your insurance company, we will call you to set up a date and time for your test. Based on how quickly your insurance processes prior authorizations requests, please allow up to 4 weeks to be contacted for  scheduling your Cardiac CT appointment. Be advised that routine Cardiac CT appointments could be scheduled as many as 8 weeks after your provider has ordered it.  For non-scheduling related questions, please contact the cardiac imaging nurse navigator should you have any questions/concerns: Marchia Bond, Cardiac Imaging Nurse Navigator Gordy Clement, Cardiac Imaging Nurse Navigator [facility] Direct Office Dial: [phone]   For scheduling needs, including cancellations and rescheduling, please call [place], [phone].   DASH Eating Plan DASH stands for "Dietary Approaches to Stop Hypertension." The DASH eating plan is a healthy eating plan that has been shown to reduce high blood pressure (hypertension). It may also reduce your risk for type 2 diabetes, heart disease, and stroke. The DASH eating plan may also help with weight loss. What are tips for following this plan?  General guidelines  Avoid eating more than 2,300 mg (milligrams) of salt (sodium) a day. If you have hypertension, you may need to reduce your sodium intake to 1,500 mg a day.  Limit alcohol intake to no more than 1 drink a day for nonpregnant women and 2 drinks a day for men. One drink equals 12 oz of beer, 5 oz of wine, or 1 oz of hard liquor.  Work with your health care provider to maintain a healthy body weight or to lose weight. Ask what an ideal weight is for you.  Get at least 30 minutes of exercise that causes your heart to beat faster (aerobic exercise) most days of the week. Activities may include walking, swimming, or biking.  Work with your health care provider or diet and nutrition specialist (dietitian) to adjust your eating plan to your individual calorie needs. Reading food labels   Check food labels for the amount of sodium per serving. Choose foods with less than 5 percent of the Daily Value of sodium. Generally, foods with less than 300 mg of sodium per serving fit  into this eating plan.  To find whole grains, look for the word "whole" as the first word in the ingredient list. Shopping  Buy products labeled as "low-sodium" or "no salt added."  Buy fresh foods. Avoid canned foods and premade or frozen meals. Cooking  Avoid adding salt when cooking. Use salt-free seasonings or herbs instead of table salt or sea salt. Check with your health care provider or pharmacist before using salt substitutes.  Do not fry foods. Cook foods using healthy methods such as baking, boiling, grilling, and broiling instead.  Cook with heart-healthy oils, such as olive, canola, soybean, or sunflower oil. Meal planning  Eat a balanced diet that includes: ? 5 or more servings of fruits and vegetables each day. At each meal, try to fill half of your plate with fruits and vegetables. ? Up to 6-8 servings of whole grains each day. ? Less than 6 oz of lean meat, poultry, or fish each day. A 3-oz serving of meat is about the same size as a deck of cards. One egg equals 1 oz. ? 2 servings of low-fat dairy each day. ?  A serving of nuts, seeds, or beans 5 times each week. ? Heart-healthy fats. Healthy fats called Omega-3 fatty acids are found in foods such as flaxseeds and coldwater fish, like sardines, salmon, and mackerel.  Limit how much you eat of the following: ? Canned or prepackaged foods. ? Food that is high in trans fat, such as fried foods. ? Food that is high in saturated fat, such as fatty meat. ? Sweets, desserts, sugary drinks, and other foods with added sugar. ? Full-fat dairy products.  Do not salt foods before eating.  Try to eat at least 2 vegetarian meals each week.  Eat more home-cooked food and less restaurant, buffet, and fast food.  When eating at a restaurant, ask that your food be prepared with less salt or no salt, if possible. What foods are recommended? The items listed may not be a complete list. Talk with your dietitian about what dietary  choices are best for you. Grains Whole-grain or whole-wheat bread. Whole-grain or whole-wheat pasta. Brown rice. [place]. Bulgur. Whole-grain and low-sodium cereals. Pita bread. Low-fat, low-sodium crackers. Whole-wheat flour tortillas. Vegetables Fresh or frozen vegetables (raw, steamed, roasted, or grilled). Low-sodium or reduced-sodium tomato and vegetable juice. Low-sodium or reduced-sodium tomato sauce and tomato paste. Low-sodium or reduced-sodium canned vegetables. Fruits All fresh, dried, or frozen fruit. Canned fruit in natural juice (without added sugar). Meat and other protein foods Skinless chicken or [place]. Ground chicken or [place]. Pork with fat trimmed off. Fish and seafood. Egg whites. Dried beans, peas, or lentils. Unsalted nuts, nut butters, and seeds. Unsalted canned beans. Lean cuts of beef with fat trimmed off. Low-sodium, lean deli meat. Dairy Low-fat (1%) or fat-free (skim) milk. Fat-free, low-fat, or reduced-fat cheeses. Nonfat, low-sodium ricotta or cottage cheese. Low-fat or nonfat yogurt. Low-fat, low-sodium cheese. Fats and oils Soft margarine without trans fats. Vegetable oil. Low-fat, reduced-fat, or light mayonnaise and salad dressings (reduced-sodium). Canola, safflower, olive, soybean, and sunflower oils. Avocado. Seasoning and other foods Herbs. Spices. Seasoning mixes without salt. Unsalted popcorn and pretzels. Fat-free sweets. What foods are not recommended? The items listed may not be a complete list. Talk with your dietitian about what dietary choices are best for you. Grains Baked goods made with fat, such as croissants, muffins, or some breads. Dry pasta or rice meal packs. Vegetables Creamed or fried vegetables. Vegetables in a cheese sauce. Regular canned vegetables (not low-sodium or reduced-sodium). Regular canned tomato sauce and paste (not low-sodium or reduced-sodium). Regular tomato and vegetable juice (not low-sodium or reduced-sodium).  Angie Fava. Olives. Fruits Canned fruit in a light or heavy syrup. Fried fruit. Fruit in cream or butter sauce. Meat and other protein foods Fatty cuts of meat. Ribs. Fried meat. Berniece Salines. Sausage. Bologna and other processed lunch meats. Salami. Fatback. Hotdogs. Bratwurst. Salted nuts and seeds. Canned beans with added salt. Canned or smoked fish. Whole eggs or egg yolks. Chicken or [place] with skin. Dairy Whole or 2% milk, cream, and half-and-half. Whole or full-fat cream cheese. Whole-fat or sweetened yogurt. Full-fat cheese. Nondairy creamers. Whipped toppings. Processed cheese and cheese spreads. Fats and oils Butter. Stick margarine. Lard. Shortening. Ghee. Bacon fat. Tropical oils, such as coconut, palm kernel, or palm oil. Seasoning and other foods Salted popcorn and pretzels. Onion salt, garlic salt, seasoned salt, table salt, and sea salt. Worcestershire sauce. Tartar sauce. Barbecue sauce. Teriyaki sauce. Soy sauce, including reduced-sodium. Steak sauce. Canned and packaged gravies. Fish sauce. Oyster sauce. Cocktail sauce. Horseradish that you find on the shelf. Ketchup.  Mustard. Meat flavorings and tenderizers. Bouillon cubes. Hot sauce and Tabasco sauce. Premade or packaged marinades. Premade or packaged taco seasonings. Relishes. Regular salad dressings. Where to find more information:  National Heart, Lung, and [place]: https://wilson-eaton.com/  American Heart Association: www.heart.org Summary  The DASH eating plan is a healthy eating plan that has been shown to reduce high blood pressure (hypertension). It may also reduce your risk for type 2 diabetes, heart disease, and stroke.  With the DASH eating plan, you should limit salt (sodium) intake to 2,300 mg a day. If you have hypertension, you may need to reduce your sodium intake to 1,500 mg a day.  When on the DASH eating plan, aim to eat more fresh fruits and vegetables, whole grains, lean proteins, low-fat dairy, and  heart-healthy fats.  Work with your health care provider or diet and nutrition specialist (dietitian) to adjust your eating plan to your individual calorie needs. This information is not intended to replace advice given to you by your health care provider. Make sure you discuss any questions you have with your health care provider. Document Released: 11/25/2011 Document Revised: 11/18/2017 Document Reviewed: 11/29/2016 Elsevier Patient Education  2020 [employer].

## 2021-03-23 ENCOUNTER — Telehealth: Payer: Self-pay | Admitting: Cardiovascular Disease

## 2021-03-23 ENCOUNTER — Telehealth (HOSPITAL_COMMUNITY): Payer: Self-pay | Admitting: Emergency Medicine

## 2021-03-23 NOTE — Telephone Encounter (Signed)
Reaching out to patient to offer assistance regarding upcoming cardiac imaging study; pt verbalizes understanding of appt date/time, parking situation and where to check in, pre-test NPO status and medications ordered, and verified current allergies; name and call back number provided for further questions should they arise Marchia Bond RN [facility] [phone] office [phone] cell   Pt recently prescribed carvedilol BID, so new baseline HR unknown.  Instructed her to take as usual prior to scan Clarise Cruz

## 2021-03-23 NOTE — Telephone Encounter (Signed)
Katie Potts is calling stating she received a call last Thursday or Friday 03/19/21-03/20/21 that Dr. Oval Linsey is wanting labs. [name] is wanting to know what these labs are for due to just coming in for labs on 03/16/21 that Dr. Oval Linsey requested. Please advise.

## 2021-03-23 NOTE — Telephone Encounter (Signed)
Left message for pt to call, ? Where were the labs drawn? Nothing in chart showing labs were done.

## 2021-03-23 NOTE — Telephone Encounter (Signed)
Pt called back. Pt states that she had her labs drawn on 03/16/21. Per chart we are unable to see that these labs were collected or have resulted. Will follow up with lab, pt is concerned because she is having a CT scan on 03/25/21 for which they wanted the BMET that was ordered.

## 2021-03-24 DIAGNOSIS — I10 Essential (primary) hypertension: Secondary | ICD-10-CM | POA: Diagnosis not present

## 2021-03-24 LAB — BASIC METABOLIC PANEL
BUN/Creatinine Ratio: 13 (ref 12–28)
BUN: 12 mg/dL (ref 8–27)
CO2: 26 mmol/L (ref 20–29)
Calcium: 9.6 mg/dL (ref 8.7–10.3)
Chloride: 101 mmol/L (ref 96–106)
Creatinine, Ser: 0.95 mg/dL (ref 0.57–1.00)
Glucose: 94 mg/dL (ref 65–99)
Potassium: 4 mmol/L (ref 3.5–5.2)
Sodium: 141 mmol/L (ref 134–144)
eGFR: 66 mL/min/{1.73_m2} (ref >59)

## 2021-03-25 ENCOUNTER — Other Ambulatory Visit: Payer: Self-pay

## 2021-03-25 ENCOUNTER — Ambulatory Visit (HOSPITAL_COMMUNITY)
Admission: RE | Admit: 2021-03-25 | Discharge: 2021-03-25 | Disposition: A | Discharge status: Home / Self Care | Payer: Medicare Other | Source: Ambulatory Visit | Attending: Cardiovascular Disease | Admitting: Cardiovascular Disease

## 2021-03-25 DIAGNOSIS — R079 Chest pain, unspecified: Secondary | ICD-10-CM | POA: Diagnosis not present

## 2021-03-25 DIAGNOSIS — I10 Essential (primary) hypertension: Secondary | ICD-10-CM | POA: Insufficient documentation

## 2021-03-25 DIAGNOSIS — I700 Atherosclerosis of aorta: Secondary | ICD-10-CM | POA: Diagnosis not present

## 2021-03-25 DIAGNOSIS — I2510 Atherosclerotic heart disease of native coronary artery without angina pectoris: Secondary | ICD-10-CM | POA: Diagnosis not present

## 2021-03-25 MED ORDER — NITROGLYCERIN 0.4 MG SL SUBL
0.8000 mg | SUBLINGUAL_TABLET | Freq: Once | SUBLINGUAL | Status: AC
  Administered 2021-03-25: 0.8 mg via SUBLINGUAL

## 2021-03-25 MED ORDER — IOHEXOL 350 MG/ML SOLN
80.0000 mL | Freq: Once | INTRAVENOUS | Status: AC | PRN
  Administered 2021-03-25: 80 mL via INTRAVENOUS

## 2021-03-25 MED ORDER — NITROGLYCERIN 0.4 MG SL SUBL
SUBLINGUAL_TABLET | SUBLINGUAL | Status: AC
  Filled 2021-03-25: qty 2

## 2021-03-26 DIAGNOSIS — Z800 Family history of malignant neoplasm of digestive organs: Secondary | ICD-10-CM | POA: Diagnosis not present

## 2021-03-26 DIAGNOSIS — K5909 Other constipation: Secondary | ICD-10-CM | POA: Diagnosis not present

## 2021-03-31 NOTE — Telephone Encounter (Signed)
Pt is calling back requesting lab results from 03/16/2021 and 03/24/2021.She is also requesting her CT results from 03/25/2021.Please advise.

## 2021-03-31 NOTE — Telephone Encounter (Signed)
Spoke to patient Katie Potts and coronary ct results given.

## 2021-04-02 LAB — ALDOSTERONE + RENIN ACTIVITY W/ RATIO
ALDOS/RENIN RATIO: >48.5 — ABNORMAL HIGH (ref 0.0–30.0)
ALDOSTERONE: 8.1 ng/dL (ref 0.0–30.0)
Renin: <0.167 ng/mL/hr — ABNORMAL LOW (ref 0.167–5.380)

## 2021-04-16 ENCOUNTER — Ambulatory Visit: Payer: Medicare Other

## 2021-04-23 ENCOUNTER — Ambulatory Visit: Payer: Medicare Other

## 2021-05-19 ENCOUNTER — Ambulatory Visit (INDEPENDENT_AMBULATORY_CARE_PROVIDER_SITE_OTHER): Payer: Medicare Other | Admitting: Pharmacist Clinician (PhC)/ Clinical Pharmacy Specialist

## 2021-05-19 ENCOUNTER — Other Ambulatory Visit: Payer: Self-pay

## 2021-05-19 VITALS — BP 154/92 | HR 67 | Resp 16 | Ht 61.0 in | Wt 162.2 lb

## 2021-05-19 DIAGNOSIS — I10 Essential (primary) hypertension: Secondary | ICD-10-CM

## 2021-05-19 MED ORDER — AMLODIPINE BESYLATE 2.5 MG PO TABS: 2.5000 mg | ORAL_TABLET | Freq: Every day | ORAL | 3 refills | Status: DC

## 2021-05-19 MED ORDER — VALSARTAN 160 MG PO TABS: 160.0000 mg | ORAL_TABLET | Freq: Every day | ORAL | 6 refills | Status: DC

## 2021-05-19 MED ORDER — CARVEDILOL 12.5 MG PO TABS: 12.5000 mg | ORAL_TABLET | Freq: Two times a day (BID) | ORAL | 3 refills | Status: DC

## 2021-05-19 MED ORDER — SODIUM CHLORIDE 1 G PO TABS: 2.0000 g | ORAL_TABLET | Freq: Three times a day (TID) | ORAL | 0 refills | Status: DC

## 2021-05-19 NOTE — Progress Notes (Signed)
05/19/2021 Katie Potts [birth year] [id number]   HPI:  Katie Potts is a 70 y.o. female patient of Dr Oval Linsey, with a [employer] below who presents today for advanced hypertension clinic follow up.  When she saw Dr. Oval Linsey in March her pressure was at 130/88 and carvedilol was added to her amlodipine.  She was given information on sodium restriction and exercise, and showed interest in the PREP program.  She notes that her pressure has been elevated for more than 20 years and has never been well controlled.    Today she is in the office for follow up.  She has noted that the lower extremity edema has come back and is often bothersome.  Has not had any issues with starting the carvedilol.  She is excited to be celebrating her 70th birthday later this week, as she notes that very few family members have lived this long.    Past Medical History: ASCVD CC score 21 (80th percentile), prior TIA  hyperlipidemia LDL at 124 (10/21) on exetimibe  Diastolic HF chronic           Blood Pressure Goal:  130/80  Current Medications: amlodipine 5 mg qd, carvedilol 6.25 mg bid   Family Hx: mother died at 7 cancer, dad at 10 storke, both with hypertension, 4 siblings, brothe died form MI at 46, others all with hypertension, 1 son, now deceased,   Social Hx: no tobacco, occasional alcohol, drinks occasional decaf coffee  Diet: mostly home cooked meals, adds cayenne pepper rather than salt; mostly chicken and fish for protein, lots of vegetables daily (frozen); snacks on nuts, pudding cups  Exercise: stationary bike 2 days per week, 15-30 minutes  Home BP readings: forgot readings at home; systolic mostly 240'X -735'H with occasional up to 299; diastolic between 24-268  Intolerances: amlodipine -headaches in the past, now tolerating; losartan and olmesartan didn't work, hctz may have caused hypokalemia  Labs: 4/22:  Na 141, K 4.0, Glu 94, BUN 12, SCr 0.95, GFR 66   Wt Readings from Last 3  Encounters:  05/19/21 162 lb 3.2 oz (73.6 kg)  03/16/21 163 lb 6.4 oz (74.1 kg)  04/02/20 163 lb 12.8 oz (74.3 kg)   BP Readings from Last 3 Encounters:  05/19/21 (!) 154/92  03/25/21 135/71  03/16/21 130/88   Pulse Readings from Last 3 Encounters:  05/19/21 67  03/25/21 67  03/16/21 76    Current Outpatient Medications  Medication Sig Dispense Refill  . amLODipine (NORVASC) 2.5 MG tablet Take 1 tablet (2.5 mg total) by mouth daily. 90 tablet 3  . aspirin EC 81 MG tablet Take 81 mg by mouth daily.    . carvedilol (COREG) 12.5 MG tablet Take 1 tablet (12.5 mg total) by mouth 2 (two) times daily. 180 tablet 3  . clonazePAM (KLONOPIN) 1 MG tablet Take 1 mg by mouth 2 (two) times daily as needed for anxiety.    . diphenhydrAMINE HCl (BENADRYL ALLERGY PO) Take 2 tablets by mouth as needed.    . ezetimibe (ZETIA) 10 MG tablet Take 1 tablet (10 mg total) by mouth daily. 90 tablet 3  . FLUoxetine (PROZAC) 10 MG capsule Take 1 capsule by mouth daily.    Marland Kitchen ibuprofen (ADVIL) 800 MG tablet Take 800 mg by mouth 3 (three) times daily.    . sodium chloride 1 g tablet Take 2 tablets (2 g total) by mouth 3 (three) times daily with meals. 18 tablet 0  . valsartan (DIOVAN) 160 MG  tablet Take 1 tablet (160 mg total) by mouth daily. 30 tablet 6  . zolpidem (AMBIEN) 10 MG tablet Take 10 mg by mouth at bedtime as needed for sleep.     No current facility-administered medications for this visit.    Allergies  Allergen Reactions  . Losartan     myalgias  . Pravastatin     myalgias  . Rosuvastatin     myalgias  . Seroquel [Quetiapine Fumarate] Swelling    Past Medical History:  Diagnosis Date  . Asthma   . Chest pain of uncertain etiology 6/94/8546  . Depression    MAJOR DEPRESSION, DR. KAUR;PSYCHIATRIST  . HTN (hypertension) 07/06/2019  . Hyperlipidemia   . Hypokalemia 03/16/2021  . Snoring 03/16/2021    Blood pressure (!) 154/92, pulse 67, resp. rate 16, height 5\' 1"  (1.549 m), weight  162 lb 3.2 oz (73.6 kg), SpO2 95 %.  HTN (hypertension) Patient with hypertension, still not well controlled.  Her hyperaldosteronism labs came back with elevated renin and ratio, but aldosterone normal.  Reviewed with Dr. Oval Linsey and we will have patient complete a sodium load followed by 24 hour urine collection. When she brings the sample back we will repeat metabolic panel.  Once she has done all that, will have her start valsartan 160 mg once daily in the mornings and cut the amlodipine to 2.5 mg.  She will also increase the carvedilol from 6.25 to 12.5 mg twice daily.  We will see her back in the office in 1 month for follow up.     Tommy Medal PharmD CPP [facility] [address] [place], [place] [address]

## 2021-05-19 NOTE — Patient Instructions (Signed)
Return for a a follow up appointment June 30 at 12:30  Take sodium 2 gm three times daily Monday June 6-Wednesday June 8.  On Wednesday morning you will start a 24 hour urine collection.   On Thursday June 9 come to the office first thing in the morning to return the urine sample and have blood work drawn.     Check your blood pressure at home daily (if able) and keep record of the readings.  Take your BP meds as follows:  On Monday (June 6) increase carvedilol to 12.5 mg twice daily  On Thursday start valsartan 160 mg once daily.    Ont Thursday cut amlodipine down to 2.5 mg (1/2 tablet) daily  Bring all of your meds, your BP cuff and your record of home blood pressures to your next appointment.  Exercise as you're able, try to walk approximately 30 minutes per day.  Keep salt intake to a minimum, especially watch canned and prepared boxed foods.  Eat more fresh fruits and vegetables and fewer canned items.  Avoid eating in fast food restaurants.    HOW TO TAKE YOUR BLOOD PRESSURE: . Rest 5 minutes before taking your blood pressure. .  Don't smoke or drink caffeinated beverages for at least 30 minutes before. . Take your blood pressure before (not after) you eat. . Sit comfortably with your back supported and both feet on the floor (don't cross your legs). . Elevate your arm to heart level on a table or a desk. . Use the proper sized cuff. It should fit smoothly and snugly around your bare upper arm. There should be enough room to slip a fingertip under the cuff. The bottom edge of the cuff should be 1 inch above the crease of the elbow. . Ideally, take 3 measurements at one sitting and record the average.

## 2021-05-19 NOTE — Assessment & Plan Note (Signed)
Patient with hypertension, still not well controlled.  Her hyperaldosteronism labs came back with elevated renin and ratio, but aldosterone normal.  Reviewed with Dr. Oval Linsey and we will have patient complete a sodium load followed by 24 hour urine collection. When she brings the sample back we will repeat metabolic panel.  Once she has done all that, will have her start valsartan 160 mg once daily in the mornings and cut the amlodipine to 2.5 mg.  She will also increase the carvedilol from 6.25 to 12.5 mg twice daily.  We will see her back in the office in 1 month for follow up.

## 2021-06-01 ENCOUNTER — Telehealth: Payer: Self-pay

## 2021-06-01 NOTE — Telephone Encounter (Signed)
Called and spoke w/pt who stated that they will complete labs asap

## 2021-06-04 DIAGNOSIS — I10 Essential (primary) hypertension: Secondary | ICD-10-CM | POA: Diagnosis not present

## 2021-06-05 LAB — BASIC METABOLIC PANEL
BUN/Creatinine Ratio: 14 (ref 12–28)
BUN: 13 mg/dL (ref 8–27)
CO2: 24 mmol/L (ref 20–29)
Calcium: 9.4 mg/dL (ref 8.7–10.3)
Chloride: 106 mmol/L (ref 96–106)
Creatinine, Ser: 0.93 mg/dL (ref 0.57–1.00)
Glucose: 86 mg/dL (ref 65–99)
Potassium: 3.8 mmol/L (ref 3.5–5.2)
Sodium: 143 mmol/L (ref 134–144)
eGFR: 67 mL/min/{1.73_m2} (ref >59)

## 2021-06-05 LAB — CREATININE CLEARANCE, URINE, 24 HOUR
Creatinine Clearance: 76 mL/min — ABNORMAL LOW (ref 88–128)
Creatinine, 24H Ur: 1186 mg/24 hr (ref 800–1800)
Creatinine, Ser: 1.09 mg/dL — ABNORMAL HIGH (ref 0.57–1.00)
Creatinine, Urine: 65.9 mg/dL
eGFR: 55 mL/min/{1.73_m2} — ABNORMAL LOW (ref >59)

## 2021-06-05 LAB — SODIUM, URINE, 24 HOUR
Sodium, 24H Ur: 171 mmol/24 hr (ref 39–258)
Sodium, Ur: 95 mmol/L

## 2021-06-18 ENCOUNTER — Ambulatory Visit: Payer: Medicare Other

## 2021-06-24 ENCOUNTER — Telehealth: Payer: Self-pay | Admitting: *Deleted

## 2021-06-24 ENCOUNTER — Ambulatory Visit (INDEPENDENT_AMBULATORY_CARE_PROVIDER_SITE_OTHER): Payer: Medicare Other | Admitting: Pharmacist Clinician (PhC)/ Clinical Pharmacy Specialist

## 2021-06-24 ENCOUNTER — Other Ambulatory Visit: Payer: Self-pay

## 2021-06-24 DIAGNOSIS — I10 Essential (primary) hypertension: Secondary | ICD-10-CM | POA: Diagnosis not present

## 2021-06-24 NOTE — Progress Notes (Signed)
06/25/2021 Katie Potts 09-12-1951 [id number]   HPI:  Katie Potts is a 70 y.o. female patient of Dr Oval Linsey, with a [place] below who presents today for advanced hypertension clinic follow up.  When she saw Dr. Oval Linsey in March her pressure was at 130/88 and carvedilol was added to her amlodipine.  She was given information on sodium restriction and exercise, and showed interest in the PREP program.  She notes that her pressure has been elevated for more than 20 years and has never been well controlled.  At her last visit she noted lower extremity edema and her pressure remained elevated at 154/92.  Aldosterone labs come back with elevated renin and ratio, but normal aldosterone.  We ordered a 24 hour urine sodium and aldosterone.  Carvedilol was increased to 12.5 mg twice daily and valsartan 160 mg daily was added.    Today she is in the office for follow up.  States that since the medication changes at her last appointment she has noticed a squeezing pain in her chest that starts about 2 hours after the morning medications.  It continues on and off throughout the day, but is usually gone in the evenings.  Recently she did a 24 hour urine collection to check for hyperaldosteronism, however part of the test was not completed, so we will need to re-order that.    Past Medical History: ASCVD CC score 6 (80th percentile), prior TIA  hyperlipidemia LDL at 124 (10/21) on exetimibe  Diastolic HF chronic     Blood Pressure Goal:  130/80  Current Medications: amlodipine 5 mg qd, carvedilol 12.5 mg bid, valsartan 160 mg qd  Family Hx: mother died at 85 cancer, dad at 54 storke, both with hypertension, 4 siblings, brother died form MI at 32, others all with hypertension, 1 son, now deceased,   Social Hx: no tobacco, occasional alcohol, drinks occasional decaf coffee  Diet: mostly home cooked meals, adds cayenne pepper rather than salt; mostly chicken and fish for protein, lots of vegetables  daily (frozen); snacks on nuts, pudding cups  Exercise: stationary bike 2 days per week, 15-30 minutes  Home BP readings: last 10 days  AM average 137/69   PM average 140/71  Intolerances: amlodipine -headaches in the past, now tolerating; losartan and olmesartan didn't work, hctz may have caused hypokalemia  Labs: 4/22:  Na 141, K 4.0, Glu 94, BUN 12, SCr 0.95, GFR 66   Wt Readings from Last 3 Encounters:  06/24/21 161 lb 3.2 oz (73.1 kg)  05/19/21 162 lb 3.2 oz (73.6 kg)  03/16/21 163 lb 6.4 oz (74.1 kg)   BP Readings from Last 3 Encounters:  06/24/21 (!) 160/88  05/19/21 (!) 154/92  03/25/21 135/71   Pulse Readings from Last 3 Encounters:  06/24/21 (!) 56  05/19/21 67  03/25/21 67    Current Outpatient Medications  Medication Sig Dispense Refill   amLODipine (NORVASC) 2.5 MG tablet Take 1 tablet (2.5 mg total) by mouth daily. 90 tablet 3   aspirin EC 81 MG tablet Take 81 mg by mouth daily.     carvedilol (COREG) 12.5 MG tablet Take 1 tablet (12.5 mg total) by mouth 2 (two) times daily. 180 tablet 3   clonazePAM (KLONOPIN) 1 MG tablet Take 1 mg by mouth 2 (two) times daily as needed for anxiety.     diphenhydrAMINE HCl (BENADRYL ALLERGY PO) Take 2 tablets by mouth as needed.     ezetimibe (ZETIA) 10 MG tablet Take 1  tablet (10 mg total) by mouth daily. 90 tablet 3   valsartan (DIOVAN) 160 MG tablet Take 1 tablet (160 mg total) by mouth daily. 30 tablet 6   zolpidem (AMBIEN) 10 MG tablet Take 10 mg by mouth at bedtime as needed for sleep.     No current facility-administered medications for this visit.    Allergies  Allergen Reactions   Losartan     myalgias   Pravastatin     myalgias   Rosuvastatin     myalgias   Seroquel [Quetiapine Fumarate] Swelling    Past Medical History:  Diagnosis Date   Asthma    Chest pain of uncertain etiology 3/58/2518   Depression    MAJOR DEPRESSION, DR. KAUR;PSYCHIATRIST   HTN (hypertension) 07/06/2019   Hyperlipidemia     Hypokalemia 03/16/2021   Snoring 03/16/2021    Blood pressure (!) 160/88, pulse (!) 56, height 5\' 1"  (1.549 m), weight 161 lb 3.2 oz (73.1 kg).  HTN (hypertension) Patient with essential hypertension, still not to blood pressure goals.  Unfortunately she has been having some chest discomfort that she associates with the medications.  Will have her cut the carvedilol back to 6.25 mg twice daily for the next week. If the symptoms go away, she will continue at that dose.  Should they not be resolved, she should then hold valsartan.  To keep her pressure from increasing too much, will have her increase amlodipine to 10 mg for the days she is not on valsartan. (10 mg dose causes some LEE, so this is a temporary fix).  She will need to call back in 10-14 days to let us know how she is doing with these changes and how her symptoms are.     Tommy Medal PharmD CPP [place] [address] [place], [place] [address]

## 2021-06-24 NOTE — Telephone Encounter (Signed)
   [facility] Pre-operative Risk Assessment    Patient Name: Katie Potts  DOB: 07/26/1951  MRN: [id number]    Request for surgical clearance:  What type of surgery is being performed? colonoscopy   When is this surgery scheduled? 06/30/21   What type of clearance is required (medical clearance vs. Pharmacy clearance to hold med vs. Both)? both  Are there any medications that need to be held prior to surgery and how long? ASA   Practice name and name of physician performing surgery? [facility] Dr. Watt Climes   What is the office phone number? [phone]   7.   What is the office fax number? [phone]  8.   Anesthesia type (None, local, MAC, general) ?    [place] 06/24/2021, 2:58 PM  _________________________________________________________________   (provider comments below)

## 2021-06-24 NOTE — Telephone Encounter (Signed)
Pt saw pharmD today, OV not yet complete. Await OV note then will review.

## 2021-06-24 NOTE — Patient Instructions (Signed)
  Check your blood pressure at home daily and keep record of the readings.  Take your BP meds as follows:  Cut carvedilol back to 6.25 mg (1/2 tablet) twice daily.  Next Wednesday (July 13) stop valsartan if the chest pressure is still present.  Double amlodipine to 5 mg while off valsartan.   Call me on Monday July 18 to let me know how you are doing (Romeka Scifres at [phone])   Bring all of your meds, your BP cuff and your record of home blood pressures to your next appointment.  Exercise as you're able, try to walk approximately 30 minutes per day.  Keep salt intake to a minimum, especially watch canned and prepared boxed foods.  Eat more fresh fruits and vegetables and fewer canned items.  Avoid eating in fast food restaurants.    HOW TO TAKE YOUR BLOOD PRESSURE: Rest 5 minutes before taking your blood pressure.  Don't smoke or drink caffeinated beverages for at least 30 minutes before. Take your blood pressure before (not after) you eat. Sit comfortably with your back supported and both feet on the floor (don't cross your legs). Elevate your arm to heart level on a table or a desk. Use the proper sized cuff. It should fit smoothly and snugly around your bare upper arm. There should be enough room to slip a fingertip under the cuff. The bottom edge of the cuff should be 1 inch above the crease of the elbow. Ideally, take 3 measurements at one sitting and record the average.

## 2021-06-25 ENCOUNTER — Encounter: Payer: Self-pay | Admitting: Pharmacist Clinician (PhC)/ Clinical Pharmacy Specialist

## 2021-06-25 NOTE — Telephone Encounter (Signed)
Awaiting pharmD note for update. Will route to callback letting them know pt recently seen for uncontrolled high BP, still in the process of management - please find out urgency of colonoscopy and how long ASA needs to be held as we may need to ensure this is under better control before clearing. Yesterday's AVS mentions chest pressure so sent message to pharmD to clarify, awaiting, message back.

## 2021-06-25 NOTE — Telephone Encounter (Signed)
S/w Barb (surgery scheduler) for Dr. Watt Climes. In my conversation with Barb, I did state that the pt was seen for uncontrolled high blood pressure, as well as there was some mention of chest pressure. Please see note from today from pre op provider:  Charlie Pitter, PA-C 7 minutes ago (11:17 AM)   Awaiting pharmD note for update. Will route to callback letting them know pt recently seen for uncontrolled high BP, still in the process of management - please find out urgency of colonoscopy and how long ASA needs to be held as we may need to ensure this is under better control before clearing. Yesterday's AVS mentions chest pressure so sent message to pharmD to clarify, awaiting, message back.     Per Barb, colonoscopy is not of urgent nature. She did state the pt has a family history of colon cancer. Barb, agreed that if there are some cardiac issues that need to be resolved first they can push out colonoscopy. I assured Katie Potts that I will the pre op provider know and we will send notes over.

## 2021-06-25 NOTE — Assessment & Plan Note (Signed)
Patient with essential hypertension, still not to blood pressure goals.  Unfortunately she has been having some chest discomfort that she associates with the medications.  Will have her cut the carvedilol back to 6.25 mg twice daily for the next week. If the symptoms go away, she will continue at that dose.  Should they not be resolved, she should then hold valsartan.  To keep her pressure from increasing too much, will have her increase amlodipine to 10 mg for the days she is not on valsartan. (10 mg dose causes some LEE, so this is a temporary fix).  She will need to call back in 10-14 days to let us know how she is doing with these changes and how her symptoms are.

## 2021-06-26 NOTE — Telephone Encounter (Signed)
   Name: Katie Potts  DOB: 28-Jan-1951  MRN: [id number]  Primary Cardiologist: Sinclair Grooms, MD - Also recently followed in HTN clinic by Dr. Oval Linsey   Chart reviewed as part of pre-operative protocol coverage. Because of [name]'s past medical history and time since last visit, she will require a follow-up visit in order to better assess preoperative cardiovascular risk. She has recently been evaluated in the HTN clinic for uncontrolled high BP. She has required several different medication adjustments. Per last OV 06/24/21 she also had noticed some intermittent chest pressure with her medications, prompting further med adjustment. Coronary CTA 03/2021 was generally reassuring with suspected mild nonobstructive CAD, but patient cannot be cleared over our preop phone protocol given new/changed symptoms from last OV. Since colonoscopy is not urgent, would suggest OV to reassess BP and symptoms prior to clearing.  Pre-op covering staff: - Please schedule appointment and call patient to inform them.  - Please contact requesting surgeon's office via preferred method (i.e, phone, fax) to inform them of need for appointment prior to surgery.  Would not provide ASA rec until seen in clinic given chest discomfort symptoms. She has no history of MI/PCI so should be OK to hold from cardiac standpoint but limit time off given hx of TIA per hx.  Charlie Pitter, PA-C  06/26/2021, 1:06 PM

## 2021-06-26 NOTE — Telephone Encounter (Signed)
I s/w the pt and explained to her with the symptoms she has stated in her recent ov as well as uncontrolled BP that she will need an appt before she can be cleared. Pt is agreeable to plan of care. I scheduled the pt to see Laurann Montana, NP at [facility] location same day Dr. Oval Linsey is in the office there as well. I feel this will be a good way to assess the pt for pre op, her chest pressure as well as the uncontrolled BP. Pt is agreeable to appt for 07/17/21 @ 8 am with Laurann Montana, NP. I will forward notes to NP for upcoming appt. I will send FYI to requesting office pt has appt 07/17/21.

## 2021-07-06 ENCOUNTER — Telehealth: Payer: Self-pay | Admitting: Pharmacist Clinician (PhC)/ Clinical Pharmacy Specialist

## 2021-07-06 NOTE — Telephone Encounter (Signed)
At last hypertension visit patient was reporting squeezing pains in her chest, which had started after some medication changes.  She was advised to cut carvedilol back to 6.25 mg and if that didn't stop the pains, try stopping the valsartan. (And if d/c valsartan, increase amlodipine to 10 mg) Today she called to report that the chest pains stopped after discontinuing the valsartan.  She did increase the amlodipine and has not noticed any lower extremity edema.  She also notes that her BP readings are doing much better.  In the past week only 1 AM reading at 149/71, all other readings were 670-141 systolic.  She is feeling much better.  Will have her continue with carvedilol 6.25 mg and amlodipine 10 mg.  She has a visit with Laurann Montana at [place] next week and we can continue to follow after that as needed.

## 2021-07-17 ENCOUNTER — Ambulatory Visit (HOSPITAL_BASED_OUTPATIENT_CLINIC_OR_DEPARTMENT_OTHER): Payer: Medicare Other | Admitting: Family

## 2021-07-30 ENCOUNTER — Other Ambulatory Visit: Payer: Self-pay | Admitting: *Deleted

## 2021-07-30 MED ORDER — EZETIMIBE 10 MG PO TABS: 10.0000 mg | ORAL_TABLET | Freq: Every day | ORAL | 0 refills | Status: DC

## 2021-08-19 ENCOUNTER — Ambulatory Visit (HOSPITAL_BASED_OUTPATIENT_CLINIC_OR_DEPARTMENT_OTHER): Payer: Medicare Other | Admitting: Family

## 2021-08-19 ENCOUNTER — Telehealth (HOSPITAL_BASED_OUTPATIENT_CLINIC_OR_DEPARTMENT_OTHER): Payer: Self-pay | Admitting: Family

## 2021-08-19 ENCOUNTER — Encounter (HOSPITAL_BASED_OUTPATIENT_CLINIC_OR_DEPARTMENT_OTHER): Payer: Self-pay | Admitting: Family

## 2021-08-19 ENCOUNTER — Other Ambulatory Visit: Payer: Self-pay

## 2021-08-19 VITALS — BP 122/84 | HR 67 | Ht 61.0 in | Wt 164.3 lb

## 2021-08-19 DIAGNOSIS — Z01810 Encounter for preprocedural cardiovascular examination: Secondary | ICD-10-CM

## 2021-08-19 DIAGNOSIS — I10 Essential (primary) hypertension: Secondary | ICD-10-CM | POA: Diagnosis not present

## 2021-08-19 DIAGNOSIS — F4321 Adjustment disorder with depressed mood: Secondary | ICD-10-CM

## 2021-08-19 DIAGNOSIS — K5900 Constipation, unspecified: Secondary | ICD-10-CM | POA: Diagnosis not present

## 2021-08-19 DIAGNOSIS — E663 Overweight: Secondary | ICD-10-CM

## 2021-08-19 MED ORDER — CARVEDILOL 6.25 MG PO TABS: 6.2500 mg | ORAL_TABLET | Freq: Two times a day (BID) | ORAL | 6 refills | Status: DC

## 2021-08-19 NOTE — Telephone Encounter (Signed)
Follow up:    Patient returning a call back to the nurse.

## 2021-08-19 NOTE — Telephone Encounter (Signed)
Left message for pt--ok per DPR. Informed pt that we had reviewed a telephone note from July concerning Valsartan and Amlodipine and wanted to clarify some things. Asked pt to call back as soon as she can.  Reviewed medications with pt during office visit and pt stated she had stopped Carvedilol, but was taking other medications listed. Currently, Valsartan is still on med list and Amlodipine 2.5 is listed. Does not look like Amlodipine was increased to 10 mg as stated in last telephone note.  Waiting for pt to return call. Will try to reach out again before the afternoon if she does not call abck.

## 2021-08-19 NOTE — Patient Instructions (Addendum)
Medication Instructions:  Continue your current medications.   *If you need a refill on your cardiac medications before your next appointment, please call your pharmacy*  Lab Work: None ordered today.   Testing/Procedures: Your EKG today showed normal sinus rhythm and looked great!   Follow-Up: At [facility], you and your health needs are our priority.  As part of our continuing mission to provide you with exceptional heart care, we have created designated Provider Care Teams.  These Care Teams include your primary Cardiologist (physician) and Advanced Practice Providers (APPs -  Physician Assistants and Nurse Practitioners) who all work together to provide you with the care you need, when you need it.  We recommend signing up for the patient portal called "MyChart".  Sign up information is provided on this After Visit Summary.  MyChart is used to connect with patients for Virtual Visits (Telemedicine).  Patients are able to view lab/test results, encounter notes, upcoming appointments, etc.  Non-urgent messages can be sent to your provider as well.   To learn more about what you can do with MyChart, go to [email or web address].    Your next appointment:   As scheduled  Provider:   Skeet Latch, MD   Other Instructions  You are cleared for colonoscopy. We will send a note to Dr. Perley Jain office. Recommend contacting his office regarding constipation and to find out how to hold your Aspirin prior to colonoscopy.   You have been referred to the [place] program for exercise and dietary teaching.   You have been referred to psychology today. Mental health is an important part of heart health. You have been referred to Dr. Elias Else. He is located at [facility] at [facility]. They will contact you to schedule an appointment. If you have not heard from them in 1 week, you may call [phone] to schedule an appointment.   If you feel you are ready to  schedule sleep study, please contact our office. This can help your blood pressure to get to even better goal!

## 2021-08-19 NOTE — Telephone Encounter (Signed)
Returned call to pt. She stated she got home and looked at her medications and realized she told us she stopped the wrong med. She said it is the valsartan that she stopped. She said she was told to double her amlodipine (was taking a 2.5 mg tablet, now taking 5 mg tablet daily). She stated that she is taking Carvedilol 6.25 mg BID.   Updated med list in pt chart. Asked pt if she would rather have a script for 6.25 mg Carvedilol tablets so she would not have to cut in half. Pt was agreeable and asked for 30 day Rx. Pt stated she has 5 mg Amlodipine tablets and did not need any of those. Sent Carvedilol 6.25 mg tablets to pt pharmacy. Pt verbalized thanks for the call.

## 2021-08-19 NOTE — Progress Notes (Signed)
Office Visit    Patient Name: Katie Potts Date of Encounter: 08/19/2021  PCP:  Maurice Small, MD   [place]  Cardiologist:  Sinclair Grooms, MD  Advanced Practice Provider:  No care team member to display Electrophysiologist:  None      Chief Complaint    Katie Potts is a 70 y.o. female with a hx of coronary calcification, hypertension, hyperlipidemia,TIA, hypokalemia, chronic diastolic heart failure, and palpitations    presents today for preoperative cardiovascular clearance  Past Medical History    Past Medical History:  Diagnosis Date   Asthma    Chest pain of uncertain etiology 0000000   Depression    MAJOR DEPRESSION, DR. KAUR;PSYCHIATRIST   HTN (hypertension) 07/06/2019   Hyperlipidemia    Hypokalemia 03/16/2021   Snoring 03/16/2021   Past Surgical History:  Procedure Laterality Date   BREAST BIOPSY     BREAST EXCISIONAL BIOPSY     BREAST SURGERY     REDUCTION MAMMAPLASTY Bilateral    30 years ago    Allergies  Allergies  Allergen Reactions   Losartan     myalgias   Pravastatin     myalgias   Rosuvastatin     myalgias   Seroquel [Quetiapine Fumarate] Swelling    History of Present Illness    Katie Potts is a 70 y.o. female with a hx of coronary calcification, hypertension, hyperlipidemia,TIA, hypokalemia, chronic diastolic heart failure, and palpitations    last seen 06/24/2021 by pharmacy team.  She was initially diagnosed with hypertension in the 1990s around the time her son was murdered.  Since that time her blood pressure has been labile.  She saw Dr. Tamala Julian 03/2020 for palpitations with monitoring showing no significant arrhythmia.  Coronary calcium score 03/2020 of 54 which was 79th percentile for age and gender.  She was referred to Dr. Oval Linsey for uncontrolled hypertension.  At most recent visit with cardiology pharmacist due to reports of squeezing pains in her chest after medication changes she was  recommended to reduce carvedilol to 6.25 mg.  When that did not resolve symptoms losartan was discontinued and amlodipine increased to 10 mg.  She was contacted 07/06/2021 and noted her chest pain had resolved and her blood pressure is well controlled  She presents today for cardiovascular clearance for colonoscopy.  No recurrent chest pain, pressure, tightness.Tells me she has not moved her bowel in one week. Notes nausea. Previously used magnesium citrate but this is unavailable.  She has tried Metamucil and MiraLAX without success.  Notes Dulcolax and similar agents gave her extreme cramping.  She has tried suppository without success.  Encouraged to call her gastroenterologist for follow-up.  Her blood pressure at home has been well controlled. She has been working to follow a heart healthy diet. She has an exercise bike that she is using 15-30 minutes 2 times per week.   Tells me she had two friends pass away recently.  Has been very stressful.  She previously saw Dr. Robina Ade of Psyciatry but was provided and became out of network and she has been not able to follow-up with them.  She is interested in referral to psychology.   EKGs/Labs/Other Studies Reviewed:   The following studies were reviewed today:  EKG:  EKG is  ordered today.  The ekg ordered today demonstrates NSR 67 bpm with no acute ST/T wave changes.  Recent Labs: 06/04/2021: BUN 13; Creatinine, Ser 0.93; Potassium 3.8; Sodium 143  Recent Lipid Panel  Component Value Date/Time   CHOL 174 07/07/2020 0834   TRIG 98 07/07/2020 0834   HDL 63 07/07/2020 0834   CHOLHDL 2.8 07/07/2020 0834   LDLCALC 93 07/07/2020 0834   Home Medications   Current Meds  Medication Sig   aspirin EC 81 MG tablet Take 81 mg by mouth daily.   clonazePAM (KLONOPIN) 1 MG tablet Take 1 mg by mouth 2 (two) times daily as needed for anxiety.   diphenhydrAMINE HCl (BENADRYL ALLERGY PO) Take 2 tablets by mouth as needed.   ezetimibe (ZETIA) 10 MG tablet  Take 1 tablet (10 mg total) by mouth daily.   valsartan (DIOVAN) 160 MG tablet Take 1 tablet (160 mg total) by mouth daily.   zolpidem (AMBIEN) 10 MG tablet Take 10 mg by mouth at bedtime as needed for sleep.     Review of Systems      All other systems reviewed and are otherwise negative except as noted above.  Physical Exam    VS:  BP 122/84   Pulse 67   Ht '5\' 1"'$  (1.549 m)   Wt 164 lb 4.8 oz (74.5 kg)   BMI 31.04 kg/m  , BMI Body mass index is 31.04 kg/m.  Wt Readings from Last 3 Encounters:  08/19/21 164 lb 4.8 oz (74.5 kg)  06/24/21 161 lb 3.2 oz (73.1 kg)  05/19/21 162 lb 3.2 oz (73.6 kg)     GEN: Well nourished, overweight, well developed, in no acute distress. HEENT: normal. Neck: Supple, no JVD, carotid bruits, or masses. Cardiac: RRR, no murmurs, rubs, or gallops. No clubbing, cyanosis, edema.  Radials/PT 2+ and equal bilaterally.  Respiratory:  Respirations regular and unlabored, clear to auscultation bilaterally. GI: Soft, nontender, nondistended. MS: No deformity or atrophy. Skin: Warm and dry, no rash. Neuro:  Strength and sensation are intact. Psych: Normal affect.  Assessment & Plan    Preoperative cardiovascular clearance - Upcoming colonoscopy. Exercise tolerance > 4 METS. No recurrent chest pain. EKG no acute ST/T wave changes.  She is deemed acceptable risk for the planned procedure without additional cardiovascular testing.  She may hold aspirin prior to the colonoscopy as directed by gastroenterology.  Will route to their office via epic fax function so they are aware.  HTN - BP well controlled. Continue current antihypertensive regimen.  Including carvedilol 6.25 mg twice daily, amlodipine 5 mg daily.  Previous intolerance to valsartan with chest pain. Referred to PREP program.  Snoring and daytime somnolence - Sleep study previously ordered. Has not yet completed. Recently loss two friends and understandably does not feel up to scheduling. She will  contact us when she is ready to schedule. Discussed the importance of treatment of potential OSA in setting of HTN.  Chronic diastolic heart failure - Euvolemic and well compensated on exam. Low salt diet encouraged. Continue carvedilol. No indication for diuretic.  CAD / HLD - Stable with no anginal symptoms. No indication for ischemic evaluation.  GDMT includes aspirin, zetia. Continue Zetia '10mg'$  daily.Intolerant to statin with myalgia.  Grief -Notes grief of the loss of 2 friends.  She is attending a funeral tomorrow.  Offered my condolences.  She is interested in counseling services and I have referred her to Dr. Elias Else, PsyD. Notes previously saw Dr. Robina Ade who is now out of network and unaffordable for her.  Obesity - Weight loss via diet and exercise encouraged. Discussed the impact being overweight would have on cardiovascular risk. Referred to PREP program for weight loss and  teaching.   Disposition: Follow up in 3 month(s) with Dr. Oval Linsey or APP and in 6 months with Dr. Tamala Julian  Signed, Loel Dubonnet, NP 08/19/2021, 10:00 AM [place]

## 2021-08-21 ENCOUNTER — Telehealth: Payer: Self-pay

## 2021-08-21 NOTE — Telephone Encounter (Signed)
Patient was seen by Laurann Montana 08/19/21. She needs an updated clearance sent to Dr. Watt Climes so she can schedule her procedure

## 2021-08-21 NOTE — Telephone Encounter (Signed)
   Primary Cardiologist: Sinclair Grooms, MD  Chart reviewed as part of pre-operative protocol coverage. Given past medical history and time since last visit, based on ACC/AHA guidelines, Aoki Shimmin would be at acceptable risk for the planned procedure without further cardiovascular testing.   Her aspirin may be held for 5 to 7 days prior to her procedure.  Please resume as soon as hemostasis is achieved.  She is able to achieve greater than 4 METS of physical activity.  I will route this recommendation to the requesting party via Epic fax function and remove from pre-op pool.  Please call with questions.  Jossie Ng. Koren Sermersheim NP-C    08/21/2021, 11:17 AM [place] [address] Office [phone] Fax [phone]

## 2021-08-21 NOTE — Telephone Encounter (Signed)
Called to discuss PREP program referral, she will return my call as she was on a call with another health care provider.

## 2021-09-29 ENCOUNTER — Other Ambulatory Visit: Payer: Self-pay | Admitting: Family Medicine

## 2021-09-29 DIAGNOSIS — Z1231 Encounter for screening mammogram for malignant neoplasm of breast: Secondary | ICD-10-CM

## 2021-10-20 DIAGNOSIS — I10 Essential (primary) hypertension: Secondary | ICD-10-CM | POA: Diagnosis not present

## 2021-10-20 DIAGNOSIS — R5382 Chronic fatigue, unspecified: Secondary | ICD-10-CM | POA: Diagnosis not present

## 2021-10-20 DIAGNOSIS — Z0000 Encounter for general adult medical examination without abnormal findings: Secondary | ICD-10-CM | POA: Diagnosis not present

## 2021-10-20 DIAGNOSIS — R739 Hyperglycemia, unspecified: Secondary | ICD-10-CM | POA: Diagnosis not present

## 2021-10-20 DIAGNOSIS — E785 Hyperlipidemia, unspecified: Secondary | ICD-10-CM | POA: Diagnosis not present

## 2021-10-20 DIAGNOSIS — Z23 Encounter for immunization: Secondary | ICD-10-CM | POA: Diagnosis not present

## 2021-10-29 ENCOUNTER — Other Ambulatory Visit: Payer: Self-pay | Admitting: Family Medicine

## 2021-10-29 DIAGNOSIS — E2839 Other primary ovarian failure: Secondary | ICD-10-CM

## 2021-11-02 DIAGNOSIS — Z8371 Family history of colonic polyps: Secondary | ICD-10-CM | POA: Diagnosis not present

## 2021-11-02 DIAGNOSIS — K649 Unspecified hemorrhoids: Secondary | ICD-10-CM | POA: Diagnosis not present

## 2021-11-02 DIAGNOSIS — D123 Benign neoplasm of transverse colon: Secondary | ICD-10-CM | POA: Diagnosis not present

## 2021-11-02 DIAGNOSIS — Z800 Family history of malignant neoplasm of digestive organs: Secondary | ICD-10-CM | POA: Diagnosis not present

## 2021-11-02 DIAGNOSIS — D124 Benign neoplasm of descending colon: Secondary | ICD-10-CM | POA: Diagnosis not present

## 2021-11-03 ENCOUNTER — Ambulatory Visit (HOSPITAL_BASED_OUTPATIENT_CLINIC_OR_DEPARTMENT_OTHER): Payer: Medicare Other | Admitting: Cardiovascular Disease

## 2021-11-04 ENCOUNTER — Other Ambulatory Visit: Payer: Self-pay

## 2021-11-04 ENCOUNTER — Ambulatory Visit
Admission: RE | Admit: 2021-11-04 | Discharge: 2021-11-04 | Disposition: A | Discharge status: Home / Self Care | Payer: Medicare Other | Source: Ambulatory Visit | Attending: Family Medicine | Admitting: Family Medicine

## 2021-11-04 DIAGNOSIS — Z1231 Encounter for screening mammogram for malignant neoplasm of breast: Secondary | ICD-10-CM | POA: Diagnosis not present

## 2021-11-04 DIAGNOSIS — D124 Benign neoplasm of descending colon: Secondary | ICD-10-CM | POA: Diagnosis not present

## 2021-11-04 DIAGNOSIS — D123 Benign neoplasm of transverse colon: Secondary | ICD-10-CM | POA: Diagnosis not present

## 2021-11-06 ENCOUNTER — Ambulatory Visit (HOSPITAL_BASED_OUTPATIENT_CLINIC_OR_DEPARTMENT_OTHER): Payer: Medicare Other | Admitting: Nurse Practitioner

## 2021-11-10 ENCOUNTER — Telehealth: Payer: Self-pay

## 2021-11-10 NOTE — Telephone Encounter (Signed)
Letter has been sent to patient instructing them to call us if they are still interested in completing their sleep study. If we have not received a response from the patient within 30 days of this notice, the order will be cancelled and they will need to discuss the need for a sleep study at their next office visit.  ° °

## 2021-11-26 ENCOUNTER — Other Ambulatory Visit: Payer: Medicare Other

## 2021-12-01 ENCOUNTER — Ambulatory Visit (HOSPITAL_BASED_OUTPATIENT_CLINIC_OR_DEPARTMENT_OTHER): Payer: Medicare Other | Admitting: Cardiovascular Disease

## 2021-12-03 ENCOUNTER — Other Ambulatory Visit: Payer: Medicare Other

## 2021-12-15 ENCOUNTER — Ambulatory Visit (HOSPITAL_BASED_OUTPATIENT_CLINIC_OR_DEPARTMENT_OTHER): Payer: Medicare Other | Admitting: Cardiovascular Disease

## 2021-12-15 NOTE — Progress Notes (Incomplete)
Advanced Hypertension Clinic Follow-up:    Date:  12/15/2021   ID:  Katie Potts, DOB [birth year], MRN [id number]  PCP:  Maurice Small, MD  Cardiologist:  Sinclair Grooms, MD  Nephrologist:  Referring MD: Maurice Small, MD   CC: Hypertension  History of Present Illness:    Katie Potts is a 70 y.o. female with a hx of coronary calcification, hypertension, hyperlipidemia,TIA, hypokalemia, chronic diastolic heart failure, and palpitations here for follow-up. She initially established care in the hypertension clinic 03/16/2021.  She was first diagnosed with hypertension in the 1990s around the time her son was murdered.  Since then her BP has been labile. She last saw Dr. Tamala Julian on 03/2020 with palpitations.  She wore an ambulatory monitor and experienced palpitations but no arrhythmias were noted on the monitor.  Blood pressures well-controlled at that time.  She had a coronary calcium score 03/2020 that was 54.  This was 68 percentile for age and gender.  Ms. Huguley has been working on her diet and limiting sweets.  She is trying to do this with her sister to keep her accountable.  She has also been working on portions.  She started exercising on Saturday by riding a bike for 30 minutes.  She had no exertional chest pain or shortness of breath.  She did have some chest pressure when laying down.  She does think that she snores and does not always feel well have been rested when she awakens in the morning.  This occurs intermittently.  She notes that when she first wakes up.  She is afraid because her brother had a heart attack recently at age 37.  Continues to have intermittently elevated blood pressures.  She notes that she gets a headache when her blood pressure is very high.  She continues to have palpitations where she feels like her heart is racing for several minutes at a time.  There is no associated lightheadedness or dizziness.  She has been cooking at home more and trying to limit her sodium  intake.  She drinks decaf coffee and does not use any supplements.  She sometimes has swelling in her hands but no orthopnea or PND.  Previous antihypertensives: Amlodipine-HA in the past.  Tolerates it now.  olmesartan- didn't work Losartan- didn't work HCTZ- ? hypokalemia  Past Medical History:  Diagnosis Date   Asthma    Chest pain of uncertain etiology 2/83/1517   Depression    MAJOR DEPRESSION, DR. KAUR;PSYCHIATRIST   HTN (hypertension) 07/06/2019   Hyperlipidemia    Hypokalemia 03/16/2021   Snoring 03/16/2021    Past Surgical History:  Procedure Laterality Date   BREAST BIOPSY     BREAST EXCISIONAL BIOPSY     BREAST SURGERY     REDUCTION MAMMAPLASTY Bilateral    30 years ago    Current Medications: No outpatient medications have been marked as taking for the 12/15/21 encounter (Appointment) with Skeet Latch, MD.     Allergies:   Losartan, Pravastatin, Rosuvastatin, and Seroquel [quetiapine fumarate]   Social History   Socioeconomic History   Marital status: Single    Spouse name: Not on file   Number of children: 1   Years of education: Not on file   Highest education level: Some college, no degree  Occupational History   Not on file  Tobacco Use   Smoking status: Never   Smokeless tobacco: Never  Vaping Use   Vaping Use: Never used  Substance and Sexual Activity  Alcohol use: No    Alcohol/week: 0.0 standard drinks   Drug use: No   Sexual activity: Not Currently  Other Topics Concern   Not on file  Social History Narrative   Not on file   Social Determinants of Health   Financial Resource Strain: Low Risk    Difficulty of Paying Living Expenses: Not hard at all  Food Insecurity: No Food Insecurity   Worried About Charity fundraiser in the Last Year: Never true   [place] in the Last Year: Never true  Transportation Needs: No Transportation Needs   Lack of Transportation (Medical): No   Lack of Transportation (Non-Medical): No   Physical Activity: Insufficiently Active   Days of Exercise per Week: 3 days   Minutes of Exercise per Session: 30 min  Stress: Stress Concern Present   Feeling of Stress : To some extent  Social Connections: Not on file     Family History: The patient's family history includes AAA (abdominal aortic aneurysm) in her father; Breast cancer in her cousin and sister; Cancer in her father and mother; Colon cancer in her paternal uncle; Deep vein thrombosis in her father; Diabetes in her father; Heart attack in her brother; Heart disease in her father; Hyperlipidemia in her father, mother, sister, and sister; Hypertension in her brother, father, mother, sister, and sister; Mental illness in her sister; Pancreatic cancer in her mother; Peripheral vascular disease in her father; Varicose Veins in her mother.  ROS:   Please see the history of present illness.   All other systems reviewed and are negative.  EKGs/Labs/Other Studies Reviewed:    EKG:  EKG is ordered today.  The ekg ordered today demonstrates sinus rhythm rate 76 bpm.    Recent Labs: 06/04/2021: BUN 13; Creatinine, Ser 0.93; Potassium 3.8; Sodium 143   Recent Lipid Panel    Component Value Date/Time   CHOL 174 07/07/2020 0834   TRIG 98 07/07/2020 0834   HDL 63 07/07/2020 0834   CHOLHDL 2.8 07/07/2020 0834   LDLCALC 93 07/07/2020 0834    Physical Exam:   VS:  There were no vitals taken for this visit. , BMI There is no height or weight on file to calculate BMI. GENERAL:  Well appearing HEENT: Pupils equal round and reactive, fundi not visualized, oral mucosa unremarkable NECK:  No jugular venous distention, waveform within normal limits, carotid upstroke brisk and symmetric, no bruits, no thyromegaly LYMPHATICS:  No cervical adenopathy LUNGS:  Clear to auscultation bilaterally HEART:  RRR.  PMI not displaced or sustained,S1 and S2 within normal limits, no S3, no S4, no clicks, no rubs, no murmurs ABD:  Flat, positive bowel  sounds normal in frequency in pitch, no bruits, no rebound, no guarding, no midline pulsatile mass, no hepatomegaly, no splenomegaly EXT:  2 plus pulses throughout, no edema, no cyanosis no clubbing SKIN:  No rashes no nodules NEURO:  Cranial nerves II through XII grossly intact, motor grossly intact throughout PSYCH:  Cognitively intact, oriented to person place and time   ASSESSMENT:    No diagnosis found.   PLAN:    # Essential hypertension: # Hypokalemia:  Continue amlodipine and add carvedilol to 6.25 mg twice daily.  This should help with both blood pressure and palpitations.  Check renin and aldosterone given her hypokalemia.  She is interested in enrolling in the PREP program through the [facility].  Recommend 150 minutes of exercise weekly and limiting sodium to 1500 mg daily.  She  was given a hypertension handbook and asked to check her blood pressures twice daily.  She will bring this to follow-up.  She will also get a sleep study for snoring and daytime somnolence.  Screening for Secondary Hypertension:  Causes 03/16/2021  Drugs/Herbals Screened  Renovascular HTN Not Screened  Sleep Apnea Screened     - Comments will get sleep study  Hyperthyroidism Screened  Thyroid Disease Screened  Hyperaldosteronism Screened     - Comments Check renin/aldo  Pheochromocytoma N/A  Cushing's Syndrome N/A  Hyperparathyroidism N/A  Coarctation of the Aorta Screened     - Comments BP symmetric  Compliance Screened    Relevant Labs/Studies: Basic Labs Latest Ref Rng & Units 06/04/2021 06/04/2021 03/24/2021  Sodium 134 - 144 mmol/L 143 - 141  Potassium 3.5 - 5.2 mmol/L 3.8 - 4.0  Creatinine 0.57 - 1.00 mg/dL 0.93 1.09(H) 0.95     #Coronary calcification: #Atypical chest pain: Ms. Hudon has chest pain upon awakening.  She has known coronary calcifications and her brother recently died of an MI at age 25.  We will get a coronary CT-A to better assess for obstructive coronary disease.  Need to  address lipids based on this data.   Disposition:    FU with MD/PharmD in 1 month    Medication Adjustments/Labs and Tests Ordered: Current medicines are reviewed at length with the patient today.  Concerns regarding medicines are outlined above.  No orders of the defined types were placed in this encounter.  No orders of the defined types were placed in this encounter.    Waynetta Pean  12/15/2021 7:50 AM    [facility]

## 2022-01-19 ENCOUNTER — Ambulatory Visit (HOSPITAL_BASED_OUTPATIENT_CLINIC_OR_DEPARTMENT_OTHER): Payer: Medicare Other | Admitting: Cardiovascular Disease

## 2022-01-19 DIAGNOSIS — Z79891 Long term (current) use of opiate analgesic: Secondary | ICD-10-CM | POA: Diagnosis not present

## 2022-01-28 ENCOUNTER — Telehealth: Payer: Self-pay | Admitting: Interventional Cardiology

## 2022-01-28 NOTE — Telephone Encounter (Signed)
Pt with palpitations for about a week now.  Feel similar to what she has had in the past but feel stronger and more frequent.  Notices fatigue, SOB and nausea with palps.  Denies CP, vision issues or dizziness/lightheadedness.  Denies caffeine use.  Did miss 3 doses of Coreg about 2 weeks ago but has been back on regular schedule since then.  Has not checked her vitals.  Previously wore a monitor that was normal.  Mentioned the possibility of a monitor and she states she can never get them to stay on.  They always start falling off 2-3 days after being applied.  Denies any issues currently.  Advised I will send message to Dr. Tamala Julian for review.

## 2022-01-28 NOTE — Telephone Encounter (Signed)
Patient c/o Palpitations:  High priority if patient c/o lightheadedness, shortness of breath, or chest pain  How long have you had palpitations/irregular HR/ Afib? Are you having the symptoms now?  Patient states she has been having palpitations on and off for the past week. No symptoms currently.  Are you currently experiencing lightheadedness, SOB or CP?  No   Do you have a history of afib (atrial fibrillation) or irregular heart rhythm?  No   Have you checked your BP or HR? (document readings if available):  No   Are you experiencing any other symptoms?  Fatigue

## 2022-02-01 NOTE — Telephone Encounter (Signed)
If continues, will need further eval with blood work and monitor. In short term get sleep, avoid caffeine, avoid ETOH, and call back after a period of watchful waiting.    The eval will be 2 week monitor, BMET and Magnesium level.  ----- Message -----  From: Loren Racer, RN  Sent: 01/28/2022   3:33 PM EST  To: Belva Crome, MD, Loren Racer, RN   Spoke with pt and made her aware of recommendations.  Pt agreeable to plan.  Appt with Richardson Dopp cancelled for tomorrow.

## 2022-02-02 ENCOUNTER — Ambulatory Visit: Payer: Medicare Other | Admitting: Physician Assistant

## 2022-04-05 ENCOUNTER — Other Ambulatory Visit: Payer: Self-pay

## 2022-04-06 MED ORDER — EZETIMIBE 10 MG PO TABS: 10.0000 mg | ORAL_TABLET | Freq: Every day | ORAL | 5 refills | Status: DC

## 2022-05-04 ENCOUNTER — Other Ambulatory Visit: Payer: Medicare Other

## 2022-05-05 ENCOUNTER — Ambulatory Visit: Payer: Medicare Other | Admitting: Interventional Cardiology

## 2022-06-14 ENCOUNTER — Ambulatory Visit: Payer: Medicare Other | Admitting: Interventional Cardiology

## 2022-07-16 ENCOUNTER — Ambulatory Visit: Payer: Medicare Other | Admitting: Interventional Cardiology

## 2022-08-17 DIAGNOSIS — L818 Other specified disorders of pigmentation: Secondary | ICD-10-CM | POA: Diagnosis not present

## 2022-08-17 DIAGNOSIS — L218 Other seborrheic dermatitis: Secondary | ICD-10-CM | POA: Diagnosis not present

## 2022-08-17 DIAGNOSIS — L089 Local infection of the skin and subcutaneous tissue, unspecified: Secondary | ICD-10-CM | POA: Diagnosis not present

## 2022-08-17 DIAGNOSIS — L821 Other seborrheic keratosis: Secondary | ICD-10-CM | POA: Diagnosis not present

## 2022-08-20 DIAGNOSIS — L80 Vitiligo: Secondary | ICD-10-CM | POA: Diagnosis not present

## 2022-08-29 NOTE — Progress Notes (Unsigned)
Cardiology Office Note:    Date:  08/30/2022   ID:  Katie Potts, DOB 03/02/1951, MRN [id number]  PCP:  Maurice Small, MD  Cardiologist:  Sinclair Grooms, MD   Referring MD: Maurice Small, MD   Chief Complaint  Patient presents with   Coronary Artery Disease   Hyperlipidemia   Hypertension    History of Present Illness:    Katie Potts is a 71 y.o. female with a hx of hyperlipidemia, coronary artery calcification, primary hypertension, chronic diastolic heart failure, snoring, TIA, and palpitations.     She feels well.  She has had prior TIA.  She has significant elevation in cholesterol.  Not sure she needs to have chronic cardiology follow-up.  She is active without any symptoms.  Palpitations have resolved.  Past Medical History:  Diagnosis Date   Asthma    Chest pain of uncertain etiology 06/09/3085   Depression    MAJOR DEPRESSION, DR. KAUR;PSYCHIATRIST   HTN (hypertension) 07/06/2019   Hyperlipidemia    Hypokalemia 03/16/2021   Snoring 03/16/2021    Past Surgical History:  Procedure Laterality Date   BREAST BIOPSY     BREAST EXCISIONAL BIOPSY     BREAST SURGERY     REDUCTION MAMMAPLASTY Bilateral    30 years ago    Current Medications: Current Meds  Medication Sig   amLODipine (NORVASC) 5 MG tablet Take 5 mg by mouth daily.   aspirin EC 81 MG tablet Take 81 mg by mouth daily.   clobetasol cream (TEMOVATE) 5.78 % Apply 1 Application topically 2 (two) times daily.   clonazePAM (KLONOPIN) 1 MG tablet Take 1 mg by mouth 2 (two) times daily as needed for anxiety.   diphenhydrAMINE HCl (BENADRYL ALLERGY PO) Take 2 tablets by mouth as needed.   ezetimibe (ZETIA) 10 MG tablet Take 1 tablet (10 mg total) by mouth daily.   FLUoxetine (PROZAC) 20 MG capsule Take 20 mg by mouth every morning.   fluticasone (CUTIVATE) 0.05 % cream SMARTSIG:Topical 1-2 Times Daily PRN   zolpidem (AMBIEN) 10 MG tablet Take 10 mg by mouth at bedtime as needed for sleep.     Allergies:    Losartan, Pravastatin, Rosuvastatin, and Seroquel [quetiapine fumarate]   Social History   Socioeconomic History   Marital status: Single    Spouse name: Not on file   Number of children: 1   Years of education: Not on file   Highest education level: Some college, no degree  Occupational History   Not on file  Tobacco Use   Smoking status: Never   Smokeless tobacco: Never  Vaping Use   Vaping Use: Never used  Substance and Sexual Activity   Alcohol use: No    Alcohol/week: 0.0 standard drinks of alcohol   Drug use: No   Sexual activity: Not Currently  Other Topics Concern   Not on file  Social History Narrative   Not on file   Social Determinants of Health   Financial Resource Strain: Low Risk  (03/16/2021)   Overall Financial Resource Strain (CARDIA)    Difficulty of Paying Living Expenses: Not hard at all  Food Insecurity: No Food Insecurity (03/16/2021)   Hunger Vital Sign    Worried About Running Out of Food in the Last Year: Never true    Ran Out of Food in the Last Year: Never true  Transportation Needs: No Transportation Needs (03/16/2021)   PRAPARE - Hydrologist (Medical): No  Lack of Transportation (Non-Medical): No  Physical Activity: Insufficiently Active (03/16/2021)   Exercise Vital Sign    Days of Exercise per Week: 3 days    Minutes of Exercise per Session: 30 min  Stress: Stress Concern Present (03/16/2021)   [place]    Feeling of Stress : To some extent  Social Connections: Not on file     Family History: The patient's family history includes AAA (abdominal aortic aneurysm) in her father; Breast cancer in her cousin and sister; Cancer in her father and mother; Colon cancer in her paternal uncle; Deep vein thrombosis in her father; Diabetes in her father; Heart attack in her brother; Heart disease in her father; Hyperlipidemia in her father, mother, sister,  and sister; Hypertension in her brother, father, mother, sister, and sister; Mental illness in her sister; Pancreatic cancer in her mother; Peripheral vascular disease in her father; Varicose Veins in her mother.  ROS:   Please see the history of present illness.    Dry skin.  Otherwise no complaints.  Wants an African-American dermatologist.  All other systems reviewed and are negative.  EKGs/Labs/Other Studies Reviewed:    The following studies were reviewed today:   2 D Doppler ECHOCARDIOGRAM 2018: Study Conclusions   - Left ventricle: The cavity size was normal. Wall thickness was    increased in a pattern of moderate LVH. Systolic function was    normal. The estimated ejection fraction was in the range of 60%    to 65%. Wall motion was normal; there were no regional wall    motion abnormalities. Doppler parameters are consistent with    abnormal left ventricular relaxation (grade 1 diastolic    dysfunction).  - Atrial septum: No defect or patent foramen ovale was identified.    Coronary Calcium Score 03/25/2021: IMPRESSION: 1. Coronary calcium score of 73. This was 41 percentile for age and sex matched control.   2.  Nonobstructive CAD   3. Calcified plaque in the ostial left main causes minimal (0-24%) stenosis   4. Noncalcified plaque in the proximal LAD causes minimal (0-24%) stenosis   5.  Calcified plaque in the ramus causes minimal (0-24%) stenosis    EKG:  EKG normal sinus rhythm with normal overall appearance of EKG.  Recent Labs: No results found for requested labs within last 365 days.  Recent Lipid Panel    Component Value Date/Time   CHOL 174 07/07/2020 0834   TRIG 98 07/07/2020 0834   HDL 63 07/07/2020 0834   CHOLHDL 2.8 07/07/2020 0834   LDLCALC 93 07/07/2020 0834    Physical Exam:    VS:  BP (!) 148/88   Pulse 90   Ht '5\' 1"'$  (1.549 m)   Wt 158 lb (71.7 kg)   SpO2 96%   BMI 29.85 kg/m     Wt Readings from Last 3 Encounters:  08/30/22 158  lb (71.7 kg)  08/19/21 164 lb 4.8 oz (74.5 kg)  06/24/21 161 lb 3.2 oz (73.1 kg)     GEN: Overweight. No acute distress HEENT: Normal NECK: No JVD. LYMPHATICS: No lymphadenopathy CARDIAC: No murmur. RRR no gallop, or edema. VASCULAR:  Normal Pulses. No bruits. RESPIRATORY:  Clear to auscultation without rales, wheezing or rhonchi  ABDOMEN: Soft, non-tender, non-distended, No pulsatile mass, MUSCULOSKELETAL: No deformity  SKIN: Warm and dry NEUROLOGIC:  Alert and oriented x 3 PSYCHIATRIC:  Normal affect   ASSESSMENT:    1. Chest pain of uncertain  etiology   2. Primary hypertension   3. Daytime somnolence   4. Mixed hyperlipidemia   5. Overweight    PLAN:    In order of problems listed above:  Resolved. Borderline control.  Target 130/80.  Continue carvedilol and amlodipine.  Consider adding a diuretic if she continues to push the limit of systolic and diastolic blood pressure elevation.  See below. Resolved Continue Zetia 10 mg/day.  She states it causes her to feel fatigued.  She has had difficulty with statins.  LDL is quite elevated above 200.  She needs lipid clinic follow-up.  States she will discuss this with her primary.  She may be a candidate for bempedoic acid, PCSK9, or Leqvio. Discussed exercise.  Overall education and awareness concerning primary/secondary risk prevention was discussed in detail: LDL less than 70, hemoglobin A1c less than 7, blood pressure target less than 130/80 mmHg, >150 minutes of moderate aerobic activity per week, avoidance of smoking, weight control (via diet and exercise), and continued surveillance/management of/for obstructive sleep apnea.   Target BP: <130/80 mmHg  Diet and lifestyle measures for BP control were reviewed in detail: Low sodium diet (<2.5 gm daily); alcohol restriction (<3 ounces per day); weight loss (Mediterranean); avoid non-steroidal agents; > 6 hours sleep per day; 150 min moderate exercise per week. Medical regimen  will include at least 2 agents. Resistant hypertension if not controlled on 3 agents. Consider further evaluation: Sleep study to r/o OSA; Renal angiogram; Primary hyperaldonism and Pheochromocytoma w/u. After 3 agents, consider MRA (spironolactone)/ Epleronone), hydralazine, beta-blocker, and Minoxidil if not already in use due to patient profile.    Medication Adjustments/Labs and Tests Ordered: Current medicines are reviewed at length with the patient today.  Concerns regarding medicines are outlined above.  No orders of the defined types were placed in this encounter.  No orders of the defined types were placed in this encounter.   There are no Patient Instructions on file for this visit.   Signed, Sinclair Grooms, MD  08/30/2022 9:35 AM    [place]

## 2022-08-30 ENCOUNTER — Encounter: Payer: Self-pay | Admitting: Interventional Cardiology

## 2022-08-30 ENCOUNTER — Ambulatory Visit: Payer: Medicare Other | Attending: Interventional Cardiology | Admitting: Interventional Cardiology

## 2022-08-30 VITALS — BP 140/84 | HR 90 | Ht 61.0 in | Wt 158.0 lb

## 2022-08-30 DIAGNOSIS — R079 Chest pain, unspecified: Secondary | ICD-10-CM

## 2022-08-30 DIAGNOSIS — I10 Essential (primary) hypertension: Secondary | ICD-10-CM

## 2022-08-30 DIAGNOSIS — E663 Overweight: Secondary | ICD-10-CM

## 2022-08-30 DIAGNOSIS — E782 Mixed hyperlipidemia: Secondary | ICD-10-CM

## 2022-08-30 DIAGNOSIS — R400 Somnolence: Secondary | ICD-10-CM | POA: Diagnosis not present

## 2022-08-30 NOTE — Patient Instructions (Signed)
Medication Instructions:  Your physician recommends that you continue on your current medications as directed. Please refer to the Current Medication list given to you today.  *If you need a refill on your cardiac medications before your next appointment, please call your pharmacy*  Lab Work: NONE  Testing/Procedures: NONE  Follow-Up: As needed  Continue to monitor blood pressure and cholesterol with primary care provider.  Important Information About Sugar

## 2022-09-02 DIAGNOSIS — R5383 Other fatigue: Secondary | ICD-10-CM | POA: Diagnosis not present

## 2022-09-02 DIAGNOSIS — Z23 Encounter for immunization: Secondary | ICD-10-CM | POA: Diagnosis not present

## 2022-09-02 DIAGNOSIS — K5900 Constipation, unspecified: Secondary | ICD-10-CM | POA: Diagnosis not present

## 2022-09-02 DIAGNOSIS — I10 Essential (primary) hypertension: Secondary | ICD-10-CM | POA: Diagnosis not present

## 2022-09-17 DIAGNOSIS — L818 Other specified disorders of pigmentation: Secondary | ICD-10-CM | POA: Diagnosis not present

## 2022-09-22 DIAGNOSIS — I10 Essential (primary) hypertension: Secondary | ICD-10-CM | POA: Diagnosis not present

## 2022-09-27 DIAGNOSIS — R9389 Abnormal findings on diagnostic imaging of other specified body structures: Secondary | ICD-10-CM | POA: Diagnosis not present

## 2022-10-04 DIAGNOSIS — K5900 Constipation, unspecified: Secondary | ICD-10-CM | POA: Diagnosis not present

## 2022-10-14 ENCOUNTER — Other Ambulatory Visit: Payer: Self-pay | Admitting: Family Medicine

## 2022-10-14 DIAGNOSIS — Z1231 Encounter for screening mammogram for malignant neoplasm of breast: Secondary | ICD-10-CM

## 2022-10-14 DIAGNOSIS — E2839 Other primary ovarian failure: Secondary | ICD-10-CM

## 2022-10-15 ENCOUNTER — Other Ambulatory Visit: Payer: Medicare Other

## 2022-11-02 ENCOUNTER — Ambulatory Visit: Payer: Medicare Other

## 2022-11-02 DIAGNOSIS — Z23 Encounter for immunization: Secondary | ICD-10-CM | POA: Diagnosis not present

## 2022-11-02 DIAGNOSIS — E785 Hyperlipidemia, unspecified: Secondary | ICD-10-CM | POA: Diagnosis not present

## 2022-11-02 DIAGNOSIS — N644 Mastodynia: Secondary | ICD-10-CM | POA: Diagnosis not present

## 2022-11-02 DIAGNOSIS — M7989 Other specified soft tissue disorders: Secondary | ICD-10-CM | POA: Diagnosis not present

## 2022-11-02 DIAGNOSIS — Z0000 Encounter for general adult medical examination without abnormal findings: Secondary | ICD-10-CM | POA: Diagnosis not present

## 2022-11-02 DIAGNOSIS — R739 Hyperglycemia, unspecified: Secondary | ICD-10-CM | POA: Diagnosis not present

## 2022-11-02 DIAGNOSIS — I10 Essential (primary) hypertension: Secondary | ICD-10-CM | POA: Diagnosis not present

## 2022-11-02 DIAGNOSIS — Z79899 Other long term (current) drug therapy: Secondary | ICD-10-CM | POA: Diagnosis not present

## 2022-11-04 ENCOUNTER — Telehealth: Payer: Self-pay | Admitting: Interventional Cardiology

## 2022-11-04 NOTE — Telephone Encounter (Signed)
Pt c/o medication issue:  1. Name of Medication: amLODipine (NORVASC) 5 MG tablet   2. How are you currently taking this medication (dosage and times per day)? Take 5 mg by mouth daily. - Oral   3. Are you having a reaction (difficulty breathing--STAT)?   4. What is your medication issue? Enid Derry from [facility] calling on behalf of Dr. Lindell Noe, she wants to know if Dr. Tamala Julian would like to decrease or discontinue this medication due to swelling.

## 2022-11-04 NOTE — Telephone Encounter (Signed)
Spoke with Enid Derry from [facility] with Dr. Rosario Jacks office.  Advised that Dr. Tamala Julian is not in the office and if Dr. Lindell Noe feels that changes are needed to go ahead as pt is in office with MD.  Enid Derry reports Dr. Lindell Noe does not want to take over since Dr. Tamala Julian manages medication. Enid Derry expresses pt GFR is 49 and has leg swelling.  I advised her that we do not have access to their system.  She says labs were faxed over today.  Pt BP today 131/73.   Will send to Dr. Tamala Julian for input.

## 2022-11-08 NOTE — Telephone Encounter (Signed)
Spoke with Enid Derry at [facility].  Discussed Dr. Thompson Caul recommendations: Okay to stop Amlodipine. Would recommend low dose HCTZ as replacement.    Enid Derry verbalized understanding and states she will relay information to Dr. Lindell Noe.  Will print labs for Dr. Tamala Julian to review when he is in the office tomorrow.  Shirley expressed appreciation for follow-up.

## 2022-11-10 ENCOUNTER — Other Ambulatory Visit: Payer: Self-pay | Admitting: Family Medicine

## 2022-11-10 DIAGNOSIS — N644 Mastodynia: Secondary | ICD-10-CM

## 2022-11-17 ENCOUNTER — Ambulatory Visit: Payer: Medicare Other | Attending: Cardiology | Admitting: Pharmacist

## 2022-11-17 DIAGNOSIS — I10 Essential (primary) hypertension: Secondary | ICD-10-CM | POA: Diagnosis not present

## 2022-11-17 DIAGNOSIS — E782 Mixed hyperlipidemia: Secondary | ICD-10-CM | POA: Diagnosis not present

## 2022-11-17 MED ORDER — REPATHA SURECLICK 140 MG/ML ~~LOC~~ SOAJ: 140.0000 mg | SUBCUTANEOUS | 12 refills | Status: DC

## 2022-11-17 NOTE — Progress Notes (Signed)
Patient ID: Katie Potts                 DOB: [birth year]                    MRN: [id number]      HPI: Katie Potts is a 71 y.o. female patient referred to lipid clinic by Dr. Tamala Julian. PMH is significant for hyperlipidemia, coronary artery calcification (03/2021- 73, 80th percentile), primary hypertension, chronic diastolic heart failure, snoring, TIA (2020), and palpitations. Her last LDL-C was 191 on 11/02/2022 on no medication. Patient had complaints that Zetia makes her feel fatigued and  has stopped for the last 3 months. She is being referred to discuss other options [id number].   Patient presented with concerns for her total cholesterol and wanting to manage her lipids since not on any medications at this time. Discussed the use of Repatha and patient okay with starting the medication after it is approved through insurance and for the [employer]. Showed the patient how to use the Repatha injection and had she was able to demonstrate correct technique. Discussed medication  pearls and provided patient with clinical instruction sheet. She had concerns about starting her HCTZ 12.5 mg daily due to the side effect stating that can increase cholesterol panel and triglycerides, plus increase in weight gain and abnormal heart rhythm. It was explained to her that the increase in lipid panel readings is not a major side effect and even if she does not take it, it will not lower her cholesterol. Discussed the benefits of Repatha will outweigh anything that the HCTZ 12.5 mg does with the lipid panel. Her concerns for the abnormal heart rhythm was explained that it occurs through lowering her electrolytes and why it is important to follow up with lab work to make sure she is still in goal. Patient is willing to start taking the medication.   Discussed lifestyle and patient enjoys eating vegetables such as asparagus, zucchini, squash and likes lean proteins such as chicken and fish. She typically does not season  her food with salt and uses cayenne pepper, garlic and onion powder. She has not increased her physical activity and suggested ways that she could and to reach goal of 30 minutes per day or 150 minutes per week.  Current Medications: None Intolerances: pravastatin (rash), rosuvastatin 20, 30 mg every other day (myalgias), zetia 10 mg daily (fatigue) Risk Factors: HTN, coronary artery calcification, cdHF LDL goal: <70  Diet: likes her asparagus, zucchini, squash, seafood, chicken Seasons food with cayenne pepper, salt, garlic/onion powder  Exercise: does not partake in physical activity at this time  Family History:  Family History  Problem Relation Age of Onset   Pancreatic cancer Mother    Cancer Mother    Hyperlipidemia Mother    Hypertension Mother    Varicose Veins Mother    Hypertension Sister    Hyperlipidemia Sister    Heart attack Brother    Mental illness Sister    Hyperlipidemia Sister    Hypertension Sister    Breast cancer Sister        unsure of age   Hypertension Brother    Cancer Father    Deep vein thrombosis Father    Diabetes Father    Heart disease Father    Hyperlipidemia Father    Hypertension Father    Peripheral vascular disease Father        amputation   AAA (abdominal aortic aneurysm) Father    Colon  cancer Paternal Uncle    Breast cancer Cousin        unsure of age   Social History:  reports that she has never smoked. She has never used smokeless tobacco. She reports that she does not drink alcohol and does not use drugs.   Labs 11/02/2022: LDL-C 191, TC 277, Hdl-C 69, TG 102  Past Medical History:  Diagnosis Date   Asthma    Chest pain of uncertain etiology 0/09/2724   Depression    MAJOR DEPRESSION, DR. KAUR;PSYCHIATRIST   HTN (hypertension) 07/06/2019   Hyperlipidemia    Hypokalemia 03/16/2021   Snoring 03/16/2021    Current Outpatient Medications on File Prior to Visit  Medication Sig Dispense Refill   amLODipine (NORVASC) 5 MG  tablet Take 5 mg by mouth daily.     aspirin EC 81 MG tablet Take 81 mg by mouth daily.     carvedilol (COREG) 6.25 MG tablet Take 1 tablet (6.25 mg total) by mouth 2 (two) times daily with a meal. (Patient not taking: Reported on 08/30/2022) 30 tablet 6   clobetasol cream (TEMOVATE) 3.66 % Apply 1 Application topically 2 (two) times daily.     clonazePAM (KLONOPIN) 1 MG tablet Take 1 mg by mouth 2 (two) times daily as needed for anxiety.     diphenhydrAMINE HCl (BENADRYL ALLERGY PO) Take 2 tablets by mouth as needed.     ezetimibe (ZETIA) 10 MG tablet Take 1 tablet (10 mg total) by mouth daily. 30 tablet 5   FLUoxetine (PROZAC) 20 MG capsule Take 20 mg by mouth every morning.     fluticasone (CUTIVATE) 0.05 % cream SMARTSIG:Topical 1-2 Times Daily PRN     zolpidem (AMBIEN) 10 MG tablet Take 10 mg by mouth at bedtime as needed for sleep.     No current facility-administered medications on file prior to visit.    Allergies  Allergen Reactions   Losartan     myalgias   Pravastatin     myalgias   Rosuvastatin     myalgias   Seroquel [Quetiapine Fumarate] Swelling    Assessment/Plan:  1. Hyperlipidemia -   Hyperlipemia Assessment: Patient has documented intolerances to statin medications and zetia 10 mg daily for which she stopped 3 months ago She had recent lipid panel on 11/02/2022: LDL-C 191, TC 277, Hdl-C 69, TG 102 Patient wants to try Repatha injection to manage her cholesterol Had questions about her HCTZ 12.5 mg and potential to worsen her lipid panel, abnormal heart rate, and cause weight gain Has been trying to eat more vegetables and lean proteins Does not have physical activity in her routine at this time  Plan: Start Repatha 140 mg subq every 14 days after prior authorization and [employer] grant are approved Discussed proper usage of Repatha and demonstrated on how to use the pen Start HCTZ 12.5 mg daily; will monjtor electrolytes for heart rhtyhm and lipid panel for  Repatha. Should not be concerned of weight gain and to take in the AM Continue current diet and increase vegetable consumption and limit foods high in salts: deli meats, pickled foods, fired foods Increase physical activity to 30 minutes per day or 150 minutes per week to have overall benefit for your health  Thank you,   Ramond Dial, Pharm.D, BCPS, CPP [facility]  [address] [place], [place] [address]  Phone: [phone]; Fax: [phone]

## 2022-11-17 NOTE — Assessment & Plan Note (Signed)
Assessment: Patient has documented intolerances to statin medications and zetia 10 mg daily for which she stopped 3 months ago She had recent lipid panel on 11/02/2022: LDL-C 191, TC 277, Hdl-C 69, TG 102 Patient wants to try Repatha injection to manage her cholesterol Had questions about her HCTZ 12.5 mg and potential to worsen her lipid panel, abnormal heart rate, and cause weight gain Has been trying to eat more vegetables and lean proteins Does not have physical activity in her routine at this time  Plan: Start Repatha 140 mg subq every 14 days after prior authorization and [employer] grant are approved Discussed proper usage of Repatha and demonstrated on how to use the pen Start HCTZ 12.5 mg daily; will monjtor electrolytes for heart rhtyhm and lipid panel for Repatha. Should not be concerned of weight gain and to take in the AM Continue current diet and increase vegetable consumption and limit foods high in salts: deli meats, pickled foods, fired foods Increase physical activity to 30 minutes per day or 150 minutes per week to have overall benefit for your health

## 2022-11-17 NOTE — Patient Instructions (Signed)
-  Start Repatha 140 mg every 14 days -Contineu diet -Increase exercise ise to 30 minutes per day or 150 minutes/week -Start HCTZ 12.5 mg qAM and follow up with lab work

## 2022-11-19 ENCOUNTER — Telehealth: Payer: Self-pay | Admitting: Interventional Cardiology

## 2022-11-19 ENCOUNTER — Telehealth: Payer: Self-pay | Admitting: Pharmacist

## 2022-11-19 DIAGNOSIS — E782 Mixed hyperlipidemia: Secondary | ICD-10-CM

## 2022-11-19 NOTE — Telephone Encounter (Signed)
Key: [id number] PA for Repatha new start submitted.

## 2022-11-19 NOTE — Telephone Encounter (Signed)
Repatha approved through 05/21/23 [employer] grant approved through 10/20/23  Gave grant info to patient. Scheduled for labs 1/30  CARD NO. [id number]   CARD STATUS Active   BIN [id number]   PCN [id number]   PC GROUP [id number]

## 2022-11-19 NOTE — Telephone Encounter (Signed)
Pt c/o medication issue:  1. Name of Medication: Evolocumab (REPATHA SURECLICK) 539 MG/ML SOAJ   2. How are you currently taking this medication (dosage and times per day)? Inject every 14 days  3. Are you having a reaction (difficulty breathing--STAT)? no  4. What is your medication issue? [facility] calling to verify if their prior authorization request was received. Phone: [phone] ref: [id number] store [phone]

## 2022-11-19 NOTE — Telephone Encounter (Signed)
Being addressed in other phone encounter from today, PA already submitted.

## 2022-12-10 ENCOUNTER — Ambulatory Visit: Payer: Medicare Other

## 2022-12-21 ENCOUNTER — Other Ambulatory Visit: Payer: Medicare Other

## 2023-01-06 ENCOUNTER — Ambulatory Visit: Payer: Self-pay | Admitting: Nurse Practitioner

## 2023-01-18 ENCOUNTER — Other Ambulatory Visit: Payer: Medicare Other

## 2023-01-19 ENCOUNTER — Ambulatory Visit
Admission: RE | Admit: 2023-01-19 | Discharge: 2023-01-19 | Disposition: A | Discharge status: Home / Self Care | Payer: Medicare Other | Source: Ambulatory Visit | Attending: Family Medicine | Admitting: Family Medicine

## 2023-01-19 ENCOUNTER — Ambulatory Visit: Admission: RE | Admit: 2023-01-19 | Payer: Medicare Other | Source: Ambulatory Visit

## 2023-01-19 ENCOUNTER — Ambulatory Visit: Payer: Medicare Other

## 2023-01-19 DIAGNOSIS — N644 Mastodynia: Secondary | ICD-10-CM | POA: Diagnosis not present

## 2023-02-09 ENCOUNTER — Ambulatory Visit: Payer: Medicare Other | Attending: Internal Medicine

## 2023-02-09 DIAGNOSIS — E782 Mixed hyperlipidemia: Secondary | ICD-10-CM

## 2023-02-10 ENCOUNTER — Telehealth: Payer: Self-pay | Admitting: Pharmacist

## 2023-02-10 DIAGNOSIS — E782 Mixed hyperlipidemia: Secondary | ICD-10-CM

## 2023-02-10 LAB — APOLIPOPROTEIN B: Apolipoprotein B: 93 mg/dL — ABNORMAL HIGH (ref <90)

## 2023-02-10 LAB — LIPID PANEL
Chol/HDL Ratio: 2.8 ratio (ref 0.0–4.4)
Cholesterol, Total: 192 mg/dL (ref 100–199)
HDL: 68 mg/dL (ref >39)
LDL Chol Calc (NIH): 103 mg/dL — ABNORMAL HIGH (ref 0–99)
Triglycerides: 118 mg/dL (ref 0–149)
VLDL Cholesterol Cal: 21 mg/dL (ref 5–40)

## 2023-02-10 MED ORDER — ROSUVASTATIN CALCIUM 5 MG PO TABS: 5.0000 mg | ORAL_TABLET | ORAL | 3 refills | Status: DC

## 2023-02-10 NOTE — Telephone Encounter (Signed)
Reviewed labs with patient. She is willing to try rosuvastatin 29m three times a week. Will start with twice a week. Continue Repatha. She has had no missed doses and no issues with the medications. Follow up labs scheduled for 5/2. She would like Dr. ROval Linseyto be her Cardiologist since Dr. STamala Julianretired.

## 2023-02-15 NOTE — Telephone Encounter (Signed)
Reviewed in TR in basket. Last saw Dr. Tamala Julian 08/30/22 recommended for PRN follow up. Has previously seen Dr. Oval Linsey and myself. Okay to switch to Dr. Oval Linsey as primary cardiologist.   Will route to scheduling team - okay to schedule for 08/2023 with TR.  Loel Dubonnet, NP

## 2023-03-31 ENCOUNTER — Other Ambulatory Visit: Payer: Medicare Other

## 2023-04-21 ENCOUNTER — Ambulatory Visit: Payer: Medicare Other

## 2023-05-09 NOTE — Progress Notes (Signed)
Katie Potts,acting as a Neurosurgeon for Arnette Felts, FNP.,have documented all relevant documentation on the behalf of Arnette Felts, FNP,as directed by  Arnette Felts, FNP while in the presence of Arnette Felts, FNP.    Subjective:     Patient ID: Katie Potts , female    DOB: 1951/03/16 , 72 y.o.   MRN: [id number]   Chief Complaint  Patient presents with   Establish Care    HPI  Patient presents today to establish care, she was seeing Dustin Folks at [place]. She worked for [employer] for 25 years. Her son was murdered at the age of 56 y/o in [place]. She is also divorced. She is retired from [employer]. Her friend drove her to the appt today.   She has not been using any inhalers for asthma exacerbations will have episodes of not being able to breath will use an over the counter spray. HTN for many years and TIA in 2020.   She is only taking the repatha, she is unable to take the rosuvastatin due to muscle aches so she is not able to take this medication. Her f/u is in September with lipid clinic.  Continues to see a psychiatrist for her depression she has been hospitalized once after attempting suicide after her son was murdered. She has not had a sleep study in the past. Her heart skips beats constantly. She had an episode of thinking she was going to pass out. She does not have any family here locally. She does have a few friends locally. She does live alone.   patient reports she compliance with medications. Patient would like to discuss her bowel movements. Patient reports she has a very hard time having a bowel movement, patient report she also passed out Sunday due to not having a bowel movement in a week. Patient also reports she also had a mini stroke in 2020.   Constipation since her 28s. She drinks about 4 bottles of water a day. She has taken linzess in the past for her colonoscopy. Does not take a probiotic. When she is in the sun she blisters. So she does not go  outside. She does have an exercise bike. She has taken metamucil and miralax. She has seen Dr. Guadlupe Spanish about her constipation. She has been feeling nauseated. She is not taking a stool softner. She has taken magnesium citrate to have a bowel movement. Has not had a bowel movement in about 1.5 weeks. She is straining when she goes to the bathroom.   AWV  Her brother passed from a massive heart attack at age 52 y/o  BP Readings from Last 3 Encounters: 05/10/23 : 130/70 08/30/22 : (!) 140/84 08/19/21 : 122/84       Past Medical History:  Diagnosis Date   Asthma    Chest pain of uncertain etiology 03/16/2021   Depression    MAJOR DEPRESSION, DR. KAUR;PSYCHIATRIST   HTN (hypertension) 07/06/2019   Hyperlipidemia    Hypokalemia 03/16/2021   Snoring 03/16/2021     Family History  Problem Relation Age of Onset   Pancreatic cancer Mother    Cancer Mother    Hyperlipidemia Mother    Hypertension Mother    Varicose Veins Mother    Cancer Father    Deep vein thrombosis Father    Diabetes Father    Heart disease Father    Hyperlipidemia Father    Hypertension Father    Peripheral vascular disease Father        amputation  AAA (abdominal aortic aneurysm) Father    Hypertension Sister    Hyperlipidemia Sister    Mental illness Sister    Hyperlipidemia Sister    Hypertension Sister    Breast cancer Sister        unsure of age   Breast cancer Sister        47s   Colon cancer Paternal Uncle    Breast cancer Cousin        unsure of age   Heart attack Brother    Hypertension Brother      Current Outpatient Medications:    albuterol (VENTOLIN HFA) 108 (90 Base) MCG/ACT inhaler, Inhale 2 puffs into the lungs every 6 (six) hours as needed for wheezing or shortness of breath., Disp: 18 g, Rfl: 3   aspirin EC 81 MG tablet, Take 81 mg by mouth daily., Disp: , Rfl:    clonazePAM (KLONOPIN) 1 MG tablet, Take 1 mg by mouth 2 (two) times daily as needed for anxiety., Disp: , Rfl:     diphenhydrAMINE HCl (BENADRYL ALLERGY PO), Take 2 tablets by mouth as needed., Disp: , Rfl:    docusate sodium (COLACE) 100 MG capsule, Take 1 capsule (100 mg total) by mouth daily as needed., Disp: 30 capsule, Rfl: 1   Evolocumab (REPATHA SURECLICK) 140 MG/ML SOAJ, Inject 140 mg into the skin every 14 (fourteen) days., Disp: 2 mL, Rfl: 12   FLUoxetine (PROZAC) 20 MG capsule, Take 20 mg by mouth every morning., Disp: , Rfl:    hydrochlorothiazide (HYDRODIURIL) 12.5 MG tablet, Take 1 tablet (12.5 mg total) by mouth daily., Disp: 90 tablet, Rfl: 1   lubiprostone (AMITIZA) 8 MCG capsule, Take 1 capsule (8 mcg total) by mouth daily with breakfast., Disp: 30 capsule, Rfl: 2   zolpidem (AMBIEN) 10 MG tablet, Take 10 mg by mouth at bedtime as needed for sleep., Disp: , Rfl:    fluticasone (CUTIVATE) 0.05 % cream, SMARTSIG:Topical 1-2 Times Daily PRN (Patient not taking: Reported on 05/10/2023), Disp: , Rfl:    rosuvastatin (CRESTOR) 5 MG tablet, Take 1 tablet (5 mg total) by mouth 3 (three) times a week. (Patient not taking: Reported on 05/10/2023), Disp: 36 tablet, Rfl: 3   Allergies  Allergen Reactions   Losartan     myalgias   Pravastatin     myalgias   Rosuvastatin     myalgias   Seroquel [Quetiapine Fumarate] Swelling     Review of Systems  Constitutional: Negative.   Respiratory: Negative.    Cardiovascular: Negative.   Gastrointestinal:  Positive for constipation and nausea. Negative for abdominal distention, diarrhea and vomiting.  Skin:  Positive for color change (scattered light colored spots).  Psychiatric/Behavioral: Negative.       Today's Vitals   05/10/23 0854  BP: 130/70  Pulse: 68  Temp: 98.7 F (37.1 C)  TempSrc: Oral  Weight: 157 lb 9.6 oz (71.5 kg)  Height: 5\' 1"  (1.549 m)  PainSc: 0-No pain   Body mass index is 29.78 kg/m.  Wt Readings from Last 3 Encounters:  05/10/23 157 lb 9.6 oz (71.5 kg)  08/30/22 158 lb (71.7 kg)  08/19/21 164 lb 4.8 oz (74.5 kg)     The 10-year ASCVD risk score (Arnett DK, et al., 2019) is: 12.5%   Values used to calculate the score:     Age: 48 years     Sex: Female     Is Non-Hispanic African American: Yes     Diabetic: No  Tobacco smoker: No     Systolic Blood Pressure: 130 mmHg     Is BP treated: Yes     HDL Cholesterol: 62 mg/dL     Total Cholesterol: 201 mg/dL  Objective:  Physical Exam Vitals reviewed.  Constitutional:      Appearance: She is well-developed.  HENT:     Head: Normocephalic and atraumatic.  Eyes:     Pupils: Pupils are equal, round, and reactive to light.  Cardiovascular:     Rate and Rhythm: Normal rate and regular rhythm.     Pulses: Normal pulses.     Heart sounds: Normal heart sounds. No murmur heard. Pulmonary:     Effort: Pulmonary effort is normal. No respiratory distress.     Breath sounds: Normal breath sounds. No wheezing.  Musculoskeletal:        General: Normal range of motion.  Skin:    General: Skin is warm and dry.     Capillary Refill: Capillary refill takes less than 2 seconds.  Neurological:     General: No focal deficit present.     Mental Status: She is alert and oriented to person, place, and time.     Cranial Nerves: No cranial nerve deficit.  Psychiatric:        Mood and Affect: Mood normal.         Assessment And Plan:     1. Essential hypertension Comments: Blood pressure is fairly controlled, continue current medications - hydrochlorothiazide (HYDRODIURIL) 12.5 MG tablet; Take 1 tablet (12.5 mg total) by mouth daily.  Dispense: 90 tablet; Refill: 1 - Comprehensive metabolic panel - Microalbumin / Creatinine Urine Ratio  2. Mixed hyperlipidemia Comments: Will check lipids. She is not on a statin reports having myopathy  3. Chronic idiopathic constipation Comments: Will try her on amitiza due to long history of constipation. - lubiprostone (AMITIZA) 8 MCG capsule; Take 1 capsule (8 mcg total) by mouth daily with breakfast.  Dispense:  30 capsule; Refill: 2 - docusate sodium (COLACE) 100 MG capsule; Take 1 capsule (100 mg total) by mouth daily as needed.  Dispense: 30 capsule; Refill: 1  4. Atherosclerotic peripheral vascular disease (HCC) Comments: ASCVD score of 12.5%., continue her Repatha  5. Herpes zoster vaccination declined  6. Tetanus, diphtheria, and acellular pertussis (Tdap) vaccination declined  7. Adult BMI 29.0-29.9 kg/sq m  8. Statin myopathy Previous history of myopathy per patient  9. History of asthma - albuterol (VENTOLIN HFA) 108 (90 Base) MCG/ACT inhaler; Inhale 2 puffs into the lungs every 6 (six) hours as needed for wheezing or shortness of breath.  Dispense: 18 g; Refill: 3  10. Establishing care with new doctor, encounter for Patient is here to establish care. Went over patient medical, family, social and surgical history. Reviewed with patient their medications and any allergies  Reviewed with patient their sexual orientation, drug/tobacco and alcohol use Dicussed any new concerns with patient  recommended patient comes in for a physical exam and complete blood work.  Educated patient about the importance of annual screenings and immunizations.  Advised patient to eat a healthy diet along with exercise for atleast 30-45 min atleast 4-5 days of the week.    Return for 4-6 week f/u constipation; AWV if not had.  Patient was given opportunity to ask questions. Patient verbalized understanding of the plan and was able to repeat key elements of the plan. All questions were answered to their satisfaction.  Arnette Felts, FNP   I, Arnette Felts, FNP, have  reviewed all documentation for this visit. The documentation on 05/10/23 for the exam, diagnosis, procedures, and orders are all accurate and complete.   IF YOU HAVE BEEN REFERRED TO A SPECIALIST, IT MAY TAKE 1-2 WEEKS TO SCHEDULE/PROCESS THE REFERRAL. IF YOU HAVE NOT HEARD FROM US/SPECIALIST IN TWO WEEKS, PLEASE GIVE [place] A CALL AT [phone] X  [phone].   THE PATIENT IS ENCOURAGED TO PRACTICE SOCIAL DISTANCING DUE TO THE COVID-19 PANDEMIC.

## 2023-05-10 ENCOUNTER — Encounter: Payer: Self-pay | Admitting: Nurse Practitioner

## 2023-05-10 ENCOUNTER — Ambulatory Visit (INDEPENDENT_AMBULATORY_CARE_PROVIDER_SITE_OTHER): Payer: Medicare Other | Admitting: Nurse Practitioner

## 2023-05-10 VITALS — BP 130/70 | HR 68 | Temp 98.7°F | Ht 61.0 in | Wt 157.6 lb

## 2023-05-10 DIAGNOSIS — I70209 Unspecified atherosclerosis of native arteries of extremities, unspecified extremity: Secondary | ICD-10-CM

## 2023-05-10 DIAGNOSIS — Z7689 Persons encountering health services in other specified circumstances: Secondary | ICD-10-CM

## 2023-05-10 DIAGNOSIS — I10 Essential (primary) hypertension: Secondary | ICD-10-CM

## 2023-05-10 DIAGNOSIS — T466X5A Adverse effect of antihyperlipidemic and antiarteriosclerotic drugs, initial encounter: Secondary | ICD-10-CM | POA: Diagnosis not present

## 2023-05-10 DIAGNOSIS — Z8709 Personal history of other diseases of the respiratory system: Secondary | ICD-10-CM

## 2023-05-10 DIAGNOSIS — E782 Mixed hyperlipidemia: Secondary | ICD-10-CM | POA: Diagnosis not present

## 2023-05-10 DIAGNOSIS — K5904 Chronic idiopathic constipation: Secondary | ICD-10-CM | POA: Diagnosis not present

## 2023-05-10 DIAGNOSIS — G720 Drug-induced myopathy: Secondary | ICD-10-CM | POA: Diagnosis not present

## 2023-05-10 DIAGNOSIS — Z2821 Immunization not carried out because of patient refusal: Secondary | ICD-10-CM | POA: Diagnosis not present

## 2023-05-10 DIAGNOSIS — Z6829 Body mass index (BMI) 29.0-29.9, adult: Secondary | ICD-10-CM

## 2023-05-10 MED ORDER — HYDROCHLOROTHIAZIDE 12.5 MG PO TABS: 12.5000 mg | ORAL_TABLET | Freq: Every day | ORAL | 1 refills | Status: DC

## 2023-05-10 MED ORDER — LUBIPROSTONE 8 MCG PO CAPS: 8.0000 ug | ORAL_CAPSULE | Freq: Every day | ORAL | 2 refills | Status: DC

## 2023-05-10 MED ORDER — DOCUSATE SODIUM 100 MG PO CAPS: 100.0000 mg | ORAL_CAPSULE | Freq: Every day | ORAL | 1 refills | Status: DC | PRN

## 2023-05-10 MED ORDER — ALBUTEROL SULFATE HFA 108 (90 BASE) MCG/ACT IN AERS
2.0000 | INHALATION_SPRAY | Freq: Four times a day (QID) | RESPIRATORY_TRACT | 3 refills | Status: AC | PRN
Start: 2023-05-10 — End: ?

## 2023-05-11 LAB — COMPREHENSIVE METABOLIC PANEL
ALT: 13 IU/L (ref 0–32)
AST: 18 IU/L (ref 0–40)
Albumin/Globulin Ratio: 1.5 (ref 1.2–2.2)
Albumin: 4.3 g/dL (ref 3.9–4.9)
Alkaline Phosphatase: 118 IU/L (ref 44–121)
BUN/Creatinine Ratio: 17 (ref 12–28)
BUN: 17 mg/dL (ref 8–27)
Bilirubin Total: 0.5 mg/dL (ref 0.0–1.2)
CO2: 24 mmol/L (ref 20–29)
Calcium: 9.6 mg/dL (ref 8.7–10.3)
Chloride: 103 mmol/L (ref 96–106)
Creatinine, Ser: 1.02 mg/dL — ABNORMAL HIGH (ref 0.57–1.00)
Globulin, Total: 2.9 g/dL (ref 1.5–4.5)
Glucose: 97 mg/dL (ref 70–99)
Potassium: 3.7 mmol/L (ref 3.5–5.2)
Sodium: 141 mmol/L (ref 134–144)
Total Protein: 7.2 g/dL (ref 6.0–8.5)
eGFR: 60 mL/min/{1.73_m2} (ref >59)

## 2023-05-11 LAB — MICROALBUMIN / CREATININE URINE RATIO
Creatinine, Urine: 291.9 mg/dL
Microalb/Creat Ratio: 12 mg/g creat (ref 0–29)
Microalbumin, Urine: 33.8 ug/mL

## 2023-05-18 ENCOUNTER — Ambulatory Visit: Payer: Medicare Other | Attending: Cardiovascular Disease

## 2023-05-18 DIAGNOSIS — E782 Mixed hyperlipidemia: Secondary | ICD-10-CM

## 2023-05-19 LAB — HEPATIC FUNCTION PANEL
ALT: 14 IU/L (ref 0–32)
AST: 17 IU/L (ref 0–40)
Albumin: 4 g/dL (ref 3.9–4.9)
Alkaline Phosphatase: 111 IU/L (ref 44–121)
Bilirubin Total: <0.2 mg/dL (ref 0.0–1.2)
Bilirubin, Direct: <0.1 mg/dL (ref 0.00–0.40)
Total Protein: 6.8 g/dL (ref 6.0–8.5)

## 2023-05-19 LAB — LIPID PANEL
Chol/HDL Ratio: 3.2 ratio (ref 0.0–4.4)
Cholesterol, Total: 201 mg/dL — ABNORMAL HIGH (ref 100–199)
HDL: 62 mg/dL (ref >39)
LDL Chol Calc (NIH): 117 mg/dL — ABNORMAL HIGH (ref 0–99)
Triglycerides: 122 mg/dL (ref 0–149)
VLDL Cholesterol Cal: 22 mg/dL (ref 5–40)

## 2023-05-23 ENCOUNTER — Ambulatory Visit (INDEPENDENT_AMBULATORY_CARE_PROVIDER_SITE_OTHER): Payer: Medicare Other | Admitting: Nurse Practitioner

## 2023-05-23 ENCOUNTER — Encounter: Payer: Self-pay | Admitting: Nurse Practitioner

## 2023-05-23 VITALS — BP 180/80 | Temp 97.7°F | Ht 61.0 in | Wt 159.2 lb

## 2023-05-23 DIAGNOSIS — I10 Essential (primary) hypertension: Secondary | ICD-10-CM

## 2023-05-23 DIAGNOSIS — E6609 Other obesity due to excess calories: Secondary | ICD-10-CM

## 2023-05-23 DIAGNOSIS — Z23 Encounter for immunization: Secondary | ICD-10-CM | POA: Diagnosis not present

## 2023-05-23 DIAGNOSIS — K5904 Chronic idiopathic constipation: Secondary | ICD-10-CM | POA: Diagnosis not present

## 2023-05-23 DIAGNOSIS — Z6830 Body mass index (BMI) 30.0-30.9, adult: Secondary | ICD-10-CM

## 2023-05-23 DIAGNOSIS — I70209 Unspecified atherosclerosis of native arteries of extremities, unspecified extremity: Secondary | ICD-10-CM

## 2023-05-23 NOTE — Progress Notes (Signed)
Hershal Coria Martin,acting as a Neurosurgeon for Arnette Felts, FNP.,have documented all relevant documentation on the behalf of Arnette Felts, FNP,as directed by  Arnette Felts, FNP while in the presence of Arnette Felts, FNP.    Subjective:     Patient ID: Katie Potts , female    DOB: 1951/06/12 , 72 y.o.   MRN: [id number]   Chief Complaint  Patient presents with   Constipation    HPI  Patient presents today for a constipation f/u, patient reports her constipation is getting better since she started taking the amitiza last week due to the cost for 90 day supply was approximately $79. She is also taking a stool softner as well. She has had good bowel movements since starting. She is now having a bowel movement daily. She took benadryl last night for a rash. She usually sees Tollie Eth for her dermatology needs, she has a reaction to the sun.   Wt Readings from Last 3 Encounters: 05/23/23 : 159 lb 3.2 oz (72.2 kg) 05/10/23 : 157 lb 9.6 oz (71.5 kg) 08/30/22 : 158 lb (71.7 kg)       Past Medical History:  Diagnosis Date   Asthma    Chest pain of uncertain etiology 03/16/2021   Depression    MAJOR DEPRESSION, DR. KAUR;PSYCHIATRIST   HTN (hypertension) 07/06/2019   Hyperlipidemia    Hypokalemia 03/16/2021   Snoring 03/16/2021     Family History  Problem Relation Age of Onset   Pancreatic cancer Mother    Cancer Mother    Hyperlipidemia Mother    Hypertension Mother    Varicose Veins Mother    Cancer Father    Deep vein thrombosis Father    Diabetes Father    Heart disease Father    Hyperlipidemia Father    Hypertension Father    Peripheral vascular disease Father        amputation   AAA (abdominal aortic aneurysm) Father    Hypertension Sister    Hyperlipidemia Sister    Mental illness Sister    Hyperlipidemia Sister    Hypertension Sister    Breast cancer Sister        unsure of age   Breast cancer Sister        34s   Colon cancer Paternal Uncle    Breast cancer  Cousin        unsure of age   Heart attack Brother    Hypertension Brother      Current Outpatient Medications:    albuterol (VENTOLIN HFA) 108 (90 Base) MCG/ACT inhaler, Inhale 2 puffs into the lungs every 6 (six) hours as needed for wheezing or shortness of breath., Disp: 18 g, Rfl: 3   aspirin EC 81 MG tablet, Take 81 mg by mouth daily., Disp: , Rfl:    clonazePAM (KLONOPIN) 1 MG tablet, Take 1 mg by mouth 2 (two) times daily. 1/2 tablet by mouth up to twice daily as needed for anxiety or panic attacks., Disp: , Rfl:    diphenhydrAMINE HCl (BENADRYL ALLERGY PO), Take 2 tablets by mouth as needed., Disp: , Rfl:    docusate sodium (COLACE) 100 MG capsule, Take 1 capsule (100 mg total) by mouth daily as needed., Disp: 30 capsule, Rfl: 1   Evolocumab (REPATHA SURECLICK) 140 MG/ML SOAJ, Inject 140 mg into the skin every 14 (fourteen) days., Disp: 2 mL, Rfl: 12   FLUoxetine (PROZAC) 20 MG capsule, Take 20 mg by mouth every morning., Disp: , Rfl:  fluticasone (CUTIVATE) 0.05 % cream, , Disp: , Rfl:    hydrochlorothiazide (HYDRODIURIL) 12.5 MG tablet, Take 1 tablet (12.5 mg total) by mouth daily., Disp: 90 tablet, Rfl: 1   lubiprostone (AMITIZA) 8 MCG capsule, Take 1 capsule (8 mcg total) by mouth daily with breakfast., Disp: 30 capsule, Rfl: 2   zolpidem (AMBIEN) 10 MG tablet, Take 10 mg by mouth at bedtime as needed for sleep., Disp: , Rfl:    Allergies  Allergen Reactions   Losartan     myalgias   Pravastatin     myalgias   Rosuvastatin     myalgias   Seroquel [Quetiapine Fumarate] Swelling     Review of Systems  Constitutional: Negative.   HENT: Negative.    Eyes: Negative.   Respiratory: Negative.    Cardiovascular: Negative.   Gastrointestinal: Negative.      Today's Vitals   05/23/23 0830 05/23/23 0913  BP: (!) 160/80 (!) 180/80  Temp: 97.7 F (36.5 C)   Weight: 159 lb 3.2 oz (72.2 kg)   Height: 5\' 1"  (1.549 m)   PainSc: 0-No pain    Body mass index is 30.08  kg/m.  The 10-year ASCVD risk score (Arnett DK, et al., 2019) is: 22.4%   Values used to calculate the score:     Age: 63 years     Sex: Female     Is Non-Hispanic African American: Yes     Diabetic: No     Tobacco smoker: No     Systolic Blood Pressure: 180 mmHg     Is BP treated: Yes     HDL Cholesterol: 62 mg/dL     Total Cholesterol: 201 mg/dL  Objective:  Physical Exam Vitals reviewed.  Constitutional:      General: She is not in acute distress.    Appearance: Normal appearance. She is well-developed.  HENT:     Head: Normocephalic and atraumatic.  Eyes:     Pupils: Pupils are equal, round, and reactive to light.  Cardiovascular:     Rate and Rhythm: Normal rate and regular rhythm.     Pulses: Normal pulses.     Heart sounds: Normal heart sounds. No murmur heard. Pulmonary:     Effort: Pulmonary effort is normal. No respiratory distress.     Breath sounds: Normal breath sounds. No wheezing.  Musculoskeletal:        General: Normal range of motion.  Skin:    General: Skin is warm and dry.     Capillary Refill: Capillary refill takes less than 2 seconds.  Neurological:     General: No focal deficit present.     Mental Status: She is alert and oriented to person, place, and time.     Cranial Nerves: No cranial nerve deficit.  Psychiatric:        Mood and Affect: Mood normal.         Assessment And Plan:     1. Chronic idiopathic constipation Comments: Improved and now having daily bowel movements with Amitiza  2. Atherosclerotic peripheral vascular disease (HCC) Comments: Continue f/u with Dr. Duke Salvia  3. Uncontrolled hypertension BP is elevated today, may be related to taking Benadryl yesterday and eating a high salt meal yesterday. She is to return in 1 week for a NV BP check.   3. Need for Tdap vaccination Will give tetanus vaccine today while in office. Refer to order management. TDAP will be administered to adults 34-74 years old every 10 years.  4.  Class 1 obesity due to excess calories with body mass index (BMI) of 30.0 to 30.9 in adult, unspecified whether serious comorbidity present She is encouraged to strive for BMI less than 30 to decrease cardiac risk. Advised to aim for at least 150 minutes of exercise per week.   Return if symptoms worsen or fail to improve, for Schedule AWV in November and 3 months BP f/u; 1 week NV BP check.   Patient was given opportunity to ask questions. Patient verbalized understanding of the plan and was able to repeat key elements of the plan. All questions were answered to their satisfaction.  Arnette Felts, FNP   I, Arnette Felts, FNP, have reviewed all documentation for this visit. The documentation on 05/23/23 for the exam, diagnosis, procedures, and orders are all accurate and complete.   IF YOU HAVE BEEN REFERRED TO A SPECIALIST, IT MAY TAKE 1-2 WEEKS TO SCHEDULE/PROCESS THE REFERRAL. IF YOU HAVE NOT HEARD FROM US/SPECIALIST IN TWO WEEKS, PLEASE GIVE [place] A CALL AT [phone] X [phone].   THE PATIENT IS ENCOURAGED TO PRACTICE SOCIAL DISTANCING DUE TO THE COVID-19 PANDEMIC.

## 2023-06-03 ENCOUNTER — Ambulatory Visit: Payer: Medicare Other

## 2023-06-03 ENCOUNTER — Telehealth: Payer: Self-pay | Admitting: Pharmacist

## 2023-06-03 DIAGNOSIS — E782 Mixed hyperlipidemia: Secondary | ICD-10-CM

## 2023-06-03 NOTE — Telephone Encounter (Signed)
Called and reviewed patients most recent lipid panel. LDL-C is slightly higher than 3 months ago. 3 months ago we dicussed trying rousuvastatin a few times a week. Patient did not tolerate this. Still taking Repatha. Cannot tolerate statins or zetia. We discussed Nexletol. Patient will think about it and let me know next week.

## 2023-06-07 ENCOUNTER — Ambulatory Visit: Payer: Self-pay | Admitting: Nurse Practitioner

## 2023-06-10 NOTE — Telephone Encounter (Signed)
Pt left voicemail for Melissa that she wants to try Nexletol.

## 2023-06-16 MED ORDER — NEXLETOL 180 MG PO TABS: 1.0000 | ORAL_TABLET | Freq: Every day | ORAL | 11 refills | Status: DC

## 2023-06-16 NOTE — Addendum Note (Signed)
Addended by: Malena Peer D on: 06/16/2023 04:21 PM   Modules accepted: Orders

## 2023-06-16 NOTE — Telephone Encounter (Signed)
Patient made aware of approval. Advised to ask pharmacy to link her healthwell grant to this Rx as well. She will see Dr. Duke Salvia at [facility] in Sept and will get repeat labs while she is there.

## 2023-06-16 NOTE — Telephone Encounter (Signed)
Pharmacy Patient Advocate Encounter  Prior Authorization for NEXLETOL has been approved.    PA#  [id number] Effective through 12/20/23  Haze Rushing, CPhT Pharmacy Patient Advocate Specialist Direct Number: [phone] Fax: [phone]

## 2023-06-16 NOTE — Telephone Encounter (Signed)
PA for Nexletol submitted  Key: [id number]

## 2023-06-17 ENCOUNTER — Ambulatory Visit: Payer: Medicare Other

## 2023-06-17 VITALS — BP 130/90 | HR 78 | Temp 98.1°F | Ht 61.0 in | Wt 159.0 lb

## 2023-06-17 DIAGNOSIS — I10 Essential (primary) hypertension: Secondary | ICD-10-CM

## 2023-06-17 NOTE — Patient Instructions (Signed)
Hypertension, Adult Hypertension is another name for high blood pressure. High blood pressure forces your heart to work harder to pump blood. This can cause problems over time. There are two numbers in a blood pressure reading. There is a top number (systolic) over a bottom number (diastolic). It is best to have a blood pressure that is below 120/80. What are the causes? The cause of this condition is not known. Some other conditions can lead to high blood pressure. What increases the risk? Some lifestyle factors can make you more likely to develop high blood pressure: Smoking. Not getting enough exercise or physical activity. Being overweight. Having too much fat, sugar, calories, or salt (sodium) in your diet. Drinking too much alcohol. Other risk factors include: Having any of these conditions: Heart disease. Diabetes. High cholesterol. Kidney disease. Obstructive sleep apnea. Having a family history of high blood pressure and high cholesterol. Age. The risk increases with age. Stress. What are the signs or symptoms? High blood pressure may not cause symptoms. Very high blood pressure (hypertensive crisis) may cause: Headache. Fast or uneven heartbeats (palpitations). Shortness of breath. Nosebleed. Vomiting or feeling like you may vomit (nauseous). Changes in how you see. Very bad chest pain. Feeling dizzy. Seizures. How is this treated? This condition is treated by making healthy lifestyle changes, such as: Eating healthy foods. Exercising more. Drinking less alcohol. Your doctor may prescribe medicine if lifestyle changes do not help enough and if: Your top number is above 130. Your bottom number is above 80. Your personal target blood pressure may vary. Follow these instructions at home: Eating and drinking  If told, follow the DASH eating plan. To follow this plan: Fill one half of your plate at each meal with fruits and vegetables. Fill one fourth of your plate  at each meal with whole grains. Whole grains include whole-wheat pasta, brown rice, and whole-grain bread. Eat or drink low-fat dairy products, such as skim milk or low-fat yogurt. Fill one fourth of your plate at each meal with low-fat (lean) proteins. Low-fat proteins include fish, chicken without skin, eggs, beans, and tofu. Avoid fatty meat, cured and processed meat, or chicken with skin. Avoid pre-made or processed food. Limit the amount of salt in your diet to less than 1,500 mg each day. Do not drink alcohol if: Your doctor tells you not to drink. You are pregnant, may be pregnant, or are planning to become pregnant. If you drink alcohol: Limit how much you have to: 0-1 drink a day for women. 0-2 drinks a day for men. Know how much alcohol is in your drink. In the [place] one drink equals one 12 oz bottle of beer (355 mL), one 5 oz glass of wine (148 mL), or one 1 oz glass of hard liquor (44 mL). Lifestyle  Work with your doctor to stay at a healthy weight or to lose weight. Ask your doctor what the best weight is for you. Get at least 30 minutes of exercise that causes your heart to beat faster (aerobic exercise) most days of the week. This may include walking, swimming, or biking. Get at least 30 minutes of exercise that strengthens your muscles (resistance exercise) at least 3 days a week. This may include lifting weights or doing Pilates. Do not smoke or use any products that contain nicotine or tobacco. If you need help quitting, ask your doctor. Check your blood pressure at home as told by your doctor. Keep all follow-up visits. Medicines Take over-the-counter and prescription medicines   only as told by your doctor. Follow directions carefully. Do not skip doses of blood pressure medicine. The medicine does not work as well if you skip doses. Skipping doses also puts you at risk for problems. Ask your doctor about side effects or reactions to medicines that you should watch  for. Contact a doctor if: You think you are having a reaction to the medicine you are taking. You have headaches that keep coming back. You feel dizzy. You have swelling in your ankles. You have trouble with your vision. Get help right away if: You get a very bad headache. You start to feel mixed up (confused). You feel weak or numb. You feel faint. You have very bad pain in your: Chest. Belly (abdomen). You vomit more than once. You have trouble breathing. These symptoms may be an emergency. Get help right away. Call 911. Do not wait to see if the symptoms will go away. Do not drive yourself to the hospital. Summary Hypertension is another name for high blood pressure. High blood pressure forces your heart to work harder to pump blood. For most people, a normal blood pressure is less than 120/80. Making healthy choices can help lower blood pressure. If your blood pressure does not get lower with healthy choices, you may need to take medicine. This information is not intended to replace advice given to you by your health care provider. Make sure you discuss any questions you have with your health care provider. Document Revised: 09/24/2021 Document Reviewed: 09/24/2021 Elsevier Patient Education  2024 Elsevier Inc.  

## 2023-06-17 NOTE — Progress Notes (Signed)
Patient presents today for bpc. She currently takes hydrochlorothiazide 12.5. she reports taking this morning before apt. Denies headache, chest pain, SOB. Initial bp: 142/90. Bp taken again after 10 minutes:  BP Readings from Last 3 Encounters:  06/17/23 (!) 130/90  05/23/23 (!) 180/80  05/10/23 130/70   Per provider patient has made progress. She is to focus on low salt diet & incrase water intake. Patient has upcoming scheduled appointment in September. Patient will follow up then. Patient aware.

## 2023-07-21 ENCOUNTER — Telehealth (HOSPITAL_BASED_OUTPATIENT_CLINIC_OR_DEPARTMENT_OTHER): Payer: Self-pay | Admitting: Cardiovascular Disease

## 2023-07-21 NOTE — Telephone Encounter (Signed)
Spoke with patient and she is unable to get labs tomorrow so she will get after she sees Dr Duke Salvia 8/5

## 2023-07-21 NOTE — Telephone Encounter (Signed)
Patient would like to know if she needs to have labs prior to her appointment Monday.

## 2023-07-25 ENCOUNTER — Encounter (HOSPITAL_BASED_OUTPATIENT_CLINIC_OR_DEPARTMENT_OTHER): Payer: Self-pay | Admitting: Cardiovascular Disease

## 2023-07-25 ENCOUNTER — Telehealth (HOSPITAL_BASED_OUTPATIENT_CLINIC_OR_DEPARTMENT_OTHER): Payer: Self-pay | Admitting: *Deleted

## 2023-07-25 ENCOUNTER — Ambulatory Visit (HOSPITAL_BASED_OUTPATIENT_CLINIC_OR_DEPARTMENT_OTHER): Payer: Medicare Other | Admitting: Cardiovascular Disease

## 2023-07-25 VITALS — BP 194/96 | HR 65 | Ht 61.0 in | Wt 158.9 lb

## 2023-07-25 DIAGNOSIS — R002 Palpitations: Secondary | ICD-10-CM | POA: Diagnosis not present

## 2023-07-25 DIAGNOSIS — E782 Mixed hyperlipidemia: Secondary | ICD-10-CM | POA: Diagnosis not present

## 2023-07-25 DIAGNOSIS — R0683 Snoring: Secondary | ICD-10-CM

## 2023-07-25 DIAGNOSIS — I10 Essential (primary) hypertension: Secondary | ICD-10-CM | POA: Diagnosis not present

## 2023-07-25 DIAGNOSIS — I70209 Unspecified atherosclerosis of native arteries of extremities, unspecified extremity: Secondary | ICD-10-CM | POA: Diagnosis not present

## 2023-07-25 MED ORDER — VALSARTAN 160 MG PO TABS: 160.0000 mg | ORAL_TABLET | Freq: Every day | ORAL | 3 refills | Status: DC

## 2023-07-25 NOTE — Telephone Encounter (Signed)
Patient is returning call.  °

## 2023-07-25 NOTE — Telephone Encounter (Signed)
Returned call patient and delivered message to patient.

## 2023-07-25 NOTE — Telephone Encounter (Signed)
Patient seen in office today  Dr Duke Salvia wanted patient to be referred to [facility] PREP program  Referral place, Left message to call back

## 2023-07-25 NOTE — Progress Notes (Signed)
Cardiology Office Note:  .    Date:  07/25/2023  ID:  Katie Potts, DOB 12-30-1950, MRN [id number] PCP: Arnette Felts, FNP  [facility] Providers Cardiologist:  Lesleigh Noe, MD (Inactive)     History of Present Illness: .    Katie Potts is a 72 y.o. female with CAD, chronic diastolic heart failure, TIA, hypertension, hyperlipidemia, who presents for follow-up and to establish care with me. She is a former patient of Dr. Katrinka Blazing, last seen by him 08/2022. At that visit her prior palpitations and chest pain had resolved. Blood pressures with borderline control on carvedilol and amlodipine. She was on Zetia 10 mg daily for hyperlipidemia, known statin intolerance. LDL was elevated above 200; she saw our pharmacist 10/2022 and was started on Repatha. Her LDL was 103 in 01/2023 and she was started on 5 mg rosuvastatin daily. However, she was unable to tolerate either rosuvastatin or zetia. Repeat lipids 04/2023 with LDL 117; Nexletol was initiated.  Echo 2018 with LVEF 60-65% and grade 1 diastolic dysfunction. She had a coronary CT 03/2021 revealing a coronary calcium score of 73. There was calcified plaque in the ostial left main and ramus with minimal stenosis; noncalcified plaque in the proximal LAD with minimal stenosis.  Today, she reports struggling with significant family stress. She complains of dizziness and nausea for the past 2 weeks, her symptoms come and go. Her dizzy spells occur without warning. After washing dishes recently she had to lie down on the floor, unable to make it to the bedroom. She often feels dizzy with positional changes such as putting her head down. No loss of consciousness. Her nausea and dizziness are not always concurrent. She feels fatigued all the time, and reports having intermittent racing palpitations for years now. EKG today showed normal sinus rhythm at 65 bpm. Her appetite is inconsistent. Sometimes she may only eat 2 meals in a day. In the office  today her blood pressure is initially elevated to 174/96, and 194/96 on manual recheck. Last week at home she had a reading of 180 systolic, otherwise she consistently sees readings of 170s systolic. Every now and then she notices LE swelling, improved overall since amlodipine was stopped. For activity she is using her exercise bike 2-3 times per week. Feels well with exercise; no anginal symptoms. Breathing is stable. Tries to go out and walk but struggles with sunburn despite sunscreen use. She denies any chest pain, shortness of breath, headaches, orthopnea, or PND.  ROS:  Please see the history of present illness. All other systems are reviewed and negative.  (+) Dizziness (+) Nausea (+) Stress (+) Fatigue (+) Palpitations (+) Loss of appetite (+) Occasional LE edema  Studies Reviewed: .        Risk Assessment/Calculations:     HYPERTENSION CONTROL Vitals:   07/25/23 0912 07/25/23 0919 07/25/23 0937  BP: (!) 178/102 (!) 174/96 (!) 194/96    The patient's blood pressure is elevated above target today.  In order to address the patient's elevated BP: A new medication was prescribed today.          Physical Exam:    VS:  BP (!) 194/96 (BP Location: Right Arm, Patient Position: Sitting, Cuff Size: Normal)   Pulse 65   Ht 5\' 1"  (1.549 m)   Wt 158 lb 14.4 oz (72.1 kg)   BMI 30.02 kg/m  , BMI Body mass index is 30.02 kg/m. GENERAL:  Well appearing HEENT: Pupils equal round and reactive, fundi  not visualized, oral mucosa unremarkable NECK:  No jugular venous distention, waveform within normal limits, carotid upstroke brisk and symmetric, no bruits, no thyromegaly LUNGS:  Clear to auscultation bilaterally HEART:  RRR.  PMI not displaced or sustained,S1 and S2 within normal limits, no S3, no S4, no clicks, no rubs, no murmurs ABD:  Flat, positive bowel sounds normal in frequency in pitch, no bruits, no rebound, no guarding, no midline pulsatile mass, no hepatomegaly, no  splenomegaly EXT:  2 plus pulses throughout, no edema, no cyanosis no clubbing SKIN:  No rashes no nodules NEURO:  Cranial nerves II through XII grossly intact, motor grossly intact throughout PSYCH:  Cognitively intact, oriented to person place and time  Wt Readings from Last 3 Encounters:  07/25/23 158 lb 14.4 oz (72.1 kg)  06/17/23 159 lb (72.1 kg)  05/23/23 159 lb 3.2 oz (72.2 kg)     ASSESSMENT AND PLAN: .       # Hypertension Elevated blood pressure readings at home (up to 180s systolic) and in clinic. Discussed the potential contribution of stress and inconsistent oral intake to blood pressure control. -Add Valsartan 160mg  once daily to current regimen of Hydrochlorothiazide. -Check blood pressure lying, sitting, and standing to assess for orthostatic hypotension. (She was not orthostatic in the office) -Track blood pressure readings twice daily. -Enroll in remote patient monitoring study.  She consents to monitoring in the [employer] system. -Schedule follow-up in hypertension clinic monthly for three months.  # Dizziness Intermittent episodes of dizziness over the past two weeks, sometimes severe enough to necessitate lying down. No associated syncope. -Optimize blood pressure control as above. -Reassess if symptoms persist after blood pressure is better controlled.  # Hyperlipidemia Currently on Repatha and Nexletol. -Continue current regimen. -Check cholesterol levels at end of next month.  Lipids and CMP.  # Stress and Anxiety Chronic stress and anxiety exacerbated by recent family issues and anniversary of son's death. Limited social support network. -Continue therapy with [facility]. -Consider re-engaging with church community. -Enroll in PREP program at [facility] for exercise and potential social interaction.  # General Health Maintenance -Obtain labs in one week to assess kidney function after initiation of Valsartan and to check cholesterol  levels. -Follow-up in clinic in four months.              Dispo:  [facility] in [facility] Hypertension [facility].  I,Mathew Stumpf,acting as a Neurosurgeon for Chilton Si, MD.,have documented all relevant documentation on the behalf of Chilton Si, MD,as directed by  Chilton Si, MD while in the presence of Chilton Si, MD.  I, Florence Antonelli C. Duke Salvia, MD have reviewed all documentation for this visit.  The documentation of the exam, diagnosis, procedures, and orders on 07/25/2023 are all accurate and complete.   Signed, Chilton Si, MD

## 2023-07-25 NOTE — Patient Instructions (Addendum)
Medication Instructions:  START VALSARTAN 160 MG DAILY   Labwork: FASTING LABS IN 1 WEEK   Testing/Procedures: NONE   Follow-Up: 1 month with Pharm D Hypertension Clinic   Any Other Special Instructions Will Be Listed Below (If Applicable). MONITOR YOUR BLOOD PRESSURE TWICE A DAY, LOG IN THE BOOK PROVIDED. BRING THE BOOK AND YOUR BLOOD PRESSURE MACHINE TO YOUR FOLLOW UP IN 1 MONTH   If you need a refill on your cardiac medications before your next appointment, please call your pharmacy.

## 2023-07-29 ENCOUNTER — Telehealth: Payer: Self-pay | Admitting: *Deleted

## 2023-07-29 NOTE — Telephone Encounter (Signed)
Contacted regarding PREP Class referral. She is interested in participating at the [facility]. We will call her back with available class dates at this facility.

## 2023-08-03 DIAGNOSIS — E782 Mixed hyperlipidemia: Secondary | ICD-10-CM | POA: Diagnosis not present

## 2023-08-04 LAB — COMPREHENSIVE METABOLIC PANEL: Creatinine, Ser: 1.21 mg/dL — ABNORMAL HIGH (ref 0.57–1.00)

## 2023-08-04 LAB — LIPID PANEL: LDL Chol Calc (NIH): 66 mg/dL (ref 0–99)

## 2023-08-05 ENCOUNTER — Telehealth (HOSPITAL_BASED_OUTPATIENT_CLINIC_OR_DEPARTMENT_OTHER): Payer: Self-pay

## 2023-08-05 DIAGNOSIS — L905 Scar conditions and fibrosis of skin: Secondary | ICD-10-CM | POA: Diagnosis not present

## 2023-08-05 DIAGNOSIS — L821 Other seborrheic keratosis: Secondary | ICD-10-CM | POA: Diagnosis not present

## 2023-08-05 DIAGNOSIS — L309 Dermatitis, unspecified: Secondary | ICD-10-CM | POA: Diagnosis not present

## 2023-08-05 DIAGNOSIS — L815 Leukoderma, not elsewhere classified: Secondary | ICD-10-CM | POA: Diagnosis not present

## 2023-08-05 NOTE — Telephone Encounter (Addendum)
Results called to patient who verbalizes understanding!     ----- Message from Olene Floss sent at 08/04/2023  8:17 PM EDT ----- Can you let patient know Cholesterol looks good. Uric acid is WNL. Scr up just a little bit probably from addition of ARB and completley normal.

## 2023-08-08 ENCOUNTER — Telehealth: Payer: Self-pay

## 2023-08-08 ENCOUNTER — Other Ambulatory Visit: Payer: Self-pay

## 2023-08-08 DIAGNOSIS — I10 Essential (primary) hypertension: Secondary | ICD-10-CM

## 2023-08-08 MED ORDER — HYDROCHLOROTHIAZIDE 12.5 MG PO TABS
12.5000 mg | ORAL_TABLET | Freq: Every day | ORAL | Status: DC
Start: 2023-08-08 — End: 2023-08-15

## 2023-08-08 NOTE — Telephone Encounter (Signed)
Called to discuss PREP schedule at [facility]; wants to take Sept T/Th 2pm class; will contact end of August with start date and to set up assessment visit.

## 2023-08-11 ENCOUNTER — Telehealth: Payer: Self-pay

## 2023-08-11 ENCOUNTER — Other Ambulatory Visit: Payer: Self-pay | Admitting: Nurse Practitioner

## 2023-08-11 DIAGNOSIS — Z79899 Other long term (current) drug therapy: Secondary | ICD-10-CM | POA: Diagnosis not present

## 2023-08-11 NOTE — Telephone Encounter (Signed)
Returned her call, she is undecided about which PREP class facility she would like to attend; offered [facility] in Sept, but that may be too far to drive; offered [facility] at end of October, not sure she wants to wait that long to start; she will think it over and let me know.

## 2023-08-13 ENCOUNTER — Other Ambulatory Visit: Payer: Self-pay | Admitting: Nurse Practitioner

## 2023-08-13 DIAGNOSIS — K5904 Chronic idiopathic constipation: Secondary | ICD-10-CM

## 2023-08-15 ENCOUNTER — Other Ambulatory Visit: Payer: Self-pay

## 2023-08-15 DIAGNOSIS — I10 Essential (primary) hypertension: Secondary | ICD-10-CM

## 2023-08-15 MED ORDER — HYDROCHLOROTHIAZIDE 12.5 MG PO TABS: 12.5000 mg | ORAL_TABLET | Freq: Every day | ORAL | Status: DC

## 2023-08-16 ENCOUNTER — Other Ambulatory Visit: Payer: Self-pay

## 2023-08-16 DIAGNOSIS — I10 Essential (primary) hypertension: Secondary | ICD-10-CM

## 2023-08-16 MED ORDER — HYDROCHLOROTHIAZIDE 12.5 MG PO TABS: 12.5000 mg | ORAL_TABLET | Freq: Every day | ORAL | Status: DC

## 2023-08-17 ENCOUNTER — Other Ambulatory Visit (HOSPITAL_BASED_OUTPATIENT_CLINIC_OR_DEPARTMENT_OTHER): Payer: Self-pay

## 2023-08-23 ENCOUNTER — Ambulatory Visit (HOSPITAL_BASED_OUTPATIENT_CLINIC_OR_DEPARTMENT_OTHER): Payer: Medicare Other | Admitting: Cardiovascular Disease

## 2023-08-23 NOTE — Progress Notes (Addendum)
Katie Potts, CMA,acting as a Neurosurgeon for Katie Felts, FNP.,have documented all relevant documentation on the behalf of Katie Felts, FNP,as directed by  Katie Felts, FNP while in the presence of Katie Felts, FNP.  Subjective:  Patient ID: Katie Potts , female    DOB: [birth year] , 72 y.o.   MRN: [id number]  Chief Complaint  Patient presents with   Hypertension    HPI  Patient presents today for BP check, Patient reports compliance with medications. She took her medications about 5a this morning, she has not been sleeping or eating.  Patient denies any Chest pain, SOB, and headaches. Patient has no concerns today. Patient reported she is sure her bp is high she is under a lot of stress she stated her sister in [place] has a massive stroke and is currently in coma.  She had to reschedule her appt with Katie Potts due to her possibly having to go home.    She sees Katie Potts (psychologist) - who provides her with her Remus Loffler and clonazepam.       Past Medical History:  Diagnosis Date   Asthma    Chest pain of uncertain etiology 03/16/2021   Depression    MAJOR DEPRESSION, Katie Potts;PSYCHIATRIST   HTN (hypertension) 07/06/2019   Hyperlipidemia    Hypokalemia 03/16/2021   Snoring 03/16/2021     Family History  Problem Relation Age of Onset   Pancreatic cancer Mother    Cancer Mother    Hyperlipidemia Mother    Hypertension Mother    Varicose Veins Mother    Cancer Father    Deep vein thrombosis Father    Diabetes Father    Heart disease Father    Hyperlipidemia Father    Hypertension Father    Peripheral vascular disease Father        amputation   AAA (abdominal aortic aneurysm) Father    Hypertension Sister    Hyperlipidemia Sister    Mental illness Sister    Hyperlipidemia Sister    Hypertension Sister    Breast cancer Sister        unsure of age   Breast cancer Sister        11s   Colon cancer Paternal Uncle    Breast cancer Cousin        unsure of age   Heart  attack Brother    Hypertension Brother      Current Outpatient Medications:    albuterol (VENTOLIN HFA) 108 (90 Base) MCG/ACT inhaler, Inhale 2 puffs into the lungs every 6 (six) hours as needed for wheezing or shortness of breath., Disp: 18 g, Rfl: 3   aspirin EC 81 MG tablet, Take 81 mg by mouth daily., Disp: , Rfl:    Bempedoic Acid (NEXLETOL) 180 MG TABS, Take 1 tablet (180 mg total) by mouth daily., Disp: 30 tablet, Rfl: 11   clonazePAM (KLONOPIN) 1 MG tablet, Take 1 mg by mouth 2 (two) times daily. 1/2 tablet by mouth up to twice daily as needed for anxiety or panic attacks., Disp: , Rfl:    diphenhydrAMINE HCl (BENADRYL ALLERGY PO), Take 2 tablets by mouth as needed., Disp: , Rfl:    docusate sodium (COLACE) 100 MG capsule, Take 1 capsule (100 mg total) by mouth daily as needed., Disp: 30 capsule, Rfl: 1   Evolocumab (REPATHA SURECLICK) 140 MG/ML SOAJ, Inject 140 mg into the skin every 14 (fourteen) days., Disp: 2 mL, Rfl: 12   FLUoxetine (PROZAC) 20 MG capsule, Take 20  mg by mouth every morning., Disp: , Rfl:    hydrochlorothiazide (HYDRODIURIL) 12.5 MG tablet, Take 1 tablet (12.5 mg total) by mouth daily., Disp: , Rfl:    lubiprostone (AMITIZA) 8 MCG capsule, TAKE 1 CAPSULE(8 MCG) BY MOUTH DAILY WITH BREAKFAST, Disp: 30 capsule, Rfl: 2   valsartan (DIOVAN) 160 MG tablet, Take 1 tablet (160 mg total) by mouth daily., Disp: 90 tablet, Rfl: 3   zolpidem (AMBIEN) 10 MG tablet, Take 10 mg by mouth at bedtime as needed for sleep., Disp: , Rfl:    Allergies  Allergen Reactions   Losartan     myalgias   Pravastatin     myalgias   Rosuvastatin     myalgias   Seroquel [Quetiapine Fumarate] Swelling     Review of Systems  Constitutional: Negative.   HENT: Negative.    Eyes: Negative.   Respiratory: Negative.    Cardiovascular: Negative.   Gastrointestinal: Negative.   Psychiatric/Behavioral: Negative.       Today's Vitals   08/24/23 0820  BP: (!) 130/90  Pulse: 60  Temp:  98.6 F (37 C)  Weight: 159 lb 6.4 oz (72.3 kg)  Height: 5\' 1"  (1.549 m)  PainSc: 0-No pain   Body mass index is 30.12 kg/m.  Wt Readings from Last 3 Encounters:  09/15/23 155 lb 6.4 oz (70.5 kg)  08/24/23 159 lb (72.1 kg)  08/24/23 159 lb 6.4 oz (72.3 kg)    The 10-year ASCVD risk score (Arnett DK, et al., 2019) is: 14.9%   Values used to calculate the score:     Age: 48 years     Sex: Female     Is Non-Hispanic African American: Yes     Diabetic: No     Tobacco smoker: No     Systolic Blood Pressure: 168 mmHg     Is BP treated: Yes     HDL Cholesterol: 59 mg/dL     Total Cholesterol: 145 mg/dL  Objective:  Physical Exam Vitals reviewed.  Constitutional:      General: She is not in acute distress.    Appearance: Normal appearance. She is well-developed.  HENT:     Head: Normocephalic and atraumatic.  Eyes:     Pupils: Pupils are equal, round, and reactive to light.  Cardiovascular:     Rate and Rhythm: Normal rate and regular rhythm.     Pulses: Normal pulses.     Heart sounds: Normal heart sounds. No murmur heard. Pulmonary:     Effort: Pulmonary effort is normal. No respiratory distress.     Breath sounds: Normal breath sounds. No wheezing.  Musculoskeletal:        General: Normal range of motion.  Skin:    General: Skin is warm and dry.     Capillary Refill: Capillary refill takes less than 2 seconds.  Neurological:     General: No focal deficit present.     Mental Status: She is alert and oriented to person, place, and time.     Cranial Nerves: No cranial nerve deficit.     Motor: No weakness.  Psychiatric:        Mood and Affect: Mood normal.         Assessment And Plan:  Malignant essential hypertension with congestive heart failure (HCC) Assessment & Plan: Blood pressure is slightly elevated, repeat was elevated. Advised to focus on diet low in salt   Need for influenza vaccination Assessment & Plan: Influenza vaccine administered Encouraged  to take  Tylenol as needed for fever or muscle aches.   Orders: -     Flu Vaccine Trivalent High Dose (Fluad)  Class 1 obesity due to excess calories with body mass index (BMI) of 30.0 to 30.9 in adult, unspecified whether serious comorbidity present Assessment & Plan: She is encouraged to strive for BMI less than 30 to decrease cardiac risk. Advised to aim for at least 150 minutes of exercise per week.    Chronic diastolic heart failure (HCC) Assessment & Plan: Continue f/u with Cardiology     Return for uncontrolled bp 3-80months check.  Patient was given opportunity to ask questions. Patient verbalized understanding of the plan and was able to repeat key elements of the plan. All questions were answered to their satisfaction.    Jeanell Sparrow, FNP, have reviewed all documentation for this visit. The documentation on 09/15/23 for the exam, diagnosis, procedures, and orders are all accurate and complete.   IF YOU HAVE BEEN REFERRED TO A SPECIALIST, IT MAY TAKE 1-2 WEEKS TO SCHEDULE/PROCESS THE REFERRAL. IF YOU HAVE NOT HEARD FROM US/SPECIALIST IN TWO WEEKS, PLEASE GIVE [place] A CALL AT [phone].

## 2023-08-24 ENCOUNTER — Ambulatory Visit: Payer: Medicare Other | Admitting: Nurse Practitioner

## 2023-08-24 ENCOUNTER — Encounter: Payer: Self-pay | Admitting: Nurse Practitioner

## 2023-08-24 ENCOUNTER — Ambulatory Visit (INDEPENDENT_AMBULATORY_CARE_PROVIDER_SITE_OTHER): Payer: Medicare Other

## 2023-08-24 VITALS — BP 130/90 | HR 60 | Temp 98.6°F | Ht 61.0 in | Wt 159.4 lb

## 2023-08-24 VITALS — BP 146/80 | HR 60 | Temp 98.6°F | Ht 61.0 in | Wt 159.0 lb

## 2023-08-24 DIAGNOSIS — E6609 Other obesity due to excess calories: Secondary | ICD-10-CM | POA: Diagnosis not present

## 2023-08-24 DIAGNOSIS — Z23 Encounter for immunization: Secondary | ICD-10-CM | POA: Diagnosis not present

## 2023-08-24 DIAGNOSIS — I5032 Chronic diastolic (congestive) heart failure: Secondary | ICD-10-CM

## 2023-08-24 DIAGNOSIS — I110 Hypertensive heart disease with heart failure: Secondary | ICD-10-CM

## 2023-08-24 DIAGNOSIS — Z6830 Body mass index (BMI) 30.0-30.9, adult: Secondary | ICD-10-CM | POA: Diagnosis not present

## 2023-08-24 DIAGNOSIS — Z0000 Encounter for general adult medical examination without abnormal findings: Secondary | ICD-10-CM

## 2023-08-24 DIAGNOSIS — I10 Essential (primary) hypertension: Secondary | ICD-10-CM | POA: Insufficient documentation

## 2023-08-24 NOTE — Patient Instructions (Signed)
Katie Potts , Thank you for taking time to come for your Medicare Wellness Visit. I appreciate your ongoing commitment to your health goals. Please review the following plan we discussed and let me know if I can assist you in the future.   Referrals/Orders/Follow-Ups/Clinician Recommendations: none  This is a list of the screening recommended for you and due dates:  Health Maintenance  Topic Date Due   COVID-19 Vaccine (1) Never done   Zoster (Shingles) Vaccine (1 of 2) Never done   DEXA scan (bone density measurement)  Never done   Pneumonia Vaccine (1 of 1 - PCV) 05/09/2024*   Hepatitis C Screening  05/09/2024*   Medicare Annual Wellness Visit  08/23/2024   Mammogram  01/19/2025   Colon Cancer Screening  11/03/2031   DTaP/Tdap/Td vaccine (2 - Td or Tdap) 05/22/2033   Flu Shot  Completed   HPV Vaccine  Aged Out  *Topic was postponed. The date shown is not the original due date.    Advanced directives: (Provided) Advance directive discussed with you today. I have provided a copy for you to complete at home and have notarized. Once this is complete, please bring a copy in to our office so we can scan it into your chart.   Next Medicare Annual Wellness Visit scheduled for next year: No, office will schedule  Insert Preventive Care attachment Insert FALL PREVENTION attachment if needed

## 2023-08-24 NOTE — Patient Instructions (Signed)
Make sure to f/u with your Psychologist

## 2023-08-24 NOTE — Progress Notes (Signed)
Subjective:   Katie Potts is a 72 y.o. female who presents for Medicare Annual (Subsequent) preventive examination.  Visit Complete: In person    Review of Systems     Cardiac Risk Factors include: advanced age (>97men, >66 women);dyslipidemia;hypertension     Objective:    Today's Vitals   08/24/23 0849  BP: (!) 130/90  Pulse: 60  Temp: 98.6 F (37 C)  TempSrc: Oral  Weight: 159 lb (72.1 kg)  Height: 5\' 1"  (1.549 m)   Body mass index is 30.04 kg/m.     08/24/2023    8:54 AM 11/06/2019   10:53 AM 04/18/2015    4:41 PM  Advanced Directives  Does Patient Have a Medical Advance Directive? Yes No No  Does patient want to make changes to medical advance directive? Yes (MAU/Ambulatory/Procedural Areas - Information given)    Would patient like information on creating a medical advance directive?  No - Patient declined     Current Medications (verified) Outpatient Encounter Medications as of 08/24/2023  Medication Sig   albuterol (VENTOLIN HFA) 108 (90 Base) MCG/ACT inhaler Inhale 2 puffs into the lungs every 6 (six) hours as needed for wheezing or shortness of breath.   aspirin EC 81 MG tablet Take 81 mg by mouth daily.   Bempedoic Acid (NEXLETOL) 180 MG TABS Take 1 tablet (180 mg total) by mouth daily.   clonazePAM (KLONOPIN) 1 MG tablet Take 1 mg by mouth 2 (two) times daily. 1/2 tablet by mouth up to twice daily as needed for anxiety or panic attacks.   diphenhydrAMINE HCl (BENADRYL ALLERGY PO) Take 2 tablets by mouth as needed.   docusate sodium (COLACE) 100 MG capsule Take 1 capsule (100 mg total) by mouth daily as needed.   Evolocumab (REPATHA SURECLICK) 140 MG/ML SOAJ Inject 140 mg into the skin every 14 (fourteen) days.   FLUoxetine (PROZAC) 20 MG capsule Take 20 mg by mouth every morning.   hydrochlorothiazide (HYDRODIURIL) 12.5 MG tablet Take 1 tablet (12.5 mg total) by mouth daily.   lubiprostone (AMITIZA) 8 MCG capsule TAKE 1 CAPSULE(8 MCG) BY MOUTH DAILY  WITH BREAKFAST   valsartan (DIOVAN) 160 MG tablet Take 1 tablet (160 mg total) by mouth daily.   zolpidem (AMBIEN) 10 MG tablet Take 10 mg by mouth at bedtime as needed for sleep.   No facility-administered encounter medications on file as of 08/24/2023.    Allergies (verified) Losartan, Pravastatin, Rosuvastatin, and Seroquel [quetiapine fumarate]   History: Past Medical History:  Diagnosis Date   Asthma    Chest pain of uncertain etiology 03/16/2021   Depression    MAJOR DEPRESSION, DR. KAUR;PSYCHIATRIST   HTN (hypertension) 07/06/2019   Hyperlipidemia    Hypokalemia 03/16/2021   Snoring 03/16/2021   Past Surgical History:  Procedure Laterality Date   BREAST BIOPSY     BREAST EXCISIONAL BIOPSY     BREAST SURGERY     REDUCTION MAMMAPLASTY Bilateral 1973   Family History  Problem Relation Age of Onset   Pancreatic cancer Mother    Cancer Mother    Hyperlipidemia Mother    Hypertension Mother    Varicose Veins Mother    Cancer Father    Deep vein thrombosis Father    Diabetes Father    Heart disease Father    Hyperlipidemia Father    Hypertension Father    Peripheral vascular disease Father        amputation   AAA (abdominal aortic aneurysm) Father    Hypertension  Sister    Hyperlipidemia Sister    Mental illness Sister    Hyperlipidemia Sister    Hypertension Sister    Breast cancer Sister        unsure of age   Breast cancer Sister        48s   Colon cancer Paternal Uncle    Breast cancer Cousin        unsure of age   Heart attack Brother    Hypertension Brother    Social History   Socioeconomic History   Marital status: Single    Spouse name: Not on file   Number of children: 1   Years of education: Not on file   Highest education level: Some college, no degree  Occupational History   Not on file  Tobacco Use   Smoking status: Never   Smokeless tobacco: Never  Vaping Use   Vaping status: Never Used  Substance and Sexual Activity   Alcohol use:  No    Alcohol/week: 0.0 standard drinks of alcohol   Drug use: No   Sexual activity: Not Currently  Other Topics Concern   Not on file  Social History Narrative   Not on file   Social Determinants of Health   Financial Resource Strain: Low Risk  (08/24/2023)   Overall Financial Resource Strain (CARDIA)    Difficulty of Paying Living Expenses: Not hard at all  Food Insecurity: No Food Insecurity (08/24/2023)   Hunger Vital Sign    Worried About Running Out of Food in the Last Year: Never true    Ran Out of Food in the Last Year: Never true  Transportation Needs: No Transportation Needs (08/24/2023)   PRAPARE - Administrator, Civil Service (Medical): No    Lack of Transportation (Non-Medical): No  Physical Activity: Insufficiently Active (08/24/2023)   Exercise Vital Sign    Days of Exercise per Week: 2 days    Minutes of Exercise per Session: 20 min  Stress: Stress Concern Present (08/24/2023)   [employer] of Occupational Health - Occupational Stress Questionnaire    Feeling of Stress : Rather much  Social Connections: Socially Isolated (08/24/2023)   Social Connection and Isolation Panel [NHANES]    Frequency of Communication with Friends and Family: More than three times a week    Frequency of Social Gatherings with Friends and Family: Never    Attends Religious Services: Never    Database administrator or Organizations: No    Attends Engineer, structural: Never    Marital Status: Divorced    Tobacco Counseling Counseling given: Not Answered   Clinical Intake:  Pre-visit preparation completed: Yes  Pain : No/denies pain     Nutritional Status: BMI > 30  Obese Nutritional Risks: None Diabetes: No  How often do you need to have someone help you when you read instructions, pamphlets, or other written materials from your doctor or pharmacy?: 1 - Never  Interpreter Needed?: No  Information entered by :: NAllen LPN   Activities of Daily  Living    08/24/2023    8:50 AM  In your present state of health, do you have any difficulty performing the following activities:  Hearing? 0  Vision? 0  Difficulty concentrating or making decisions? 0  Walking or climbing stairs? 0  Dressing or bathing? 0  Doing errands, shopping? 0  Preparing Food and eating ? N  Using the Toilet? N  In the past six months, have you accidently  leaked urine? N  Do you have problems with loss of bowel control? N  Managing your Medications? N  Managing your Finances? N  Housekeeping or managing your Housekeeping? N    Patient Care Team: Arnette Felts, FNP as PCP - General (General Practice) Lyn Records, MD (Inactive) as PCP - Cardiology (Cardiology)  Indicate any recent Medical Services you may have received from other than [facility] providers in the past year (date may be approximate).     Assessment:   This is a routine wellness examination for Jadi.  Hearing/Vision screen Hearing Screening - Comments:: Denies hearing issues Vision Screening - Comments:: Regular eye exams, [facility]  Dietary issues and exercise activities discussed:     Goals Addressed             This Visit's Progress    Patient Stated       08/24/2023, wants to lose weight and get BP down       Depression Screen    08/24/2023    8:57 AM 05/23/2023    8:37 AM 05/10/2023    8:56 AM 03/16/2021   10:10 AM  PHQ 2/9 Scores  PHQ - 2 Score 6 0 3 4  PHQ- 9 Score 21 2 5 10     Fall Risk    08/24/2023    8:56 AM 05/23/2023    8:37 AM 05/10/2023    8:56 AM 11/06/2019   10:53 AM  Fall Risk   Falls in the past year? 0 0 0 0  Number falls in past yr: 0 0 0 0  Injury with Fall? 0 0 0 0  Risk for fall due to : Medication side effect  No Fall Risks   Follow up Falls prevention discussed;Falls evaluation completed Falls evaluation completed Falls evaluation completed     MEDICARE RISK AT HOME: Medicare Risk at Home Any stairs in or around the home?: No If so, are  there any without handrails?: No Home free of loose throw rugs in walkways, pet beds, electrical cords, etc?: Yes Adequate lighting in your home to reduce risk of falls?: Yes Life alert?: No Use of a cane, walker or w/c?: No Grab bars in the bathroom?: No Shower chair or bench in shower?: No Elevated toilet seat or a handicapped toilet?: No  TIMED UP AND GO:  Was the test performed?  Yes  Length of time to ambulate 10 feet: 5 sec Gait steady and fast without use of assistive device    Cognitive Function:        08/24/2023    8:58 AM  6CIT Screen  What Year? 0 points  What month? 0 points  What time? 0 points  Count back from 20 0 points  Months in reverse 0 points  Repeat phrase 10 points  Total Score 10 points    Immunizations Immunization History  Administered Date(s) Administered   Tdap 05/23/2023    TDAP status: Up to date  Flu Vaccine status: Completed at today's visit  Pneumococcal vaccine status: Due, Education has been provided regarding the importance of this vaccine. Advised may receive this vaccine at local pharmacy or Health Dept. Aware to provide a copy of the vaccination record if obtained from local pharmacy or Health Dept. Verbalized acceptance and understanding.  Covid-19 vaccine status: Information provided on how to obtain vaccines.   Qualifies for Shingles Vaccine? Yes   Zostavax completed No   Shingrix Completed?: No.    Education has been provided regarding the importance  of this vaccine. Patient has been advised to call insurance company to determine out of pocket expense if they have not yet received this vaccine. Advised may also receive vaccine at local pharmacy or Health Dept. Verbalized acceptance and understanding.  Screening Tests Health Maintenance  Topic Date Due   COVID-19 Vaccine (1) Never done   Zoster Vaccines- Shingrix (1 of 2) Never done   DEXA SCAN  Never done   INFLUENZA VACCINE  07/21/2023   Pneumonia Vaccine 54+ Years old  (1 of 1 - PCV) 05/09/2024 (Originally 05/21/2018)   Hepatitis C Screening  05/09/2024 (Originally 05/22/1969)   Medicare Annual Wellness (AWV)  08/23/2024   MAMMOGRAM  01/19/2025   Colonoscopy  11/03/2031   DTaP/Tdap/Td (2 - Td or Tdap) 05/22/2033   HPV VACCINES  Aged Out    Health Maintenance  Health Maintenance Due  Topic Date Due   COVID-19 Vaccine (1) Never done   Zoster Vaccines- Shingrix (1 of 2) Never done   DEXA SCAN  Never done   INFLUENZA VACCINE  07/21/2023    Colorectal cancer screening: Type of screening: Colonoscopy. Completed 11/02/2021. Repeat every 5 years  Mammogram status: Completed 01/19/2023. Repeat every year  Bone Density status: scheduled for 10/07/2023  Lung Cancer Screening: (Low Dose CT Chest recommended if Age 53-80 years, 20 pack-year currently smoking OR have quit w/in 15years.) does not qualify.   Lung Cancer Screening Referral: no  Additional Screening:  Hepatitis C Screening: does not qualify;   Vision Screening: Recommended annual ophthalmology exams for early detection of glaucoma and other disorders of the eye. Is the patient up to date with their annual eye exam?  Yes  Who is the provider or what is the name of the office in which the patient attends annual eye exams? [facility] If pt is not established with a provider, would they like to be referred to a provider to establish care? No .   Dental Screening: Recommended annual dental exams for proper oral hygiene  Diabetic Foot Exam: n/a  Community Resource Referral / Chronic Care Management: CRR required this visit?  No   CCM required this visit?  No     Plan:     I have personally reviewed and noted the following in the patient's chart:   Medical and social history Use of alcohol, tobacco or illicit drugs  Current medications and supplements including opioid prescriptions. Patient is not currently taking opioid prescriptions. Functional ability and status Nutritional  status Physical activity Advanced directives List of other physicians Hospitalizations, surgeries, and ER visits in previous 12 months Vitals Screenings to include cognitive, depression, and falls Referrals and appointments  In addition, I have reviewed and discussed with patient certain preventive protocols, quality metrics, and best practice recommendations. A written personalized care plan for preventive services as well as general preventive health recommendations were provided to patient.     Barb Merino, LPN   08/24/2840   After Visit Summary: in person  Nurse Notes: none

## 2023-08-25 ENCOUNTER — Ambulatory Visit: Payer: Medicare Other

## 2023-08-31 ENCOUNTER — Telehealth: Payer: Self-pay

## 2023-08-31 NOTE — Telephone Encounter (Signed)
Called to confirm participation in next PREP class at [facility]; left voicemail requesting return call.

## 2023-09-01 ENCOUNTER — Ambulatory Visit (HOSPITAL_BASED_OUTPATIENT_CLINIC_OR_DEPARTMENT_OTHER): Payer: Medicare Other

## 2023-09-02 DIAGNOSIS — I5032 Chronic diastolic (congestive) heart failure: Secondary | ICD-10-CM | POA: Insufficient documentation

## 2023-09-13 ENCOUNTER — Telehealth: Payer: Self-pay

## 2023-09-13 ENCOUNTER — Ambulatory Visit (HOSPITAL_BASED_OUTPATIENT_CLINIC_OR_DEPARTMENT_OTHER): Payer: Medicare Other

## 2023-09-13 NOTE — Telephone Encounter (Signed)
Returned her call, she is back in town; would like to attend PREP on October 1, every T/Th at [facility] 10-11:15; assessment visit scheduled for 9/26 at 11am.

## 2023-09-14 ENCOUNTER — Telehealth: Payer: Self-pay | Admitting: Pharmacist

## 2023-09-14 NOTE — Progress Notes (Signed)
09/14/2023  Patient ID: Katie Potts, female   DOB: 1951/09/25, 72 y.o.   MRN: [id number]  Patient appearing on report for [employer] [facility] - Hypertension Control report due to last documented ambulatory blood pressure of 146/80 on 08/24/23. Next appointment with PCP is 11/24/23.   Outreached patient to discuss hypertension control and medication management.   Current antihypertensives: Hydrochlorothiazide 12.5mg , Valsartan 160mg   Patient has an automated upper arm home BP machine.  Current blood pressure readings: 138/80 is the lowest; was 147/60 when her brother checked it in [place]; systolic readings mostly in 140's to 150's at home   Patient denies hypotensive signs and symptoms including dizziness, lightheadedness.  Patient denies hypertensive symptoms including headache, chest pain, shortness of breath.  Patient denies side effects related to medications.     Assessment/Plan: - Currently uncontrolled - - Reviewed goal blood pressure <130/80 - Reviewed appropriate administration of medication regimen   **Of note, patient has had high stress due to sister having stroke recently and was in [place] for 1 month helping out. She just returned on Monday.  For PCP, patient may require dose increase of hydrochlorothiazide or adding another BP medication on for better control   Marlowe Aschoff, PharmD [facility] Phone Number: [phone]

## 2023-09-15 NOTE — Assessment & Plan Note (Signed)
Blood pressure is slightly elevated, repeat was elevated. Advised to focus on diet low in salt

## 2023-09-15 NOTE — Progress Notes (Signed)
[facility] PREP Evaluation  Patient Details  Name: Katie Potts MRN: [id number] Date of Birth: 1951-01-21 Age: 72 y.o. PCP: Arnette Felts, FNP  Vitals:   09/15/23 1109  BP: (!) 168/90  Pulse: 82  SpO2: 98%  Weight: 155 lb 6.4 oz (70.5 kg)     [facility] Eval - 09/15/23 1100       [facility] "PREP" Location   [facility] "PREP" Location [facility]      Referral    Referring Provider Duke Salvia    Reason for referral Hypertension;High Cholesterol;Inactivity    Program Start Date 09/20/23      Measurement   Waist Circumference 35 inches    Hip Circumference 42 inches    Body fat 30.3 percent      Information for Trainer   Goals --   lose 10-15 pounds by end of prgram; establish exercise routine, increase stamina; BP goal: 120/80 or less   Current Exercise --   stationary bike   Pertinent Medical History --   HTN, TIA, hyperglycemia     Timed Up and Go (TUGS)   Timed Up and Go Low risk <9 seconds      Mobility and Daily Activities   I find it easy to walk up or down two or more flights of stairs. 2    I have no trouble taking out the trash. 4    I do housework such as vacuuming and dusting on my own without difficulty. 4    I can easily lift a gallon of milk (8lbs). 4    I can easily walk a mile. 2    I have no trouble reaching into high cupboards or reaching down to pick up something from the floor. 2    I do not have trouble doing out-door work such as Loss adjuster, chartered, raking leaves, or gardening. 4      Mobility and Daily Activities   I feel younger than my age. 2    I feel independent. 4    I feel energetic. 2    I live an active life.  1    I feel strong. 4    I feel healthy. 4    I feel active as other people my age. 1      How fit and strong are you.   Fit and Strong Total Score 40            Past Medical History:  Diagnosis Date   Asthma    Chest pain of uncertain etiology 03/16/2021   Depression    MAJOR DEPRESSION, DR. KAUR;PSYCHIATRIST   HTN (hypertension)  07/06/2019   Hyperlipidemia    Hypokalemia 03/16/2021   Snoring 03/16/2021   Past Surgical History:  Procedure Laterality Date   BREAST BIOPSY     BREAST EXCISIONAL BIOPSY     BREAST SURGERY     REDUCTION MAMMAPLASTY Bilateral 1973   Social History   Tobacco Use  Smoking Status Never  Smokeless Tobacco Never  To begin PREP class at [facility] on October 1, every T/Th 10-11:15  Amina Menchaca B Radford Pease 09/15/2023, 11:12 AM

## 2023-09-15 NOTE — Assessment & Plan Note (Signed)
She is encouraged to strive for BMI less than 30 to decrease cardiac risk. Advised to aim for at least 150 minutes of exercise per week.  

## 2023-09-15 NOTE — Assessment & Plan Note (Signed)
Influenza vaccine administered Encouraged to take Tylenol as needed for fever or muscle aches.

## 2023-09-15 NOTE — Assessment & Plan Note (Signed)
Continue f/u with Cardiology.

## 2023-09-20 NOTE — Progress Notes (Signed)
[facility] PREP Weekly Session  Patient Details  Name: Katie Potts MRN: [id number] Date of Birth: November 03, 1951 Age: 72 y.o. PCP: Arnette Felts, FNP  There were no vitals filed for this visit.   [facility] Weekly seesion - 09/20/23 1100       [facility] "PREP" Location   [facility]PREP[facility] Location [facility]      Weekly Session   Topic Discussed Goal setting and welcome to the program   Introductions, review of PREP notebook, tour of facility; option to workout on cardio machine   Classes attended to date 1             Timmey Lamba B Velena Keegan 09/20/2023, 11:47 AM

## 2023-09-27 ENCOUNTER — Telehealth: Payer: Self-pay

## 2023-09-27 DIAGNOSIS — I5032 Chronic diastolic (congestive) heart failure: Secondary | ICD-10-CM
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
## 2023-09-30 ENCOUNTER — Telehealth: Payer: Self-pay | Admitting: *Deleted

## 2023-09-30 NOTE — Progress Notes (Signed)
  Care Coordination   Note   09/30/2023 Name: Katie Potts MRN: [id number] DOB: 07/28/1951  Katie Potts is a 72 y.o. year old female who sees Arnette Felts, FNP for primary care. I reached out to [name] by phone today to offer care coordination services.  Katie Potts was given information about Care Coordination services today including:   The Care Coordination services include support from the care team which includes your Nurse Coordinator, Clinical Social Worker, or Pharmacist.  The Care Coordination team is here to help remove barriers to the health concerns and goals most important to you. Care Coordination services are voluntary, and the patient may decline or stop services at any time by request to their care team member.   Care Coordination Consent Status: Patient agreed to services and verbal consent obtained.   Follow up plan:  Telephone appointment with care coordination team member scheduled for:  10/04/23  Encounter Outcome:  Patient Scheduled   [facility] Coordination Care Guide  Direct Dial: [phone]

## 2023-09-30 NOTE — Progress Notes (Signed)
  Care Coordination  Outreach Note  09/30/2023 Name: Arbutus Nelligan MRN: [id number] DOB: 07-05-1951   Care Coordination Outreach Attempts: An unsuccessful telephone outreach was attempted today to offer the patient information about available care coordination services.  Follow Up Plan:  Additional outreach attempts will be made to offer the patient care coordination information and services.   Encounter Outcome:  No Answer  Gwenevere Ghazi  Care Coordination Care Guide  Direct Dial: [phone]

## 2023-10-04 ENCOUNTER — Ambulatory Visit: Payer: Self-pay | Admitting: Licensed Clinical Social Worker

## 2023-10-05 NOTE — Patient Outreach (Signed)
  Care Coordination   Initial Visit Note   10/04/2023 Name: Katie Potts MRN: [id number] DOB: 12-19-1951  Katie Potts is a 72 y.o. year old female who sees Arnette Felts, FNP for primary care. I spoke with  Katie Potts by phone today.  What matters to the patients health and wellness today?  Grief, Transportation, Support Resources    Goals Addressed             This Visit's Progress    Obtain Supportive Resources-Management of MH Conditions/Grief Support   On track    Activities and task to complete in order to accomplish goals.   Keep all upcoming appointments discussed today Continue with compliance of taking medication prescribed by Doctor Implement healthy coping skills discussed to assist with management of symptoms         SDOH assessments and interventions completed:  No     Care Coordination Interventions:  Yes, provided  Interventions Today    Flowsheet Row Most Recent Value  Chronic Disease   Chronic disease during today's visit Hypertension (HTN)  General Interventions   General Interventions Discussed/Reviewed General Interventions Discussed, [facility], Doctor Visits, Communication with  [facility]	[place] resources discussed]  Doctor Visits Discussed/Reviewed Doctor Visits Discussed  Communication with --  [employer] collaborated with care guide to schedule initial with RNCM to strenghten support system in managing chronic health conditions]  Exercise Interventions   Exercise Discussed/Reviewed Exercise Discussed, Physical Activity  Physical Activity Discussed/Reviewed PREP  [Patient reports that she will contact PREP coordinator to re-initate classes to assist in management of HTN and promote socialization]  Mental Health Interventions   Mental Health Discussed/Reviewed Mental Health Discussed, Coping Strategies, Anxiety, Depression, Grief and Loss  [Pt has hx of extensive complicated grief, trauma, and current psychosocial stressors that  negatively impact mental health symptoms. Validation and encouragement provided. Pt has initiated med management and filled prescribed meds.]  Nutrition Interventions   Nutrition Discussed/Reviewed Nutrition Discussed  Pharmacy Interventions   Pharmacy Dicussed/Reviewed Pharmacy Topics Discussed, Medication Adherence  Safety Interventions   Safety Discussed/Reviewed Safety Discussed       Follow up plan: Follow up call scheduled for 2-4 weeks    Encounter Outcome:  Patient Visit Completed   [name], MSW, LCSW [facility] Care Management [facility]  [facility] [email or web address] Phone [phone] 6:38 PM

## 2023-10-05 NOTE — Patient Instructions (Signed)
Visit Information  Thank you for taking time to visit with me today. Please don't hesitate to contact me if I can be of assistance to you.   Following are the goals we discussed today:   Goals Addressed             This Visit's Progress    Obtain Supportive Resources-Management of MH Conditions/Grief Support   On track    Activities and task to complete in order to accomplish goals.   Keep all upcoming appointments discussed today Continue with compliance of taking medication prescribed by Doctor Implement healthy coping skills discussed to assist with management of symptoms         Our next appointment is by telephone on 10/29 at 11:30 AM  Please call the care guide team at [phone] if you need to cancel or reschedule your appointment.   If you are experiencing a Mental Health or Behavioral Health Crisis or need someone to talk to, please call the Suicide and Crisis Lifeline: 988 call 911   The patient verbalized understanding of instructions, educational materials, and care plan provided today and DECLINED offer to receive copy of patient instructions, educational materials, and care plan.   [name], MSW, LCSW [facility] Care Management [place]  [facility] [email or web address] Phone [phone] 6:39 PM

## 2023-10-07 ENCOUNTER — Inpatient Hospital Stay: Admission: RE | Admit: 2023-10-07 | Payer: Medicare Other | Source: Ambulatory Visit

## 2023-10-07 ENCOUNTER — Other Ambulatory Visit: Payer: Self-pay | Admitting: Family Medicine

## 2023-10-07 ENCOUNTER — Encounter: Payer: Self-pay | Admitting: Family Medicine

## 2023-10-07 DIAGNOSIS — E2839 Other primary ovarian failure: Secondary | ICD-10-CM

## 2023-10-10 NOTE — Progress Notes (Unsigned)
10/11/2023 Katie Potts October 29, 1951 [id number]   HPI:  Katie Potts is a 72 y.o. female patient of Dr Duke Salvia, with a PMH below who presents today for hypertension management.  She had previously been seen in the [facility], but now is following up with Dr. Duke Salvia as her primary cardiologist, as Dr. Katrinka Blazing has retired.  When she was seen in August, her primary concern was dizziness and nausea that would come on without warning.  Occurred once while washing dishes, had to lie down on the floor and was unable to get to her bedroom.  She also reported chronic fatigue and intermittent racing palpitations.  Home BP readings were 170 or higher systolic consistently.    Today she returns for follow up.  Katie Potts, as her sister is being transferred from one skilled nursing facility to a more long term center.  She had a hemorrhagic stroke and is unable to care for herself.  She doesn't have full understanding and believes that she is going home today.  Katie Potts.  She had wanted to be in [place] with the sister, but was unable.   She has been going back and forth to [place] by train regularly, helping with the sister's care.  She does note that her nausea is almost completely gone and the dizziness much less frequent.  Notices the palpitations more on stressful days.    Past Medical History: ASCVD CC score 73 (80th percentile), prior TIA  hyperlipidemia LDL at 124 (10/21) on Repatha, Nexletol  Diastolic HF chronic     Blood Pressure Goal:  130/80  Current Medications: valsartan 160 mg every Potts, hctz 12.5 mg every Potts (both in am)  Family Hx: mother died at 37 cancer, dad at 51 stroke, both with hypertension, 4 siblings, brother died form MI at 19, others all with hypertension, 1 son, now deceased,   Social Hx: no tobacco, occasional alcohol, drinks occasional decaf  coffee  Diet: tries to eat home cooke, but travelling a lot to sister in [place], plenty of chicken (rotissire all week with vegetables (frozen), occasional rice;  doesn't snack much   Exercise: not currently  Home BP readings: no home readings with her today  Intolerances: amlodipine -headaches, edema; now tolerating; losartan and olmesartan didn't work, hctz may have caused hypokalemia  Labs: 4/22:  Na 141, K 4.0, Glu 94, BUN 12, SCr 0.95, GFR 66   Wt Readings from Last 3 Encounters:  09/15/23 155 lb 6.4 oz (70.5 kg)  08/24/23 159 lb (72.1 kg)  08/24/23 159 lb 6.4 oz (72.3 kg)   BP Readings from Last 3 Encounters:  10/11/23 (!) 183/79  09/15/23 (!) 168/90  09/14/23 138/80   Pulse Readings from Last 3 Encounters:  10/11/23 (!) 58  09/15/23 82  08/24/23 60    Current Outpatient Medications  Medication Sig Dispense Refill   albuterol (VENTOLIN HFA) 108 (90 Base) MCG/ACT inhaler Inhale 2 puffs into the lungs every 6 (six) hours as needed for wheezing or shortness of breath. 18 g 3   aspirin EC 81 MG tablet Take 81 mg by mouth daily.     Bempedoic Acid (NEXLETOL) 180 MG TABS Take 1 tablet (180 mg total) by mouth daily. 30 tablet 11   clonazePAM (KLONOPIN) 1 MG tablet Take 1 mg by mouth 2 (two) times daily. 1/2 tablet by mouth up to  twice daily as needed for anxiety or panic attacks.     diphenhydrAMINE HCl (BENADRYL ALLERGY PO) Take 2 tablets by mouth as needed.     docusate sodium (COLACE) 100 MG capsule Take 1 capsule (100 mg total) by mouth daily as needed. 30 capsule 1   Evolocumab (REPATHA SURECLICK) 140 MG/ML SOAJ Inject 140 mg into the skin every 14 (fourteen) days. 2 mL 12   FLUoxetine (PROZAC) 20 MG capsule Take 40 mg by mouth every morning.     hydrochlorothiazide (HYDRODIURIL) 12.5 MG tablet Take 1 tablet (12.5 mg total) by mouth daily.     lubiprostone (AMITIZA) 8 MCG capsule TAKE 1 CAPSULE(8 MCG) BY MOUTH DAILY WITH BREAKFAST 30 capsule 2   valsartan (DIOVAN) 160 MG  tablet Take 1 tablet (160 mg total) by mouth daily. 90 tablet 3   zolpidem (AMBIEN) 10 MG tablet Take 10 mg by mouth at bedtime as needed for sleep.     No current facility-administered medications for this visit.    Allergies  Allergen Reactions   Losartan     myalgias   Pravastatin     myalgias   Rosuvastatin     myalgias   Seroquel [Quetiapine Fumarate] Swelling    Past Medical History:  Diagnosis Date   Asthma    Chest pain of uncertain etiology 03/16/2021   Depression    MAJOR DEPRESSION, DR. KAUR;PSYCHIATRIST   HTN (hypertension) 07/06/2019   Hyperlipidemia    Hypokalemia 03/16/2021   Snoring 03/16/2021    Blood pressure (!) 183/79, pulse (!) 58.  HTN (hypertension) Assessment: BP is un/controlled in office BP 183/79 mmHg;  above the goal (<130/80). Very stressed in office 2/2 sister situation Tolerates valsartan and hctz well without any side effects Denies SOB, palpitation, chest pain, headaches,or swelling Reiterated the importance of regular exercise and low salt diet   Plan:  Continue taking valsartan 160 mg every Potts, hctz 12.5 mg every Potts  Patient to keep record of BP readings with heart rate and report to [place] at the next visit Patient to follow up with PharmD in 2 months  Labs ordered today: none    Phillips Hay PharmD CPP [facility] [address] [place], [place] [address]

## 2023-10-11 ENCOUNTER — Encounter (HOSPITAL_BASED_OUTPATIENT_CLINIC_OR_DEPARTMENT_OTHER): Payer: Self-pay | Admitting: Pharmacist Clinician (PhC)/ Clinical Pharmacy Specialist

## 2023-10-11 ENCOUNTER — Ambulatory Visit (HOSPITAL_BASED_OUTPATIENT_CLINIC_OR_DEPARTMENT_OTHER): Payer: Medicare Other | Admitting: Pharmacist Clinician (PhC)/ Clinical Pharmacy Specialist

## 2023-10-11 VITALS — BP 183/79 | HR 58

## 2023-10-11 DIAGNOSIS — I10 Essential (primary) hypertension: Secondary | ICD-10-CM | POA: Diagnosis not present

## 2023-10-11 NOTE — Assessment & Plan Note (Signed)
Assessment: BP is un/controlled in office BP 183/79 mmHg;  above the goal (<130/80). Very stressed in office 2/2 sister situation Tolerates valsartan and hctz well without any side effects Denies SOB, palpitation, chest pain, headaches,or swelling Reiterated the importance of regular exercise and low salt diet   Plan:  Continue taking valsartan 160 mg every day, hctz 12.5 mg every day  Patient to keep record of BP readings with heart rate and report to [place] at the next visit Patient to follow up with PharmD in 2 months  Labs ordered today: none

## 2023-10-11 NOTE — Patient Instructions (Signed)
Follow up appointment: in 2 months - I will reach out to you for that appointment  Take your BP meds as follows: no changes to medications today  Check your blood pressure at home 3-4 times per week when at home and keep record of the readings.  Hypertension "High blood pressure"  Hypertension is often called "The Silent Killer." It rarely causes symptoms until it is extremely  high or has done damage to other organs in the body. For this reason, you should have your  blood pressure checked regularly by your physician. We will check your blood pressure  every time you see a provider at one of our offices.   Your blood pressure reading consists of two numbers. Ideally, blood pressure should be  below 120/80. The first ("top") number is called the systolic pressure. It measures the  pressure in your arteries as your heart beats. The second ("bottom") number is called the diastolic pressure. It measures the pressure in your arteries as the heart relaxes between beats.  The benefits of getting your blood pressure under control are enormous. A 10-point  reduction in systolic blood pressure can reduce your risk of stroke by 27% and heart failure by 28%  Your blood pressure goal is < 130/80  To check your pressure at home you will need to:  1. Sit up in a chair, with feet flat on the floor and back supported. Do not cross your ankles or legs. 2. Rest your left arm so that the cuff is about heart level. If the cuff goes on your upper arm,  then just relax the arm on the table, arm of the chair or your lap. If you have a wrist cuff, we  suggest relaxing your wrist against your chest (think of it as Pledging the Flag with the  wrong arm).  3. Place the cuff snugly around your arm, about 1 inch above the crook of your elbow. The  cords should be inside the groove of your elbow.  4. Sit quietly, with the cuff in place, for about 5 minutes. After that 5 minutes press the power  button to start  a reading. 5. Do not talk or move while the reading is taking place.  6. Record your readings on a sheet of paper. Although most cuffs have a memory, it is often  easier to see a pattern developing when the numbers are all in front of you.  7. You can repeat the reading after 1-3 minutes if it is recommended  Make sure your bladder is empty and you have not had caffeine or tobacco within the last 30 min  Always bring your blood pressure log with you to your appointments. If you have not brought your monitor in to be double checked for accuracy, please bring it to your next appointment.  You can find a list of quality blood pressure cuffs at validatebp.org

## 2023-10-14 ENCOUNTER — Ambulatory Visit: Payer: Self-pay

## 2023-10-14 NOTE — Patient Outreach (Signed)
  Care Coordination   10/14/2023 Name: Katie Potts MRN: [id number] DOB: [birth year]   Care Coordination Outreach Attempts:  An unsuccessful telephone outreach was attempted for a scheduled appointment today.  Follow Up Plan:  Additional outreach attempts will be made to offer the patient care coordination information and services.   Encounter Outcome:  No Answer   Care Coordination Interventions:  No, not indicated    Delsa Sale RN BSN CCM   [facility] Nurse Care Coordinator  Direct Dial: [phone] Website: [email or web address]

## 2023-10-18 ENCOUNTER — Ambulatory Visit: Payer: Self-pay | Admitting: Licensed Clinical Social Worker

## 2023-10-19 NOTE — Patient Instructions (Signed)
Visit Information  Thank you for taking time to visit with me today. Please don't hesitate to contact me if I can be of assistance to you.   Following are the goals we discussed today:   Goals Addressed             This Visit's Progress    Obtain Supportive Resources-Management of MH Conditions/Grief Support   On track    Activities and task to complete in order to accomplish goals.   Keep all upcoming appointments discussed today Continue with compliance of taking medication prescribed by Doctor Implement healthy coping skills discussed to assist with management of symptoms         Our next appointment is by telephone on 11/19 at 11 AM  Please call the care guide team at [phone] if you need to cancel or reschedule your appointment.   If you are experiencing a Mental Health or Behavioral Health Crisis or need someone to talk to, please call the Suicide and Crisis Lifeline: 988 call 911   The patient verbalized understanding of instructions, educational materials, and care plan provided today and DECLINED offer to receive copy of patient instructions, educational materials, and care plan.   [name], MSW, LCSW [facility] Care Management [place]  [facility] [email or web address] Phone [phone] 12:09 PM

## 2023-10-19 NOTE — Patient Outreach (Signed)
  Care Coordination   Follow Up Visit Note   10/18/2023 Name: Katie Potts MRN: [id number] DOB: 10-Jan-1951  Katie Potts is a 72 y.o. year old female who sees Arnette Felts, FNP for primary care. I spoke with  Katie Potts by phone today.  What matters to the patients health and wellness today?  Symptom Management    Goals Addressed             This Visit's Progress    Obtain Supportive Resources-Management of MH Conditions/Grief Support   On track    Activities and task to complete in order to accomplish goals.   Keep all upcoming appointments discussed today Continue with compliance of taking medication prescribed by Doctor Implement healthy coping skills discussed to assist with management of symptoms         SDOH assessments and interventions completed:  No     Care Coordination Interventions:  Yes, provided  Interventions Today    Flowsheet Row Most Recent Value  Chronic Disease   Chronic disease during today's visit Hypertension (HTN)  [Patient reports goal to assist with decreasing blood pressure. Checks 3 x weekly and utilizes log sheet to track for PCP]  General Interventions   General Interventions Discussed/Reviewed General Interventions Reviewed, Doctor Visits, Community Resources  Doctor Visits Discussed/Reviewed Doctor Visits Reviewed  Mental Health Interventions   Mental Health Discussed/Reviewed Mental Health Reviewed, Coping Strategies, Grief and Loss, Depression, Anxiety  [Pt endorses decrease in depression with adjusted meds. Continues to have difficulty sleeping and increased appetite. Encouragement, healthy coping skills, and self-care strategies discussed]  Nutrition Interventions   Nutrition Discussed/Reviewed Nutrition Reviewed  Pharmacy Interventions   Pharmacy Dicussed/Reviewed Pharmacy Topics Reviewed, Medication Adherence  Safety Interventions   Safety Discussed/Reviewed Safety Reviewed       Follow up plan: Follow up call  scheduled for 4-6 weeks    Encounter Outcome:  Patient Visit Completed   [name], MSW, LCSW [facility] Care Management [facility]  [facility] [email or web address] Phone [phone] 12:08 PM

## 2023-10-26 ENCOUNTER — Other Ambulatory Visit: Payer: Self-pay | Admitting: Cardiology

## 2023-11-04 ENCOUNTER — Telehealth: Payer: Self-pay | Admitting: Cardiovascular Disease

## 2023-11-04 NOTE — Telephone Encounter (Signed)
Spoke with patient regarding Repatha She had not been paying all year for medications  She also need follow up with Belenda Cruise, prefers [facility] location  Will forward to [place] for review

## 2023-11-04 NOTE — Telephone Encounter (Signed)
Pt c/o medication issue:  1. Name of Medication:   Evolocumab (REPATHA SURECLICK) 140 MG/ML SOAJ    2. How are you currently taking this medication (dosage and times per day)?   3. Are you having a reaction (difficulty breathing--STAT)?   4. What is your medication issue? Patient states that this medication was going to cost over $100 and she would like a call back to discuss other options or assistance. Please advise.

## 2023-11-07 NOTE — Telephone Encounter (Signed)
[employer] grant renewed  ID          [id number] BIN        [id number] PCN      [id number] GRP      [id number]  Exp  10/19/24  Gave information to patient, she will forward to pharmacy

## 2023-11-08 ENCOUNTER — Encounter: Payer: Self-pay | Admitting: Licensed Clinical Social Worker

## 2023-11-09 ENCOUNTER — Encounter: Payer: Self-pay | Admitting: Licensed Clinical Social Worker

## 2023-11-09 NOTE — Patient Outreach (Signed)
  Care Coordination   Follow Up Visit Note   11/09/2023 Name: Katie Potts MRN: [id number] DOB: 08-02-1951  Katie Potts is a 72 y.o. year old female who sees Arnette Felts, FNP for primary care.   What matters to the patients health and wellness today?  Transportation    Goals Addressed             This Visit's Progress    Obtain Supportive Resources-Management of MH Conditions/Grief Support   On track    Activities and task to complete in order to accomplish goals.   Keep all upcoming appointments discussed today Continue with compliance of taking medication prescribed by Doctor Implement healthy coping skills discussed to assist with management of symptoms Review and complete [facility] [place] application mailed to residence         SDOH assessments and interventions completed:  No     Care Coordination Interventions:  Yes, provided  Interventions Today    Flowsheet Row Most Recent Value  General Interventions   General Interventions Discussed/Reviewed [employer], Communication with  [facility] Care Guide, pt has not received [facility] [place] application. LCSW has completed letter with application attached]  Communication with --  [Care Guide]       Follow up plan: Follow up call scheduled for 1-2 weeks    Encounter Outcome:  Patient Visit Completed   [name], MSW, LCSW [facility] Care Management [facility]  [facility] [email or web address] Phone [phone] 8:41 AM

## 2023-11-16 ENCOUNTER — Ambulatory Visit: Payer: Self-pay | Admitting: Licensed Clinical Social Worker

## 2023-11-16 NOTE — Patient Instructions (Signed)
Visit Information  Thank you for taking time to visit with me today. Please don't hesitate to contact me if I can be of assistance to you.   Following are the goals we discussed today:   Goals Addressed             This Visit's Progress    Obtain Supportive Resources-Management of MH Conditions/Grief Support   On track    Activities and task to complete in order to accomplish goals.   Keep all upcoming appointments discussed today Continue with compliance of taking medication prescribed by Doctor Implement healthy coping skills discussed to assist with management of symptoms Review and complete [facility] [place] application mailed to residence         Our next appointment is by telephone on 12/23 at 10 AM  Please call the care guide team at [phone] if you need to cancel or reschedule your appointment.   If you are experiencing a Mental Health or Behavioral Health Crisis or need someone to talk to, please call the Suicide and Crisis Lifeline: 988 call 911   The patient verbalized understanding of instructions, educational materials, and care plan provided today and DECLINED offer to receive copy of patient instructions, educational materials, and care plan.   [name], MSW, LCSW [facility] Care Management   [facility] [email or web address] Phone [phone] 3:37 PM

## 2023-11-16 NOTE — Patient Outreach (Signed)
  Care Coordination   Follow Up Visit Note   11/16/2023 Name: Katie Potts MRN: [id number] DOB: Jul 21, 1951  Katie Potts is a 72 y.o. year old female who sees Arnette Felts, FNP for primary care. I spoke with  Katie Potts by phone today.  What matters to the patients health and wellness today?  Symptom Management and Grief    Goals Addressed             This Visit's Progress    Obtain Supportive Resources-Management of MH Conditions/Grief Support   On track    Activities and task to complete in order to accomplish goals.   Keep all upcoming appointments discussed today Continue with compliance of taking medication prescribed by Doctor Implement healthy coping skills discussed to assist with management of symptoms Review and complete [facility] [place] application mailed to residence         SDOH assessments and interventions completed:  No     Care Coordination Interventions:  Yes, provided  Interventions Today    Flowsheet Row Most Recent Value  Chronic Disease   Chronic disease during today's visit Hypertension (HTN)  General Interventions   General Interventions Discussed/Reviewed General Interventions Reviewed, [facility], Doctor Visits  Doctor Visits Discussed/Reviewed Doctor Visits Reviewed  Mental Health Interventions   Mental Health Discussed/Reviewed Mental Health Reviewed, Coping Strategies, Anxiety, Depression, Grief and Loss       Follow up plan: Follow up call scheduled for 4-6 weeks    Encounter Outcome:  Patient Visit Completed   [name], MSW, LCSW [facility] Care Management [facility]  [facility] [email or web address] Phone [phone] 3:36 PM

## 2023-11-23 ENCOUNTER — Ambulatory Visit: Payer: Self-pay

## 2023-11-24 ENCOUNTER — Ambulatory Visit: Payer: Medicare Other | Admitting: Nurse Practitioner

## 2023-11-24 NOTE — Patient Outreach (Signed)
  Care Coordination   Initial Visit Note   11/23/2023 Name: Katie Potts MRN: [id number] DOB: [birth year]  Katie Potts is a 72 y.o. year old female who sees Arnette Felts, FNP for primary care. I spoke with  Katie Potts by phone today.  What matters to the patients health and wellness today?   Patient would like to better manage her high blood pressure.      Goals Addressed             This Visit's Progress    To better manage high blood pressure       Care Coordination Interventions: Evaluation of current treatment plan related to hypertension self management and patient's adherence to plan as established by provider Provided education to patient re: stroke prevention, s/s of heart attack and stroke Reviewed medications with patient and discussed importance of compliance Advised patient, providing education and rationale, to monitor blood pressure daily and record, calling PCP for findings outside established parameters Provided education on prescribed diet low Sodium Discussed complications of poorly controlled blood pressure such as heart disease, stroke, circulatory complications, vision complications, kidney impairment, sexual dysfunction Screening for signs and symptoms of depression related to chronic disease state  Assessed social determinant of health barriers Active listening / Reflection utilized  Emotional Support Provided  Mailed printed educational materials related to What is High Blood Pressure?; Why Should I Limit Sodium?; How to Accurately Measure BP at Home; DASH diet; BP log    Interventions Today    Flowsheet Row Most Recent Value  Chronic Disease   Chronic disease during today's visit Hypertension (HTN)  General Interventions   General Interventions Discussed/Reviewed General Interventions Discussed, General Interventions Reviewed, Doctor Visits, Durable Medical Equipment (DME)  Doctor Visits Discussed/Reviewed Doctor Visits Reviewed, Doctor Visits  Discussed, Specialist, PCP  Durable Medical Equipment (DME) BP Cuff  Exercise Interventions   Exercise Discussed/Reviewed Physical Activity, Exercise Reviewed, Exercise Discussed  Physical Activity Discussed/Reviewed Physical Activity Discussed, Physical Activity Reviewed  Education Interventions   Education Provided Provided Education, Provided Printed Education  Provided Verbal Education On When to see the doctor, Medication, Nutrition, Mental Health/Coping with Illness  Mental Health Interventions   Mental Health Discussed/Reviewed Mental Health Discussed, Mental Health Reviewed, Depression, Anxiety, Grief and Loss, Coping Strategies  Nutrition Interventions   Nutrition Discussed/Reviewed Nutrition Discussed, Nutrition Reviewed, Fluid intake  Pharmacy Interventions   Pharmacy Dicussed/Reviewed Pharmacy Topics Discussed, Pharmacy Topics Reviewed, Medications and their functions, Medication Adherence, Affording Medications          SDOH assessments and interventions completed:  Yes  SDOH Interventions Today    Flowsheet Row Most Recent Value  SDOH Interventions   Physical Activity Interventions Other (Comments)  [Educated patient about exercise recommendations per ADA]        Care Coordination Interventions:  Yes, provided   Follow up plan: Follow up call scheduled for 12/15/23 @2 :30 PM    Encounter Outcome:  Patient Visit Completed

## 2023-11-24 NOTE — Patient Instructions (Signed)
Visit Information  Thank you for taking time to visit with me today. Please don't hesitate to contact me if I can be of assistance to you.   Following are the goals we discussed today:   Goals Addressed             This Visit's Progress    To better manage high blood pressure       Care Coordination Interventions: Evaluation of current treatment plan related to hypertension self management and patient's adherence to plan as established by provider Provided education to patient re: stroke prevention, s/s of heart attack and stroke Reviewed medications with patient and discussed importance of compliance Advised patient, providing education and rationale, to monitor blood pressure daily and record, calling PCP for findings outside established parameters Provided education on prescribed diet low Sodium Discussed complications of poorly controlled blood pressure such as heart disease, stroke, circulatory complications, vision complications, kidney impairment, sexual dysfunction Screening for signs and symptoms of depression related to chronic disease state  Assessed social determinant of health barriers Active listening / Reflection utilized  Emotional Support Provided  Mailed printed educational materials related to What is High Blood Pressure?; Why Should I Limit Sodium?; How to Accurately Measure BP at Home; DASH diet; BP log        Our next appointment is by telephone on 12/15/23 at 2:30 PM  Please call the care guide team at [phone] if you need to cancel or reschedule your appointment.   If you are experiencing a Mental Health or Behavioral Health Crisis or need someone to talk to, please call 1-800-273-TALK (toll free, 24 hour hotline)  The patient verbalized understanding of instructions, educational materials, and care plan provided today and DECLINED offer to receive copy of patient instructions, educational materials, and care plan.   Delsa Sale RN BSN CCM [facility]  [facility], [facility] Nurse Care Coordinator  Direct Dial: [phone] Website: [email or web address]

## 2023-11-24 NOTE — Progress Notes (Unsigned)
Madelaine Bhat, CMA,acting as a Neurosurgeon for Arnette Felts, FNP.,have documented all relevant documentation on the behalf of Arnette Felts, FNP,as directed by  Arnette Felts, FNP while in the presence of Arnette Felts, FNP.  Subjective:  Patient ID: Katie Potts , female    DOB: [birth year] , 72 y.o.   MRN: [id number]  No chief complaint on file.   HPI  Patient presents today for a bp and chol follow up, Patient reports compliance with medication. Patient denies any chest pain, SOB, or headaches. Patient has no concerns today.     Past Medical History:  Diagnosis Date  . Asthma   . Chest pain of uncertain etiology 03/16/2021  . Depression    MAJOR DEPRESSION, DR. KAUR;PSYCHIATRIST  . HTN (hypertension) 07/06/2019  . Hyperlipidemia   . Hypokalemia 03/16/2021  . Snoring 03/16/2021     Family History  Problem Relation Age of Onset  . Pancreatic cancer Mother   . Cancer Mother   . Hyperlipidemia Mother   . Hypertension Mother   . Varicose Veins Mother   . Cancer Father   . Deep vein thrombosis Father   . Diabetes Father   . Heart disease Father   . Hyperlipidemia Father   . Hypertension Father   . Peripheral vascular disease Father        amputation  . AAA (abdominal aortic aneurysm) Father   . Hypertension Sister   . Hyperlipidemia Sister   . Mental illness Sister   . Hyperlipidemia Sister   . Hypertension Sister   . Breast cancer Sister        unsure of age  . Breast cancer Sister        33s  . Colon cancer Paternal Uncle   . Breast cancer Cousin        unsure of age  . Heart attack Brother   . Hypertension Brother      Current Outpatient Medications:  .  albuterol (VENTOLIN HFA) 108 (90 Base) MCG/ACT inhaler, Inhale 2 puffs into the lungs every 6 (six) hours as needed for wheezing or shortness of breath., Disp: 18 g, Rfl: 3 .  aspirin EC 81 MG tablet, Take 81 mg by mouth daily., Disp: , Rfl:  .  Bempedoic Acid (NEXLETOL) 180 MG TABS, Take 1 tablet (180 mg total) by  mouth daily., Disp: 30 tablet, Rfl: 11 .  clonazePAM (KLONOPIN) 1 MG tablet, Take 1 mg by mouth 2 (two) times daily. 1/2 tablet by mouth up to twice daily as needed for anxiety or panic attacks., Disp: , Rfl:  .  diphenhydrAMINE HCl (BENADRYL ALLERGY PO), Take 2 tablets by mouth as needed., Disp: , Rfl:  .  docusate sodium (COLACE) 100 MG capsule, Take 1 capsule (100 mg total) by mouth daily as needed., Disp: 30 capsule, Rfl: 1 .  Evolocumab (REPATHA SURECLICK) 140 MG/ML SOAJ, ADMINISTER 1 ML UNDER THE SKIN EVERY 14 DAYS, Disp: 2 mL, Rfl: 5 .  FLUoxetine (PROZAC) 20 MG capsule, Take 40 mg by mouth every morning., Disp: , Rfl:  .  hydrochlorothiazide (HYDRODIURIL) 12.5 MG tablet, Take 1 tablet (12.5 mg total) by mouth daily., Disp: , Rfl:  .  lubiprostone (AMITIZA) 8 MCG capsule, TAKE 1 CAPSULE(8 MCG) BY MOUTH DAILY WITH BREAKFAST, Disp: 30 capsule, Rfl: 2 .  valsartan (DIOVAN) 160 MG tablet, Take 1 tablet (160 mg total) by mouth daily., Disp: 90 tablet, Rfl: 3 .  zolpidem (AMBIEN) 10 MG tablet, Take 10 mg by mouth at bedtime as  needed for sleep., Disp: , Rfl:    Allergies  Allergen Reactions  . Losartan     myalgias  . Pravastatin     myalgias  . Rosuvastatin     myalgias  . Seroquel [Quetiapine Fumarate] Swelling     Review of Systems  Constitutional: Negative.   HENT: Negative.    Eyes: Negative.   Respiratory: Negative.    Cardiovascular: Negative.   Gastrointestinal: Negative.     There were no vitals filed for this visit. There is no height or weight on file to calculate BMI.  Wt Readings from Last 3 Encounters:  09/15/23 155 lb 6.4 oz (70.5 kg)  08/24/23 159 lb (72.1 kg)  08/24/23 159 lb 6.4 oz (72.3 kg)    The 10-year ASCVD risk score (Arnett DK, et al., 2019) is: 17.4%   Values used to calculate the score:     Age: 32 years     Sex: Female     Is Non-Hispanic African American: Yes     Diabetic: No     Tobacco smoker: No     Systolic Blood Pressure: 183 mmHg     Is  BP treated: Yes     HDL Cholesterol: 59 mg/dL     Total Cholesterol: 145 mg/dL  Objective:  Physical Exam      Assessment And Plan:  Malignant essential hypertension with congestive heart failure (HCC)  Mixed hyperlipidemia    No follow-ups on file.  Patient was given opportunity to ask questions. Patient verbalized understanding of the plan and was able to repeat key elements of the plan. All questions were answered to their satisfaction.    Jeanell Sparrow, FNP, have reviewed all documentation for this visit. The documentation on 11/24/23 for the exam, diagnosis, procedures, and orders are all accurate and complete.   IF YOU HAVE BEEN REFERRED TO A SPECIALIST, IT MAY TAKE 1-2 WEEKS TO SCHEDULE/PROCESS THE REFERRAL. IF YOU HAVE NOT HEARD FROM US/SPECIALIST IN TWO WEEKS, PLEASE GIVE [place] A CALL AT [phone] X [phone].

## 2023-11-30 ENCOUNTER — Encounter: Payer: Self-pay | Admitting: Nurse Practitioner

## 2023-11-30 ENCOUNTER — Ambulatory Visit: Payer: Medicare Other | Admitting: Nurse Practitioner

## 2023-11-30 VITALS — BP 130/90 | HR 75 | Temp 97.8°F | Ht 61.0 in | Wt 160.2 lb

## 2023-11-30 DIAGNOSIS — R202 Paresthesia of skin: Secondary | ICD-10-CM | POA: Diagnosis not present

## 2023-11-30 DIAGNOSIS — I70209 Unspecified atherosclerosis of native arteries of extremities, unspecified extremity: Secondary | ICD-10-CM | POA: Diagnosis not present

## 2023-11-30 DIAGNOSIS — N182 Chronic kidney disease, stage 2 (mild): Secondary | ICD-10-CM

## 2023-11-30 DIAGNOSIS — Z2821 Immunization not carried out because of patient refusal: Secondary | ICD-10-CM

## 2023-11-30 DIAGNOSIS — Z6830 Body mass index (BMI) 30.0-30.9, adult: Secondary | ICD-10-CM

## 2023-11-30 DIAGNOSIS — I130 Hypertensive heart and chronic kidney disease with heart failure and stage 1 through stage 4 chronic kidney disease, or unspecified chronic kidney disease: Secondary | ICD-10-CM

## 2023-11-30 DIAGNOSIS — E6609 Other obesity due to excess calories: Secondary | ICD-10-CM

## 2023-11-30 DIAGNOSIS — K5904 Chronic idiopathic constipation: Secondary | ICD-10-CM | POA: Diagnosis not present

## 2023-11-30 DIAGNOSIS — R631 Polydipsia: Secondary | ICD-10-CM

## 2023-11-30 DIAGNOSIS — Z8659 Personal history of other mental and behavioral disorders: Secondary | ICD-10-CM

## 2023-11-30 DIAGNOSIS — E782 Mixed hyperlipidemia: Secondary | ICD-10-CM | POA: Diagnosis not present

## 2023-11-30 DIAGNOSIS — E66811 Obesity, class 1: Secondary | ICD-10-CM

## 2023-11-30 MED ORDER — LUBIPROSTONE 8 MCG PO CAPS
8.0000 ug | ORAL_CAPSULE | Freq: Two times a day (BID) | ORAL | 2 refills | Status: DC
Start: 2023-11-30 — End: 2024-08-29

## 2023-11-30 NOTE — Progress Notes (Signed)
Madelaine Bhat, CMA,acting as a Neurosurgeon for Katie Felts, FNP.,have documented all relevant documentation on the behalf of Katie Felts, FNP,as directed by  Katie Felts, FNP while in the presence of Katie Felts, FNP.  Subjective:  Patient ID: Katie Potts , female    DOB: Oct 14, 1951 , 72 y.o.   MRN: [id number]  Chief Complaint  Patient presents with   Hypertension    HPI  Patient presents today for a bp and chol follow up. She did take her medication this morning about 6am. She reports she got lost coming to the office. Patient reports compliance with medication. Patient denies any chest pain, SOB, or headaches. Patient has no concerns today. She is requesting a SCAT form to be completed. She had difficulty with getting to the office today. She is unable to see at night.   Constipation This is a chronic problem. The current episode started more than 1 year ago. The problem is unchanged. Her stool frequency is 1 time per day. The patient is on a high fiber diet. She Does not exercise regularly. There has Been adequate water intake. Associated symptoms include abdominal pain and nausea. Pertinent negatives include no bloating. Associated symptoms comments: Stomach burning. Risk factors include obesity, stress and immobility. She has tried diet changes, fiber and stool softeners for the symptoms.    Past Medical History:  Diagnosis Date   Asthma    Chest pain of uncertain etiology 03/16/2021   Depression    MAJOR DEPRESSION, DR. KAUR;PSYCHIATRIST   HTN (hypertension) 07/06/2019   Hyperlipidemia    Hypokalemia 03/16/2021   Snoring 03/16/2021     Family History  Problem Relation Age of Onset   Pancreatic cancer Mother    Cancer Mother    Hyperlipidemia Mother    Hypertension Mother    Varicose Veins Mother    Cancer Father    Deep vein thrombosis Father    Diabetes Father    Heart disease Father    Hyperlipidemia Father    Hypertension Father    Peripheral vascular disease Father         amputation   AAA (abdominal aortic aneurysm) Father    Hypertension Sister    Hyperlipidemia Sister    Mental illness Sister    Hyperlipidemia Sister    Hypertension Sister    Breast cancer Sister        unsure of age   Breast cancer Sister        63s   Colon cancer Paternal Uncle    Breast cancer Cousin        unsure of age   Heart attack Brother    Hypertension Brother      Current Outpatient Medications:    albuterol (VENTOLIN HFA) 108 (90 Base) MCG/ACT inhaler, Inhale 2 puffs into the lungs every 6 (six) hours as needed for wheezing or shortness of breath., Disp: 18 g, Rfl: 3   aspirin EC 81 MG tablet, Take 81 mg by mouth daily., Disp: , Rfl:    Bempedoic Acid (NEXLETOL) 180 MG TABS, Take 1 tablet (180 mg total) by mouth daily., Disp: 30 tablet, Rfl: 11   clonazePAM (KLONOPIN) 1 MG tablet, Take 1 mg by mouth 2 (two) times daily. 1/2 tablet by mouth up to twice daily as needed for anxiety or panic attacks., Disp: , Rfl:    diphenhydrAMINE HCl (BENADRYL ALLERGY PO), Take 2 tablets by mouth as needed., Disp: , Rfl:    docusate sodium (COLACE) 100 MG capsule, Take 1  capsule (100 mg total) by mouth daily as needed., Disp: 30 capsule, Rfl: 1   Evolocumab (REPATHA SURECLICK) 140 MG/ML SOAJ, ADMINISTER 1 ML UNDER THE SKIN EVERY 14 DAYS, Disp: 2 mL, Rfl: 5   FLUoxetine (PROZAC) 20 MG capsule, Take 40 mg by mouth every morning., Disp: , Rfl:    hydrochlorothiazide (HYDRODIURIL) 12.5 MG tablet, Take 1 tablet (12.5 mg total) by mouth daily., Disp: , Rfl:    valsartan (DIOVAN) 160 MG tablet, Take 1 tablet (160 mg total) by mouth daily., Disp: 90 tablet, Rfl: 3   zolpidem (AMBIEN) 10 MG tablet, Take 10 mg by mouth at bedtime as needed for sleep., Disp: , Rfl:    lubiprostone (AMITIZA) 8 MCG capsule, Take 1 capsule (8 mcg total) by mouth 2 (two) times daily with a meal., Disp: 60 capsule, Rfl: 2   Allergies  Allergen Reactions   Losartan     myalgias   Pravastatin     myalgias    Rosuvastatin     myalgias   Seroquel [Quetiapine Fumarate] Swelling     Review of Systems  Constitutional: Negative.   HENT: Negative.    Eyes: Negative.   Respiratory: Negative.    Cardiovascular: Negative.   Gastrointestinal:  Positive for abdominal pain, constipation and nausea. Negative for bloating.  Neurological: Negative.   Psychiatric/Behavioral: Negative.       Today's Vitals   11/30/23 0849  BP: (!) 130/90  Pulse: 75  Temp: 97.8 F (36.6 C)  TempSrc: Oral  Weight: 160 lb 3.2 oz (72.7 kg)  Height: 5\' 1"  (1.549 m)  PainSc: 0-No pain   Body mass index is 30.27 kg/m.  Wt Readings from Last 3 Encounters:  11/30/23 160 lb 3.2 oz (72.7 kg)  09/15/23 155 lb 6.4 oz (70.5 kg)  08/24/23 159 lb (72.1 kg)     Objective:  Physical Exam Vitals reviewed.  Constitutional:      General: She is not in acute distress.    Appearance: Normal appearance. She is well-developed. She is obese.  HENT:     Head: Normocephalic and atraumatic.  Eyes:     Pupils: Pupils are equal, round, and reactive to light.  Cardiovascular:     Rate and Rhythm: Normal rate and regular rhythm.     Pulses: Normal pulses.     Heart sounds: Normal heart sounds. No murmur heard. Pulmonary:     Effort: Pulmonary effort is normal. No respiratory distress.     Breath sounds: Normal breath sounds. No wheezing.  Musculoskeletal:        General: Normal range of motion.  Skin:    General: Skin is warm and dry.     Capillary Refill: Capillary refill takes less than 2 seconds.  Neurological:     General: No focal deficit present.     Mental Status: She is alert and oriented to person, place, and time.     Cranial Nerves: No cranial nerve deficit.     Motor: No weakness.  Psychiatric:        Mood and Affect: Mood normal.         Assessment And Plan:  Malignant systolic hypertension with congestive heart failure and chronic kidney disease, stage 1 through stage 4 or unspecified chronic kidney  disease (HCC) Assessment & Plan: Blood pressure is slightly elevated. Advised to focus on diet low in salt  Orders: -     BMP8+eGFR  Stage 2 chronic kidney disease Assessment & Plan: Continue staying well hydrated with  water and avoid NSAIDs   Mixed hyperlipidemia Assessment & Plan: Cholesterol levels are stable, will check lipid panel. Continue repatha due to intolerance to statins and zetia   COVID-19 vaccination declined Assessment & Plan: Declines covid 19 vaccine. Discussed risk of covid 79 and if she changes her mind about the vaccine to call the office. Education has been provided regarding the importance of this vaccine but patient still declined. Advised may receive this vaccine at local pharmacy or Health Dept.or vaccine clinic. Aware to provide a copy of the vaccination record if obtained from local pharmacy or Health Dept.  Encouraged to take multivitamin, vitamin d, vitamin c and zinc to increase immune system. Aware can call office if would like to have vaccine here at office. Verbalized acceptance and understanding.    Herpes zoster vaccination declined Assessment & Plan: Declines shingrix, educated on disease process and is aware if he changes his mind to notify office    Class 1 obesity due to excess calories with body mass index (BMI) of 30.0 to 30.9 in adult, unspecified whether serious comorbidity present Assessment & Plan: She is encouraged to strive for BMI less than 30 to decrease cardiac risk. Advised to aim for at least 150 minutes of exercise per week.    Polydipsia Assessment & Plan: Will check HgbA1c.   Orders: -     Hemoglobin A1c  Tingling of skin Assessment & Plan: Will check for metabolic causes  Orders: -     Vitamin B12 -     Hemoglobin A1c  Chronic idiopathic constipation Assessment & Plan: She has been doing well with Trulance, continue current medications  Orders: -     Lubiprostone; Take 1 capsule (8 mcg total) by mouth 2  (two) times daily with a meal.  Dispense: 60 capsule; Refill: 2 -     Ambulatory referral to Gastroenterology  Atherosclerotic peripheral vascular disease (HCC) Assessment & Plan: Continue statin   History of panic attacks Assessment & Plan: She would like a SCAT form completed due to becoming confused and having panic attacks at times while driving to different places.      Return for 6 month bp check.  Patient was given opportunity to ask questions. Patient verbalized understanding of the plan and was able to repeat key elements of the plan. All questions were answered to their satisfaction.    Jeanell Sparrow, FNP, have reviewed all documentation for this visit. The documentation on 11/30/23 for the exam, diagnosis, procedures, and orders are all accurate and complete.   IF YOU HAVE BEEN REFERRED TO A SPECIALIST, IT MAY TAKE 1-2 WEEKS TO SCHEDULE/PROCESS THE REFERRAL. IF YOU HAVE NOT HEARD FROM US/SPECIALIST IN TWO WEEKS, PLEASE GIVE [place] A CALL AT [phone] X [phone].

## 2023-12-01 LAB — BMP8+EGFR
BUN/Creatinine Ratio: 13 (ref 12–28)
BUN: 15 mg/dL (ref 8–27)
CO2: 26 mmol/L (ref 20–29)
Calcium: 9.7 mg/dL (ref 8.7–10.3)
Chloride: 100 mmol/L (ref 96–106)
Creatinine, Ser: 1.15 mg/dL — ABNORMAL HIGH (ref 0.57–1.00)
Glucose: 90 mg/dL (ref 70–99)
Potassium: 3.9 mmol/L (ref 3.5–5.2)
Sodium: 140 mmol/L (ref 134–144)
eGFR: 51 mL/min/{1.73_m2} — ABNORMAL LOW (ref >59)

## 2023-12-01 LAB — VITAMIN B12: Vitamin B-12: 1113 pg/mL (ref 232–1245)

## 2023-12-01 LAB — HEMOGLOBIN A1C
Est. average glucose Bld gHb Est-mCnc: 108 mg/dL
Hgb A1c MFr Bld: 5.4 % (ref 4.8–5.6)

## 2023-12-08 DIAGNOSIS — Z2821 Immunization not carried out because of patient refusal: Secondary | ICD-10-CM | POA: Insufficient documentation

## 2023-12-08 DIAGNOSIS — R202 Paresthesia of skin: Secondary | ICD-10-CM | POA: Insufficient documentation

## 2023-12-08 DIAGNOSIS — N182 Chronic kidney disease, stage 2 (mild): Secondary | ICD-10-CM | POA: Insufficient documentation

## 2023-12-08 DIAGNOSIS — Z8659 Personal history of other mental and behavioral disorders: Secondary | ICD-10-CM | POA: Insufficient documentation

## 2023-12-08 DIAGNOSIS — R631 Polydipsia: Secondary | ICD-10-CM | POA: Insufficient documentation

## 2023-12-08 NOTE — Assessment & Plan Note (Signed)

## 2023-12-08 NOTE — Assessment & Plan Note (Signed)
She has been doing well with Trulance, continue current medications

## 2023-12-08 NOTE — Assessment & Plan Note (Signed)
Continue statin. 

## 2023-12-08 NOTE — Assessment & Plan Note (Signed)
She would like a SCAT form completed due to becoming confused and having panic attacks at times while driving to different places.

## 2023-12-08 NOTE — Assessment & Plan Note (Signed)
Blood pressure is slightly elevated. Advised to focus on diet low in salt

## 2023-12-08 NOTE — Assessment & Plan Note (Signed)
Will check Hgb A1c 

## 2023-12-08 NOTE — Assessment & Plan Note (Signed)
Declines shingrix, educated on disease process and is aware if he changes his mind to notify office  

## 2023-12-08 NOTE — Assessment & Plan Note (Signed)
Continue staying well hydrated with water and avoid NSAIDs

## 2023-12-08 NOTE — Assessment & Plan Note (Signed)
 She is encouraged to strive for BMI less than 30 to decrease cardiac risk. Advised to aim for at least 150 minutes of exercise per week.

## 2023-12-08 NOTE — Assessment & Plan Note (Signed)
Cholesterol levels are stable, will check lipid panel. Continue repatha due to intolerance to statins and zetia

## 2023-12-08 NOTE — Assessment & Plan Note (Signed)
Will check for metabolic causes.

## 2023-12-09 ENCOUNTER — Telehealth: Payer: Self-pay | Admitting: Licensed Clinical Social Worker

## 2023-12-09 ENCOUNTER — Ambulatory Visit (HOSPITAL_BASED_OUTPATIENT_CLINIC_OR_DEPARTMENT_OTHER): Payer: Medicare Other

## 2023-12-09 NOTE — Patient Outreach (Signed)
  Care Coordination   Follow Up Visit Note   12/05/2023 Name: Lashonia Henjum MRN: [id number] DOB: 08/28/1951  Zanie Mccommon is a 72 y.o. year old female who sees Arnette Felts, FNP for primary care. I spoke with  [name] by phone today.  What matters to the patients health and wellness today?  Transportation    Goals Addressed             This Visit's Progress    Obtain Supportive Resources-Management of MH Conditions/Grief Support   On track    Activities and task to complete in order to accomplish goals.   Keep all upcoming appointments discussed today Continue with compliance of taking medication prescribed by Doctor Implement healthy coping skills discussed to assist with management of symptoms Review and complete Access [place] application mailed to residence         SDOH assessments and interventions completed:  No     Care Coordination Interventions:  Yes, provided  Interventions Today    Flowsheet Row Most Recent Value  Chronic Disease   Chronic disease during today's visit Hypertension (HTN)  General Interventions   General Interventions Discussed/Reviewed [facility], General Interventions Reviewed, Doctor Visits  [SCAT application has been submitted to PCP office]  Doctor Visits Discussed/Reviewed Doctor Visits Reviewed       Follow up plan: Follow up call scheduled for 4-6 weeks    Encounter Outcome:  Patient Visit Completed   [name], MSW, LCSW [facility] Care Management [facility] Health  [facility] [email or web address] Phone [phone] 6:32 AM

## 2023-12-09 NOTE — Patient Instructions (Signed)
Visit Information  Thank you for taking time to visit with me today. Please don't hesitate to contact me if I can be of assistance to you.   Following are the goals we discussed today:   Goals Addressed             This Visit's Progress    Obtain Supportive Resources-Management of MH Conditions/Grief Support   On track    Activities and task to complete in order to accomplish goals.   Keep all upcoming appointments discussed today Continue with compliance of taking medication prescribed by Doctor Implement healthy coping skills discussed to assist with management of symptoms Review and complete Access [place] application mailed to residence         Our next appointment is by telephone on 12/23   Please call the care guide team at [phone] if you need to cancel or reschedule your appointment.   If you are experiencing a Mental Health or Behavioral Health Crisis or need someone to talk to, please call the Suicide and Crisis Lifeline: 988 call 911   The patient verbalized understanding of instructions, educational materials, and care plan provided today and DECLINED offer to receive copy of patient instructions, educational materials, and care plan.   [name], MSW, LCSW [facility] Care Management [place]  [facility] [email or web address] Phone [phone] 6:33 AM

## 2023-12-12 ENCOUNTER — Ambulatory Visit: Payer: Self-pay | Admitting: Licensed Clinical Social Worker

## 2023-12-13 ENCOUNTER — Other Ambulatory Visit (HOSPITAL_COMMUNITY): Payer: Self-pay

## 2023-12-13 ENCOUNTER — Telehealth: Payer: Self-pay | Admitting: Pharmacy Technician

## 2023-12-13 NOTE — Telephone Encounter (Signed)
Pharmacy Patient Advocate Encounter   Received notification from CoverMyMeds that prior authorization for Nexletol 180MG  tablets is required/requested.   Insurance verification completed.   The patient is insured through [facility] .   Per test claim: PA required; PA submitted to above mentioned insurance via CoverMyMeds Key/confirmation #/EOC [facility] Status is pending  To be noted: Received request to submit a prior auth for Nexletol through covermymeds. I ran a test claim today and the patient charge would be 102.66 (patient also has a [employer] grant, see other encounter). However, [id number] for NEXLETOL TAB 180MG  is set to expire on 12/20/2023 so I went ahead and started another prior auth today under this key.

## 2023-12-13 NOTE — Patient Outreach (Signed)
  Care Coordination   Follow Up Visit Note   12/12/2023 Name: Katie Potts MRN: [id number] DOB: 19-Apr-1951  Katie Potts is a 72 y.o. year old female who sees Arnette Felts, FNP for primary care. I spoke with  Katie Potts by phone today.  What matters to the patients health and wellness today?  Symptom Management    Goals Addressed             This Visit's Progress    Obtain Supportive Resources-Management of MH Conditions/Grief Support   On track    Activities and task to complete in order to accomplish goals.   Keep all upcoming appointments discussed today Continue with compliance of taking medication prescribed by Doctor Implement healthy coping skills discussed to assist with management of symptoms F/up with psychiatrist about med side effects (weight gain) and discuss tx plan         SDOH assessments and interventions completed:  No     Care Coordination Interventions:  Yes, provided  Interventions Today    Flowsheet Row Most Recent Value  Chronic Disease   Chronic disease during today's visit Hypertension (HTN), Congestive Heart Failure (CHF), Chronic Kidney Disease/End Stage Renal Disease (ESRD), Other  [Grief]  General Interventions   General Interventions Discussed/Reviewed General Interventions Reviewed, Community Resources, Doctor Visits  Doctor Visits Discussed/Reviewed Doctor Visits Reviewed  Mental Health Interventions   Mental Health Discussed/Reviewed Mental Health Reviewed, Coping Strategies, Anxiety, Depression, Grief and Loss  [Boundary Setting/Self-Care/Grief/Strengths Based/Solution Focused]  Nutrition Interventions   Nutrition Discussed/Reviewed Nutrition Reviewed  Pharmacy Interventions   Pharmacy Dicussed/Reviewed Pharmacy Topics Reviewed, Medication Adherence       Follow up plan: Follow up call scheduled for 2-4 weeks    Encounter Outcome:  Patient Visit Completed   [name], MSW, LCSW [facility] Care Management [facility] Health   [facility] [name] Phone [phone] 12:40 PM

## 2023-12-13 NOTE — Patient Instructions (Signed)
Visit Information  Thank you for taking time to visit with me today. Please don't hesitate to contact me if I can be of assistance to you.   Following are the goals we discussed today:   Goals Addressed             This Visit's Progress    Obtain Supportive Resources-Management of MH Conditions/Grief Support   On track    Activities and task to complete in order to accomplish goals.   Keep all upcoming appointments discussed today Continue with compliance of taking medication prescribed by Doctor Implement healthy coping skills discussed to assist with management of symptoms F/up with psychiatrist about med side effects (weight gain) and discuss tx plan         Our next appointment is by telephone on 1/6 at 1:30  Please call the care guide team at [phone] if you need to cancel or reschedule your appointment.   If you are experiencing a Mental Health or Behavioral Health Crisis or need someone to talk to, please call the Suicide and Crisis Lifeline: 988 call 911   The patient verbalized understanding of instructions, educational materials, and care plan provided today and DECLINED offer to receive copy of patient instructions, educational materials, and care plan.   [name], MSW, LCSW [facility] Care Management [place]  [facility] [email or web address] Phone [phone] 12:41 PM

## 2023-12-15 ENCOUNTER — Ambulatory Visit: Payer: Self-pay

## 2023-12-15 ENCOUNTER — Other Ambulatory Visit (HOSPITAL_COMMUNITY): Payer: Self-pay

## 2023-12-15 NOTE — Telephone Encounter (Signed)
Pharmacy Patient Advocate Encounter  Received notification from [facility] that Prior Authorization for Nexletol 180MG  tablets  has been APPROVED from 12/13/23 to 06/12/24. Ran test claim, Copay is $102.66. This test claim was processed through [facility]- copay amounts may vary at other pharmacies due to pharmacy/plan contracts, or as the patient moves through the different stages of their insurance plan.   PA #/Case ID/Reference #: [id number]

## 2023-12-15 NOTE — Patient Outreach (Signed)
  Care Coordination   12/15/2023 Name: Katie Potts MRN: [id number] DOB: 04/01/1951   Care Coordination Outreach Attempts:  An unsuccessful outreach was attempted for an appointment today.  Follow Up Plan:  Additional outreach attempts will be made to offer the patient complex care management information and services.   Encounter Outcome:  No Answer   Care Coordination Interventions:  No, not indicated    Delsa Sale RN BSN CCM [place]  [facility], [facility] Health Nurse Care Coordinator  Direct Dial: [phone] Website: [email or web address]

## 2023-12-23 ENCOUNTER — Telehealth: Payer: Self-pay | Admitting: Licensed Clinical Social Worker

## 2023-12-23 NOTE — Patient Outreach (Signed)
  Care Coordination   Follow Up Visit Note   12/23/2023 Name: Katie Potts MRN: [id number] DOB: 13-Aug-1951  [name] is a 73 y.o. year old female who sees Georgina Speaks, FNP for primary care. I spoke with  Iretta Sanor by phone today.  What matters to the patients health and wellness today?  Transportation and Grief    Goals Addressed             This Visit's Progress    Obtain Supportive Resources-Management of MH Conditions/Grief Support   On track    Activities and task to complete in order to accomplish goals.   Keep all upcoming appointments discussed today Continue with compliance of taking medication prescribed by Doctor Implement healthy coping skills discussed to assist with management of symptoms F/up with psychiatrist about med side effects (weight gain) and discuss tx plan         SDOH assessments and interventions completed:  No     Care Coordination Interventions:  Yes, provided  Interventions Today    Flowsheet Row Most Recent Value  Chronic Disease   Chronic disease during today's visit Hypertension (HTN), Congestive Heart Failure (CHF), Chronic Kidney Disease/End Stage Renal Disease (ESRD), Other  [Grief]  General Interventions   General Interventions Discussed/Reviewed General Interventions Reviewed, [facility], Doctor Visits  [Pt obtained letter from [employer] requesting additional information (page 6 and 7 of application) and completion of Part B. LCSW will collaborate with PCP office about if application (with requested info) has been submitted]  Doctor Visits Discussed/Reviewed Doctor Visits Reviewed  Mental Health Interventions   Mental Health Discussed/Reviewed Mental Health Reviewed, Coping Strategies, Grief and Loss       Follow up plan: Follow up call scheduled for 01/06    Encounter Outcome:  Patient Visit Completed   Rolin Kerns, MSW, LCSW [facility] Care Management [place] [facility] Health  [facility] [email or web address] Phone [phone] 10:46 AM

## 2023-12-23 NOTE — Patient Instructions (Signed)
 Visit Information  Thank you for taking time to visit with me today. Please don't hesitate to contact me if I can be of assistance to you.   Following are the goals we discussed today:   Goals Addressed             This Visit's Progress    Obtain Supportive Resources-Management of MH Conditions/Grief Support   On track    Activities and task to complete in order to accomplish goals.   Keep all upcoming appointments discussed today Continue with compliance of taking medication prescribed by Doctor Implement healthy coping skills discussed to assist with management of symptoms F/up with psychiatrist about med side effects (weight gain) and discuss tx plan         Our next appointment is by telephone on 01/06 at 11:30 AM  Please call the care guide team at [phone] if you need to cancel or reschedule your appointment.   If you are experiencing a Mental Health or Behavioral Health Crisis or need someone to talk to, please call the Suicide and Crisis Lifeline: 988 call 911   The patient verbalized understanding of instructions, educational materials, and care plan provided today and DECLINED offer to receive copy of patient instructions, educational materials, and care plan.   Qualyn Oyervides, MSW, LCSW [facility] Care Management [place]  [facility] [email or web address] Phone [phone] 10:46 AM

## 2023-12-26 ENCOUNTER — Ambulatory Visit: Payer: Self-pay | Admitting: Licensed Clinical Social Worker

## 2023-12-27 NOTE — Patient Outreach (Signed)
  Care Coordination   Follow Up Visit Note   12/26/2023 Name: Delayne Sanzo MRN: [id number] DOB: 15-May-1951  Sherrita Riederer is a 73 y.o. year old female who sees Georgina Speaks, FNP for primary care. I spoke with  Skylarr Walthall by phone today.  What matters to the patients health and wellness today?  Access [place]    Goals Addressed             This Visit's Progress    Obtain Supportive Resources-Management of MH Conditions/Grief Support   On track    Activities and task to complete in order to accomplish goals.   Keep all upcoming appointments discussed today Continue with compliance of taking medication prescribed by Doctor Implement healthy coping skills discussed to assist with management of symptoms F/up with psychiatrist about med side effects (weight gain) and discuss tx plan         SDOH assessments and interventions completed:  No     Care Coordination Interventions:  Yes, provided  Interventions Today    Flowsheet Row Most Recent Value  Chronic Disease   Chronic disease during today's visit Hypertension (HTN), Congestive Heart Failure (CHF), Chronic Kidney Disease/End Stage Renal Disease (ESRD)  General Interventions   General Interventions Discussed/Reviewed General Interventions Reviewed, Communication with, [employer] [place] Application]  Communication with --  [LCSW made an attempt to contact PCP office to discuss pt's original Access [place] application]       Follow up plan: Follow up call scheduled for 1-2 days    Encounter Outcome:  Patient Visit Completed   Rolin Kerns, MSW, LCSW [facility] Care Management [facility] Health  [facility] [email or web address] Phone [phone] 5:05 PM

## 2023-12-28 ENCOUNTER — Encounter: Payer: Self-pay | Admitting: Licensed Clinical Social Worker

## 2023-12-30 NOTE — Patient Outreach (Signed)
  Care Coordination   Follow Up Visit Note   12/28/2023 Name: Katie Potts MRN: [id number] DOB: [birth year]  Katie Potts is a 73 y.o. year old female who sees Katie Speaks, FNP for primary care. I  engaged with PCP office  What matters to the patients health and wellness today?  Pt was not engaged during this encounter    Goals Addressed             This Visit's Progress    Obtain Supportive Resources-Management of MH Conditions/Grief Support   On track    Activities and task to complete in order to accomplish goals.   Keep all upcoming appointments discussed today Continue with compliance of taking medication prescribed by Doctor Implement healthy coping skills discussed to assist with management of symptoms F/up with psychiatrist about med side effects (weight gain) and discuss tx plan         SDOH assessments and interventions completed:  No     Care Coordination Interventions:  Yes, provided  Interventions Today    Flowsheet Row Most Recent Value  Chronic Disease   Chronic disease during today's visit Hypertension (HTN), Congestive Heart Failure (CHF), Chronic Kidney Disease/End Stage Renal Disease (ESRD)  General Interventions   General Interventions Discussed/Reviewed Communication with, Tour Manager with PCP/Specialists  APPLE COMPUTER collaborated with PCP and CMA regarding Access [place] application pt submitted. CMA emailed scanned application to LCSW to revise/update]       Follow up plan: Follow up call scheduled for within a week    Encounter Outcome:  Patient Visit Completed   Katie Potts, MSW, LCSW [facility] Care Management [facility]  [facility] [email or web address] Phone [phone] 3:37 PM

## 2024-01-02 ENCOUNTER — Telehealth (HOSPITAL_BASED_OUTPATIENT_CLINIC_OR_DEPARTMENT_OTHER): Payer: Self-pay | Admitting: Cardiovascular Disease

## 2024-01-02 NOTE — Telephone Encounter (Signed)
 Patient gets her Repatha via Lupita Shutter Per Roxine Caddy D they should cover Nexlizet as well.  Advised patient, verbalized understanding

## 2024-01-02 NOTE — Telephone Encounter (Signed)
 Pt c/o medication issue:  1. Name of Medication:Nexletol   2. How are you currently taking this medication (dosage and times per day)?   3. Are you having a reaction (difficulty breathing--STAT)?   4. What is your medication issue? Patient said when she went to the pharmacy they told her it would cost 47.00- ahe had just received a letter stating that she was approved for assistance with this medicine- she siad the case number for the letter was [id number]

## 2024-01-13 ENCOUNTER — Ambulatory Visit (HOSPITAL_BASED_OUTPATIENT_CLINIC_OR_DEPARTMENT_OTHER): Payer: Medicare Other

## 2024-01-17 ENCOUNTER — Telehealth: Payer: Self-pay | Admitting: Licensed Clinical Social Worker

## 2024-01-17 NOTE — Patient Outreach (Signed)
  Care Coordination   Follow Up Visit Note   01/16/2024 Name: Katie Potts MRN: [id number] DOB: Nov 29, 1951  Katie Potts is a 73 y.o. year old female who sees Katie Felts, FNP for primary care. I spoke with  Katie Potts by phone today.  What matters to the patients health and wellness today?  Symptom Management    Goals Addressed             This Visit's Progress    Obtain Supportive Resources-Management of MH Conditions/Grief Support   On track    Activities and task to complete in order to accomplish goals.   Keep all upcoming appointments discussed today Continue with compliance of taking medication prescribed by Doctor Implement healthy coping skills discussed to assist with management of symptoms F/up with psychiatrist about med side effects (weight gain) and discuss tx plan         SDOH assessments and interventions completed:  No     Care Coordination Interventions:  Yes, provided  Interventions Today    Flowsheet Row Most Recent Value  Chronic Disease   Chronic disease during today's visit Hypertension (HTN), Congestive Heart Failure (CHF), Chronic Kidney Disease/End Stage Renal Disease (ESRD)  General Interventions   General Interventions Discussed/Reviewed General Interventions Reviewed, Doctor Visits  Doctor Visits Discussed/Reviewed Doctor Visits Reviewed  Exercise Interventions   Exercise Discussed/Reviewed Exercise Reviewed, Physical Activity, Weight Managment  Mental Health Interventions   Mental Health Discussed/Reviewed Mental Health Reviewed, Coping Strategies, Grief and Loss, Anxiety, Depression  [Self care and rest prioritized]       Follow up plan: Follow up call scheduled for 2-4 weeks    Encounter Outcome:  Patient Visit Completed   Katie Lucks, LCSW [place]  [facility], [facility] Clinical Social Worker Direct Dial: [phone]  Fax: [phone] Website: [email or web address] 4:05 PM

## 2024-01-17 NOTE — Patient Instructions (Signed)
Visit Information  Thank you for taking time to visit with me today. Please don't hesitate to contact me if I can be of assistance to you.   Following are the goals we discussed today:   Goals Addressed             This Visit's Progress    Obtain Supportive Resources-Management of MH Conditions/Grief Support   On track    Activities and task to complete in order to accomplish goals.   Keep all upcoming appointments discussed today Continue with compliance of taking medication prescribed by Doctor Implement healthy coping skills discussed to assist with management of symptoms F/up with psychiatrist about med side effects (weight gain) and discuss tx plan         Our next appointment is by telephone on 2/6 at 11 AM  Please call the care guide team at [phone] if you need to cancel or reschedule your appointment.   If you are experiencing a Mental Health or Behavioral Health Crisis or need someone to talk to, please call the Suicide and Crisis Lifeline: 988 call 911   The patient verbalized understanding of instructions, educational materials, and care plan provided today and DECLINED offer to receive copy of patient instructions, educational materials, and care plan.   Windy Fast [facility] Health  [facility], [facility] Clinical Social Worker Direct Dial: [phone]  Fax: [phone] Website: [email or web address] 4:07 PM

## 2024-01-24 ENCOUNTER — Encounter (HOSPITAL_BASED_OUTPATIENT_CLINIC_OR_DEPARTMENT_OTHER): Payer: Self-pay

## 2024-01-24 ENCOUNTER — Ambulatory Visit (HOSPITAL_BASED_OUTPATIENT_CLINIC_OR_DEPARTMENT_OTHER): Payer: Medicare Other | Admitting: Pharmacist Clinician (PhC)/ Clinical Pharmacy Specialist

## 2024-01-24 VITALS — BP 176/94 | HR 78

## 2024-01-24 DIAGNOSIS — I10 Essential (primary) hypertension: Secondary | ICD-10-CM

## 2024-01-24 MED ORDER — CARVEDILOL 6.25 MG PO TABS: 6.2500 mg | ORAL_TABLET | Freq: Two times a day (BID) | ORAL | 3 refills | Status: DC

## 2024-01-24 NOTE — Assessment & Plan Note (Signed)
 Assessment: BP is uncontrolled in office BP 186/93 mmHg;  above the goal (<130/80). Stopped valsartan  3 weeks ago Tolerates hydrochlorothiazide  well without any side effects Denies SOB, palpitation, chest pain, headaches,or swelling Reiterated the importance of regular exercise and low salt diet   Plan:  Stop taking valsartan  Start taking carvedilol  6.25 mg twice daily Continue taking hydrochlorothiazide  12.5 mg once daily Patient to keep record of BP readings with heart rate and report to us  at the next visit Patient to follow up with PharmD in 6 weeks  Labs ordered today:  none

## 2024-01-24 NOTE — Patient Instructions (Signed)
 Follow up appointment: Wednesday March 26 at 9:30 am  Take your BP meds as follows:   Start carvedilol  6.25 mg twice daily  Continue with hydrochlorothiazide  12.5 mg once daily  Check your blood pressure at home daily (if able) and keep record of the readings.  Your blood pressure goal is <130/80  To check your pressure at home you will need to:  1. Sit up in a chair, with feet flat on the floor and back supported. Do not cross your ankles or legs. 2. Rest your left arm so that the cuff is about heart level. If the cuff goes on your upper arm,  then just relax the arm on the table, arm of the chair or your lap. If you have a wrist cuff, we  suggest relaxing your wrist against your chest (think of it as Pledging the Flag with the  wrong arm).  3. Place the cuff snugly around your arm, about 1 inch above the crook of your elbow. The  cords should be inside the groove of your elbow.  4. Sit quietly, with the cuff in place, for about 5 minutes. After that 5 minutes press the power  button to start a reading. 5. Do not talk or move while the reading is taking place.  6. Record your readings on a sheet of paper. Although most cuffs have a memory, it is often  easier to see a pattern developing when the numbers are all in front of you.  7. You can repeat the reading after 1-3 minutes if it is recommended  Make sure your bladder is empty and you have not had caffeine or tobacco within the last 30 min  Always bring your blood pressure log with you to your appointments. If you have not brought your monitor in to be double checked for accuracy, please bring it to your next appointment.  You can find a list of quality blood pressure cuffs at [email or web address]  Important lifestyle changes to control high blood pressure  Intervention  Effect on the BP  Lose extra pounds and watch your waistline Weight loss is one of the most effective lifestyle changes for controlling blood pressure. If you're  overweight or obese, losing even a small amount of weight can help reduce blood pressure. Blood pressure might go down by about 1 millimeter of mercury (mm Hg) with each kilogram (about 2.2 pounds) of weight lost.  Exercise regularly As a general goal, aim for at least 30 minutes of moderate physical activity every day. Regular physical activity can lower high blood pressure by about 5 to 8 mm Hg.  Eat a healthy diet Eating a diet rich in whole grains, fruits, vegetables, and low-fat dairy products and low in saturated fat and cholesterol. A healthy diet can lower high blood pressure by up to 11 mm Hg.  Reduce salt (sodium) in your diet Even a small reduction of sodium in the diet can improve heart health and reduce high blood pressure by about 5 to 6 mm Hg.  Limit alcohol One drink equals 12 ounces of beer, 5 ounces of wine, or 1.5 ounces of 80-proof liquor.  Limiting alcohol to less than one drink a day for women or two drinks a day for men can help lower blood pressure by about 4 mm Hg.   If you have any questions or concerns please use My Chart to send questions or call the office at [phone]

## 2024-01-24 NOTE — Progress Notes (Signed)
 01/24/2024 Katie Potts [birth year] [id number]   HPI:  Katie Potts is a 73 y.o. female patient of Dr Raford, with a PMH below who presents today for hypertension management.  She had [facility] been [facility], but now is following up with Dr. Raford as her primary cardiologist, as Dr. Claudene has retired.  When she was seen in August, her primary concern was dizziness and nausea that would come on without warning.  Occurred once while washing dishes, had to lie down on the floor and was unable to get to her bedroom.  She also reported chronic fatigue and intermittent racing palpitations.  Home BP readings were 170 or higher systolic consistently.  I saw her in October, at which time she was stressed because her sister was switching care facilities and patient was not able to be with her.  BP that day was 183/79.  We did not change any medications that day and she was asked to continue monitoring.  Today she returns for follow up.  Stopped valsartan  about 3 weeks ago because of chronic headaches.  States she is feeling better, although the headaches have not gone away completely.  Suggested that this is probably not the cause, as valsartan  would be completely out of her system in just a few days.  Notes she is still having family stresses, in that her cousin just recently passed away and she had to write the obituary.    Past Medical History: ASCVD CAC score 73 (80th percentile), prior TIA  hyperlipidemia LDL at 124 (10/21) on Repatha , Nexletol   Diastolic HF chronic     Blood Pressure Goal:  130/80  Current Medications: hctz 12.5 mg every day   Family Hx: mother died at 49 cancer, dad at 27 stroke, both with hypertension, 4 siblings, brother died form MI at 4, others all with hypertension, 1 son, now deceased,   Social Hx: no tobacco, occasional alcohol, drinks occasional decaf coffee  Diet: tries to eat home cooked, but travelling a lot to sister in  [place], plenty of chicken (rotissire all week) with vegetables (frozen), occasional rice;  doesn't snack much   Exercise: not currently, tried walking about 5 minutes, felt tired.   Home BP readings: no home readings with her today - does note 168/99 this morning, and states HR has been up recently, as high as 100 at times  Intolerances: amlodipine  -headaches, edema; now tolerating; losartan  and olmesartan didn't work, hctz may have caused hypokalemia  Labs: 12/24:  Na 140, K 3.9, Glu 90, SCr 1.15, GFR 51   Wt Readings from Last 3 Encounters:  11/30/23 160 lb 3.2 oz (72.7 kg)  09/15/23 155 lb 6.4 oz (70.5 kg)  08/24/23 159 lb (72.1 kg)   BP Readings from Last 3 Encounters:  01/24/24 (!) 176/94  11/30/23 (!) 130/90  10/11/23 (!) 183/79   Pulse Readings from Last 3 Encounters:  01/24/24 78  11/30/23 75  10/11/23 (!) 58    Current Outpatient Medications  Medication Sig Dispense Refill   carvedilol  (COREG ) 6.25 MG tablet Take 1 tablet (6.25 mg total) by mouth 2 (two) times daily. 180 tablet 3   albuterol  (VENTOLIN  HFA) 108 (90 Base) MCG/ACT inhaler Inhale 2 puffs into the lungs every 6 (six) hours as needed for wheezing or shortness of breath. 18 g 3   aspirin EC 81 MG tablet Take 81 mg by mouth daily.     Bempedoic Acid  (NEXLETOL ) 180 MG TABS Take 1 tablet (  180 mg total) by mouth daily. 30 tablet 11   clonazePAM (KLONOPIN) 1 MG tablet Take 1 mg by mouth 2 (two) times daily. 1/2 tablet by mouth up to twice daily as needed for anxiety or panic attacks.     diphenhydrAMINE  HCl (BENADRYL  ALLERGY PO) Take 2 tablets by mouth as needed.     docusate sodium  (COLACE) 100 MG capsule Take 1 capsule (100 mg total) by mouth daily as needed. 30 capsule 1   Evolocumab  (REPATHA  SURECLICK) 140 MG/ML SOAJ ADMINISTER 1 ML UNDER THE SKIN EVERY 14 DAYS 2 mL 5   FLUoxetine (PROZAC) 20 MG capsule Take 40 mg by mouth every morning.     hydrochlorothiazide  (HYDRODIURIL ) 12.5 MG tablet Take 1 tablet (12.5 mg  total) by mouth daily.     lubiprostone  (AMITIZA ) 8 MCG capsule Take 1 capsule (8 mcg total) by mouth 2 (two) times daily with a meal. 60 capsule 2   valsartan  (DIOVAN ) 160 MG tablet Take 1 tablet (160 mg total) by mouth daily. 90 tablet 3   zolpidem (AMBIEN) 10 MG tablet Take 10 mg by mouth at bedtime as needed for sleep.     No current facility-administered medications for this visit.    Allergies  Allergen Reactions   Amlodipine  Swelling and Other (See Comments)   Losartan      myalgias   Pravastatin     myalgias   Rosuvastatin      myalgias   Seroquel [Quetiapine Fumarate] Swelling    Past Medical History:  Diagnosis Date   Asthma    Chest pain of uncertain etiology 03/16/2021   Depression    MAJOR DEPRESSION, DR. KAUR;PSYCHIATRIST   HTN (hypertension) 07/06/2019   Hyperlipidemia    Hypokalemia 03/16/2021   Snoring 03/16/2021    Blood pressure (!) 176/94, pulse 78.  HTN (hypertension) Assessment: BP is uncontrolled in office BP 186/93 mmHg;  above the goal (<130/80). Stopped valsartan  3 weeks ago Tolerates hydrochlorothiazide  well without any side effects Denies SOB, palpitation, chest pain, headaches,or swelling Reiterated the importance of regular exercise and low salt diet   Plan:  Stop taking valsartan  Start taking carvedilol  6.25 mg twice daily Continue taking hydrochlorothiazide  12.5 mg once daily Patient to keep record of BP readings with heart rate and report to us  at the next visit Patient to follow up with PharmD in 6 weeks  Labs ordered today:  none   Allean Mink PharmD CPP [facility] [address] [place],  [address]

## 2024-01-26 ENCOUNTER — Ambulatory Visit: Payer: Self-pay | Admitting: Licensed Clinical Social Worker

## 2024-01-26 NOTE — Patient Instructions (Signed)
 Visit Information  Thank you for taking time to visit with me today. Please don't hesitate to contact me if I can be of assistance to you.   Following are the goals we discussed today:   Goals Addressed             This Visit's Progress    Obtain Supportive Resources-Management of MH Conditions/Grief Support   On track    Activities and task to complete in order to accomplish goals.   Keep all upcoming appointments discussed today Continue with compliance of taking medication prescribed by Doctor Implement healthy coping skills discussed to assist with management of symptoms F/up with psychiatrist about med side effects (weight gain) and discuss tx plan [facility] [employer] [place] for any transportation needs within [place]          Our next appointment is by telephone on 02/23/24 at 11 AM  Please call the care guide team at [phone] if you need to cancel or reschedule your appointment.   If you are experiencing a Mental Health or Behavioral Health Crisis or need someone to talk to, please call the Suicide and Crisis Lifeline: 988 call 911   The patient verbalized understanding of instructions, educational materials, and care plan provided today and DECLINED offer to receive copy of patient instructions, educational materials, and care plan.   Katie Potts [facility]  [facility], [facility] Clinical Social Worker Direct Dial: [phone]  Fax: [phone] Website: [email or web address] 11:25 AM

## 2024-01-26 NOTE — Patient Outreach (Addendum)
  Care Coordination   Follow Up Visit Note   01/26/2024 Name: Katie Potts MRN: [id number] DOB: 1951/03/07  Katie Potts is a 73 y.o. year old female who sees Georgina Speaks, FNP for primary care. I spoke with  Katie Potts by phone today.  What matters to the patients health and wellness today?  Transportation    Goals Addressed             This Visit's Progress    Obtain Supportive Resources-Management of MH Conditions/Grief Support   On track    Activities and task to complete in order to accomplish goals.   Keep all upcoming appointments discussed today Continue with compliance of taking medication prescribed by Doctor Implement healthy coping skills discussed to assist with management of symptoms F/up with psychiatrist about med side effects (weight gain) and discuss tx plan [facility] [place] for any transportation needs within [place] of [place]          SDOH assessments and interventions completed:  Yes  SDOH Interventions Today    Flowsheet Row Most Recent Value  SDOH Interventions   Transportation Interventions Intervention Not Indicated, SCAT (Specialized Community [employer])  [Pt has been approved for services for the next 3 years]        Care Coordination Interventions:  Yes, provided  Interventions Today    Flowsheet Row Most Recent Value  Chronic Disease   Chronic disease during today's visit Hypertension (HTN), Congestive Heart Failure (CHF), Chronic Kidney Disease/End Stage Renal Disease (ESRD)  General Interventions   General Interventions Discussed/Reviewed General Interventions Reviewed, [facility], Doctor Visits  Doctor Visits Discussed/Reviewed Doctor Visits Reviewed  Mental Health Interventions   Mental Health Discussed/Reviewed Mental Health Reviewed, Coping Strategies, Anxiety  Nutrition Interventions   Nutrition Discussed/Reviewed Nutrition Reviewed, Adding fruits and vegetables, Increasing proteins  [Monitoring salt  intake]  Pharmacy Interventions   Pharmacy Dicussed/Reviewed Pharmacy Topics Reviewed, Medication Adherence  [Patient recently met with Pharmacist and has had meds adjusted. Will f/up with her with any adverse side effects that may occur]  Safety Interventions   Safety Discussed/Reviewed Safety Reviewed       Follow up plan: Follow up call scheduled for 4 weeks    Encounter Outcome:  Patient Visit Completed   Rolin Kerns, LCSW [place]  [facility], [facility] Clinical Social Worker Direct Dial: [phone]  Fax: [phone] Website: [email or web address] 11:25 AM

## 2024-02-05 ENCOUNTER — Other Ambulatory Visit: Payer: Self-pay | Admitting: Nurse Practitioner

## 2024-02-05 DIAGNOSIS — K5904 Chronic idiopathic constipation: Secondary | ICD-10-CM

## 2024-02-06 ENCOUNTER — Other Ambulatory Visit: Payer: Self-pay | Admitting: Nurse Practitioner

## 2024-02-06 DIAGNOSIS — I10 Essential (primary) hypertension: Secondary | ICD-10-CM

## 2024-02-06 MED ORDER — HYDROCHLOROTHIAZIDE 12.5 MG PO TABS
12.5000 mg | ORAL_TABLET | Freq: Every day | ORAL | 1 refills | Status: DC
Start: 2024-02-06 — End: 2024-08-21

## 2024-02-23 ENCOUNTER — Ambulatory Visit: Payer: Self-pay | Admitting: Licensed Clinical Social Worker

## 2024-02-23 NOTE — Patient Outreach (Signed)
 Care Coordination   Follow Up Visit Note   02/23/2024 Name: Katie Potts MRN: [id number] DOB: 1951/04/26  Katie Potts is a 73 y.o. year old female who sees Arnette Felts, FNP for primary care. I spoke with  Katie Potts by phone today.  What matters to the patients health and wellness today?  Stress Management/Housing    Goals Addressed             This Visit's Progress    Obtain Supportive Resources-Management of MH Conditions/Grief Support   On track    Activities and task to complete in order to accomplish goals.   Keep all upcoming appointments discussed today Continue with compliance of taking medication prescribed by Doctor Implement healthy coping skills discussed to assist with management of symptoms [facility] [place] for any transportation needs within [place] of [employer] regarding tenant rights         SDOH assessments and interventions completed:  No     Care Coordination Interventions:  Yes, provided  Interventions Today    Flowsheet Row Most Recent Value  Chronic Disease   Chronic disease during today's visit Hypertension (HTN), Congestive Heart Failure (CHF), Chronic Kidney Disease/End Stage Renal Disease (ESRD), Other  [Hx of Panic Attacks]  General Interventions   General Interventions Discussed/Reviewed General Interventions Reviewed, [facility], Doctor Visits  [Legal Aid to assist with tenant rights]  Doctor Visits Discussed/Reviewed Doctor Visits Reviewed  Mental Health Interventions   Mental Health Discussed/Reviewed Mental Health Reviewed, Coping Strategies  [Discussed healthy coping skills to assist with stress management]  Nutrition Interventions   Nutrition Discussed/Reviewed Nutrition Reviewed  Pharmacy Interventions   Pharmacy Dicussed/Reviewed Pharmacy Topics Reviewed, Medication Adherence  Safety Interventions   Safety Discussed/Reviewed Safety Reviewed       Follow up plan: Follow up call scheduled for  4-6 weeks    Encounter Outcome:  Patient Visit Completed   [name], LCSW [place]  [facility], [facility] Clinical Social Worker Direct Dial: [phone]  Fax: [phone] Website: [email or web address] 11:29 AM

## 2024-02-23 NOTE — Patient Instructions (Signed)
 Visit Information  Thank you for taking time to visit with me today. Please don't hesitate to contact me if I can be of assistance to you.   Following are the goals we discussed today:   Goals Addressed             This Visit's Progress    Obtain Supportive Resources-Management of MH Conditions/Grief Support   On track    Activities and task to complete in order to accomplish goals.   Keep all upcoming appointments discussed today Continue with compliance of taking medication prescribed by Doctor Implement healthy coping skills discussed to assist with management of symptoms [place] [place] for any transportation needs within [place] of [employer] regarding tenant rights         Our next appointment is by telephone on 4/3 at 11 AM  Please call the care guide team at [phone] if you need to cancel or reschedule your appointment.   If you are experiencing a Mental Health or Behavioral Health Crisis or need someone to talk to, please call the Suicide and Crisis Lifeline: 988 call 911   The patient verbalized understanding of instructions, educational materials, and care plan provided today and DECLINED offer to receive copy of patient instructions, educational materials, and care plan.   Windy Fast [facility]  [facility], [facility] Clinical Social Worker Direct Dial: [phone]  Fax: [phone] Website: [email or web address] 11:34 AM

## 2024-02-28 ENCOUNTER — Ambulatory Visit: Payer: Self-pay

## 2024-02-28 NOTE — Patient Instructions (Signed)
 Visit Information  Thank you for taking time to visit with me today. Please don't hesitate to contact me if I can be of assistance to you.   Following are the goals we discussed today:   Goals Addressed             This Visit's Progress    To better manage high blood pressure   On track    Care Coordination Interventions: Evaluation of current treatment plan related to hypertension self management and patient's adherence to plan as established by provider Review of patient status, including review of consultant's reports, relevant laboratory and other test results Plan:  Stop taking valsartan Start taking carvedilol 6.25 mg twice daily Continue taking hydrochlorothiazide 12.5 mg once daily Patient to keep record of BP readings with heart rate and report to [place] at the next visit Patient to follow up with PharmD in 6 weeks  Labs ordered today:  none Reviewed medications with patient and discussed importance of compliance, determined patient did not start Carvedilol as directed due to she states she tried this medication and dosage in the past but experienced severe light headedness Sent in basket message to [facility] to advise patient did not start Carvedilol  Advised patient, providing education and rationale, to monitor blood pressure daily and record, calling PCP for findings outside established parameters Provided education on prescribed diet low Sodium Discussed complications of poorly controlled blood pressure such as heart disease, stroke, circulatory complications, vision complications, kidney impairment Discussed plans with patient for ongoing care coordination follow up and provided patient with direct contact information for nurse care coordinator Reviewed and discussed next upcoming in person visit with Phillips Hay [facility] set for 03/14/24 @09 :30 AM Scheduled nurse follow up call with patient for 03/20/24 @2 :30 PM Active listening / Reflection utilized  Emotional Support  Provided  Last practice recorded BP readings:  BP Readings from Last 3 Encounters:  01/24/24 (!) 176/94  11/30/23 (!) 130/90  10/11/23 (!) 183/79   Most recent eGFR/CrCl:  Lab Results  Component Value Date   EGFR 51 (L) 11/30/2023    No components found for: "CRCL"        Our next appointment is by telephone on 03/20/24 at 2:30 PM  Please call the care guide team at [phone] if you need to cancel or reschedule your appointment.   If you are experiencing a Mental Health or Behavioral Health Crisis or need someone to talk to, please call 1-800-273-TALK (toll free, 24 hour hotline)  The patient verbalized understanding of instructions, educational materials, and care plan provided today and DECLINED offer to receive copy of patient instructions, educational materials, and care plan.   Delsa Sale RN BSN CCM [place]  [facility],  [facility] Nurse Care Coordinator  Direct Dial: [phone] Website: [email or web address]

## 2024-02-28 NOTE — Patient Outreach (Signed)
 Care Coordination   Follow Up Visit Note   02/28/2024 Name: Katie Potts MRN: [id number] DOB: [birth year]  Katie Potts is a 73 y.o. year old female who sees Arnette Felts, FNP for primary care. I spoke with  Katie Potts by phone today.  What matters to the patients health and wellness today?  Patient would like to have reduced family stress to help lower her BP.     Goals Addressed             This Visit's Progress    To better manage high blood pressure   On track    Care Coordination Interventions: Evaluation of current treatment plan related to hypertension self management and patient's adherence to plan as established by provider Review of patient status, including review of consultant's reports, relevant laboratory and other test results Plan:  Stop taking valsartan Start taking carvedilol 6.25 mg twice daily Continue taking hydrochlorothiazide 12.5 mg once daily Patient to keep record of BP readings with heart rate and report to [place] at the next visit Patient to follow up with PharmD in 6 weeks  Labs ordered today:  none Reviewed medications with patient and discussed importance of compliance, determined patient did not start Carvedilol as directed due to she states she tried this medication and dosage in the past but experienced severe light headedness Sent in basket message to [facility] to advise patient did not start Carvedilol  Advised patient, providing education and rationale, to monitor blood pressure daily and record, calling PCP for findings outside established parameters Provided education on prescribed diet low Sodium Discussed complications of poorly controlled blood pressure such as heart disease, stroke, circulatory complications, vision complications, kidney impairment Discussed plans with patient for ongoing care coordination follow up and provided patient with direct contact information for nurse care coordinator Reviewed and discussed next upcoming in  person visit with [facility] set for 03/14/24 @09 :30 AM Scheduled nurse follow up call with patient for 03/20/24 @2 :30 PM Active listening / Reflection utilized  Emotional Support Provided  Last practice recorded BP readings:  BP Readings from Last 3 Encounters:  01/24/24 (!) 176/94  11/30/23 (!) 130/90  10/11/23 (!) 183/79   Most recent eGFR/CrCl:  Lab Results  Component Value Date   EGFR 51 (L) 11/30/2023    No components found for: "CRCL"    Interventions Today    Flowsheet Row Most Recent Value  Chronic Disease   Chronic disease during today's visit Hypertension (HTN)  General Interventions   General Interventions Discussed/Reviewed General Interventions Discussed, General Interventions Reviewed, Labs, Doctor Visits, Durable Medical Equipment (DME), Communication with  Doctor Visits Discussed/Reviewed Doctor Visits Discussed, Doctor Visits Reviewed, PCP  Durable Medical Equipment (DME) BP Cuff  Communication with Pharmacists  [Kristin Alvstad RPH-CPP via in basket message w/patient update]  Exercise Interventions   Exercise Discussed/Reviewed Physical Activity, Exercise Reviewed, Exercise Discussed  Physical Activity Discussed/Reviewed Physical Activity Reviewed, Physical Activity Discussed, Types of exercise  Education Interventions   Education Provided Provided Education  Provided Verbal Education On Medication, Exercise, When to see the doctor, Labs, Nutrition, Mental Health/Coping with Illness, Community Resources  Labs Reviewed Kidney Function, Hgb A1c  Mental Health Interventions   Mental Health Discussed/Reviewed Mental Health Discussed, Mental Health Reviewed, Coping Strategies, Depression, Anxiety  Nutrition Interventions   Nutrition Discussed/Reviewed Nutrition Reviewed, Nutrition Discussed, Decreasing salt, Fluid intake  Pharmacy Interventions   Pharmacy Dicussed/Reviewed Pharmacy Topics Discussed, Pharmacy Topics Reviewed, Medications and their functions,  Medication Adherence  Medication Adherence Not  taking medication          SDOH assessments and interventions completed:  No     Care Coordination Interventions:  Yes, provided   Follow up plan: Follow up call scheduled for 03/20/24 @2 :30 PM    Encounter Outcome:  Patient Visit Completed

## 2024-03-14 ENCOUNTER — Ambulatory Visit (HOSPITAL_BASED_OUTPATIENT_CLINIC_OR_DEPARTMENT_OTHER): Payer: Medicare Other

## 2024-03-20 ENCOUNTER — Ambulatory Visit: Payer: Self-pay

## 2024-03-20 NOTE — Patient Outreach (Signed)
 Care Coordination   03/20/2024 Name: Katie Potts MRN: [id number] DOB: 28-Nov-1951   Care Coordination Outreach Attempts:  An unsuccessful outreach was attempted for an appointment today.  Follow Up Plan:  Additional outreach attempts will be made to offer the patient complex care management information and services.   Encounter Outcome:  No Answer   Care Coordination Interventions:  No, not indicated    Delsa Sale RN BSN CCM [place]  [facility], [facility] Nurse Care Coordinator  Direct Dial: [phone] Website: [email or web address]

## 2024-03-22 ENCOUNTER — Ambulatory Visit: Payer: Self-pay | Admitting: Licensed Clinical Social Worker

## 2024-03-22 NOTE — Patient Instructions (Signed)
 Visit Information  Thank you for taking time to visit with me today. Please don't hesitate to contact me if I can be of assistance to you.   Following are the goals we discussed today:   Goals Addressed             This Visit's Progress    Obtain Supportive Resources-Management of MH Conditions/Grief Support   On track    Activities and task to complete in order to accomplish goals.   Keep all upcoming appointments discussed today Continue with compliance of taking medication prescribed by Doctor Implement healthy coping skills discussed to assist with management of symptoms Utilize [employer] [place] for any transportation needs within [place]           Our next appointment is by telephone on 6/5 at 11 AM  Please call the care guide team at [phone] if you need to cancel or reschedule your appointment.   If you are experiencing a Mental Health or Behavioral Health Crisis or need someone to talk to, please call the Suicide and Crisis Lifeline: 988 call 911   The patient verbalized understanding of instructions, educational materials, and care plan provided today and DECLINED offer to receive copy of patient instructions, educational materials, and care plan.   Windy Fast [facility]  [facility], [facility] Clinical Social Worker Direct Dial: [phone]  Fax: [phone] Website: [email or web address] 4:40 PM

## 2024-03-22 NOTE — Patient Outreach (Signed)
 Care Coordination   Follow Up Visit Note   03/22/2024 Name: Katie Potts MRN: [id number] DOB: [birth year]  Katie Potts is a 73 y.o. year old female who sees Katie Felts, FNP for primary care. I spoke with  Katie Potts by phone today.  What matters to the patients health and wellness today?  Symptom Management    Goals Addressed             This Visit's Progress    Obtain Supportive Resources-Management of MH Conditions/Grief Support   On track    Activities and task to complete in order to accomplish goals.   Keep all upcoming appointments discussed today Continue with compliance of taking medication prescribed by Doctor Implement healthy coping skills discussed to assist with management of symptoms Utilize [facility] [place] for any transportation needs within [place]           SDOH assessments and interventions completed:  No     Care Coordination Interventions:  Yes, provided  Interventions Today    Flowsheet Row Most Recent Value  Chronic Disease   Chronic disease during today's visit Other  [Anxiety]  General Interventions   General Interventions Discussed/Reviewed General Interventions Reviewed, Doctor Visits  Doctor Visits Discussed/Reviewed Doctor Visits Reviewed  Exercise Interventions   Exercise Discussed/Reviewed Exercise Reviewed, Physical Activity, Weight Managment  [Patient has purchased exercise equipment to assist her with strengthening body with decreased risk of injury]  Mental Health Interventions   Mental Health Discussed/Reviewed Mental Health Reviewed, Coping Strategies, Anxiety, Grief and Loss  [Self care strategies to assist in management of grief/stress/anxiety symptoms]  Nutrition Interventions   Nutrition Discussed/Reviewed Nutrition Reviewed  Pharmacy Interventions   Pharmacy Dicussed/Reviewed Pharmacy Topics Reviewed, Medication Adherence  Safety Interventions   Safety Discussed/Reviewed Safety Reviewed       Follow up  plan: Follow up call scheduled for 4-6 weeks    Encounter Outcome:  Patient Visit Completed   [name], LCSW [place]  [facility], [facility] Clinical Social Worker Direct Dial: [phone]  Fax: [phone] Website: [email or web address] 4:39 PM

## 2024-04-17 ENCOUNTER — Ambulatory Visit (HOSPITAL_BASED_OUTPATIENT_CLINIC_OR_DEPARTMENT_OTHER): Admitting: Pharmacist Clinician (PhC)/ Clinical Pharmacy Specialist

## 2024-04-17 ENCOUNTER — Encounter (HOSPITAL_BASED_OUTPATIENT_CLINIC_OR_DEPARTMENT_OTHER): Payer: Self-pay | Admitting: Pharmacist Clinician (PhC)/ Clinical Pharmacy Specialist

## 2024-04-17 VITALS — BP 181/93 | HR 60

## 2024-04-17 DIAGNOSIS — I10 Essential (primary) hypertension: Secondary | ICD-10-CM

## 2024-04-17 MED ORDER — REPATHA SURECLICK 140 MG/ML ~~LOC~~ SOAJ
140.0000 mg | SUBCUTANEOUS | 3 refills | Status: AC
Start: 2024-04-17 — End: ?

## 2024-04-17 MED ORDER — SPIRONOLACTONE 25 MG PO TABS
25.0000 mg | ORAL_TABLET | Freq: Every day | ORAL | 3 refills | Status: DC
Start: 2024-04-17 — End: 2024-05-08

## 2024-04-17 NOTE — Assessment & Plan Note (Signed)
 Assessment: BP is uncontrolled in office BP 181/93 mmHg;  above the goal (<130/80). Home averages 150/83 (AM) and 147/82 (PM) Tolerates HCTZ well without any side effects Denies SOB, palpitation, chest pain, headaches,or swelling Stopped carvedilol  2/2 breathing issues Reiterated the importance of regular exercise and low salt diet   Plan:  Start taking spironolactone 25 mg once daily Continue taking HCTZ 12.5 mg once daily Patient to keep record of BP readings with heart rate and report to us  at the next visit Patient to follow up in 2 months with me, 4-5 months with Dr. Lacie Piano ordered today:  BMET - 2 weeks

## 2024-04-17 NOTE — Progress Notes (Signed)
 04/17/2024 Zoha [place] Sep 12, 1951 [id number]   HPI:  Katie Potts is a 73 y.o. female patient of Dr Theodis Fiscal, with a PMH below who presents today for hypertension management.  She had previously been seen [facility], but now is following up with Dr. Theodis Fiscal as her primary cardiologist, as Dr. Felipe Horton has retired.  When she was seen in August, her primary concern was dizziness and nausea that would come on without warning.  Occurred once while washing dishes, had to lie down on the floor and was unable to get to her bedroom.  She also reported chronic fatigue and intermittent racing palpitations.  Home BP readings were 170 or higher systolic consistently.  I saw her in October, at which time she was stressed because her sister was switching care facilities and patient was not able to be with her.  BP that day was 183/79.  We did not change any medications that day and she was asked to continue monitoring. At follow up in February she had discontinued valsartan , thinking it was causing her headaches.  After 3 weeks they were less severe, but still occurring.  We had her start carvedilol  6.25 mg twice daily.    Today she returns for follow up.  She only took the carvedilol  for a short time, noted it was affecting her breathing.   Home readings still elevated only on HCTZ 12.5 mg.    Past Medical History: ASCVD CAC score 73 (80th percentile), prior TIA  hyperlipidemia LDL at 124 (10/21) on Repatha , Nexletol   Diastolic HF chronic     Blood Pressure Goal:  130/80  Current Medications: hctz 12.5 mg every day   Family Hx: mother died at 35 cancer, dad at 18 stroke, both with hypertension, 4 siblings, brother died form MI at 42, others all with hypertension, 1 son, now deceased,   Social Hx: no tobacco, occasional alcohol, drinks occasional decaf coffee  Diet: tries to eat home cooked, but travelling a lot to sister in [place], plenty of chicken (rotissire all week) with  vegetables (frozen), occasional rice;  doesn't snack much   Exercise: not currently, tried walking about 5 minutes, felt tired. Moving about more, decorating in the house  Home BP readings:     17 AM readings average 150/83  14 PM readings average 147/83  Intolerances: amlodipine  -headaches, edema;  losartan  - myalgias  olmesartan didn't work  carvedilol  - asthma flare  Labs: 12/24:  Na 140, K 3.9, Glu 90, SCr 1.15, GFR 51   Wt Readings from Last 3 Encounters:  11/30/23 160 lb 3.2 oz (72.7 kg)  09/15/23 155 lb 6.4 oz (70.5 kg)  08/24/23 159 lb (72.1 kg)   BP Readings from Last 3 Encounters:  04/17/24 (!) 181/93  01/24/24 (!) 176/94  11/30/23 (!) 130/90   Pulse Readings from Last 3 Encounters:  04/17/24 60  01/24/24 78  11/30/23 75    Current Outpatient Medications  Medication Sig Dispense Refill   spironolactone (ALDACTONE) 25 MG tablet Take 1 tablet (25 mg total) by mouth daily. 30 tablet 3   albuterol  (VENTOLIN  HFA) 108 (90 Base) MCG/ACT inhaler Inhale 2 puffs into the lungs every 6 (six) hours as needed for wheezing or shortness of breath. 18 g 3   aspirin EC 81 MG tablet Take 81 mg by mouth daily.     Bempedoic Acid  (NEXLETOL ) 180 MG TABS Take 1 tablet (180 mg total) by mouth daily. 30 tablet 11   carvedilol  (COREG )  6.25 MG tablet Take 1 tablet (6.25 mg total) by mouth 2 (two) times daily. 180 tablet 3   clonazePAM (KLONOPIN) 1 MG tablet Take 1 mg by mouth 2 (two) times daily. 1/2 tablet by mouth up to twice daily as needed for anxiety or panic attacks.     diphenhydrAMINE  HCl (BENADRYL  ALLERGY PO) Take 2 tablets by mouth as needed.     docusate sodium  (COLACE) 100 MG capsule TAKE 1 CAPSULE(100 MG) BY MOUTH DAILY AS NEEDED 30 capsule 1   Evolocumab  (REPATHA  SURECLICK) 140 MG/ML SOAJ Inject 140 mg into the skin every 14 (fourteen) days. 6 mL 3   FLUoxetine (PROZAC) 20 MG capsule Take 40 mg by mouth every morning.     hydrochlorothiazide  (HYDRODIURIL ) 12.5 MG tablet Take  1 tablet (12.5 mg total) by mouth daily. 90 tablet 1   lubiprostone  (AMITIZA ) 8 MCG capsule Take 1 capsule (8 mcg total) by mouth 2 (two) times daily with a meal. 60 capsule 2   zolpidem (AMBIEN) 10 MG tablet Take 10 mg by mouth at bedtime as needed for sleep.     No current facility-administered medications for this visit.    Allergies  Allergen Reactions   Amlodipine  Swelling and Other (See Comments)   Losartan      myalgias   Pravastatin     myalgias   Rosuvastatin      myalgias   Seroquel [Quetiapine Fumarate] Swelling    Past Medical History:  Diagnosis Date   Asthma    Chest pain of uncertain etiology 03/16/2021   Depression    MAJOR DEPRESSION, DR. KAUR;PSYCHIATRIST   HTN (hypertension) 07/06/2019   Hyperlipidemia    Hypokalemia 03/16/2021   Snoring 03/16/2021    Blood pressure (!) 181/93, pulse 60.  HTN (hypertension) Assessment: BP is uncontrolled in office BP 181/93 mmHg;  above the goal (<130/80). Home averages 150/83 (AM) and 147/82 (PM) Tolerates HCTZ well without any side effects Denies SOB, palpitation, chest pain, headaches,or swelling Stopped carvedilol  2/2 breathing issues Reiterated the importance of regular exercise and low salt diet   Plan:  Start taking spironolactone 25 mg once daily Continue taking HCTZ 12.5 mg once daily Patient to keep record of BP readings with heart rate and report to us  at the next visit Patient to follow up in 2 months with me, 4-5 months with Dr. Lacie Piano ordered today:  BMET - 2 weeks   Donivan Furry PharmD CPP [facility] [address] [place], [place] [address]

## 2024-04-17 NOTE — Patient Instructions (Signed)
 Follow up appointment: Wednesday June 18 at 10 am  IF YOU HAVE ISSUES PLEASE CALL ME [phone] AND ASK THE OPERATOR TO SPEAK WITH Katie Potts  SCHEDULE APPOINTMENT TO SEE DR. [place] IN AUGUST  Go to the lab in 2 WEEKS TO CHECK KIDNEY LABS  Take your BP meds as follows:  START SPIRONOLACTONE 25 MG ONCE DAILY  CONTINUE WITH HYDROCHLOROTHIAZIDE  12.5 MG ONCE DAILY  Check your blood pressure at home daily (if able) and keep record of the readings.  Your blood pressure goal is < 130/80  To check your pressure at home you will need to:  1. Sit up in a chair, with feet flat on the floor and back supported. Do not cross your ankles or legs. 2. Rest your left arm so that the cuff is about heart level. If the cuff goes on your upper arm,  then just relax the arm on the table, arm of the chair or your lap. If you have a wrist cuff, we  suggest relaxing your wrist against your chest (think of it as Pledging the Flag with the  wrong arm).  3. Place the cuff snugly around your arm, about 1 inch above the crook of your elbow. The  cords should be inside the groove of your elbow.  4. Sit quietly, with the cuff in place, for about 5 minutes. After that 5 minutes press the power  button to start a reading. 5. Do not talk or move while the reading is taking place.  6. Record your readings on a sheet of paper. Although most cuffs have a memory, it is often  easier to see a pattern developing when the numbers are all in front of you.  7. You can repeat the reading after 1-3 minutes if it is recommended  Make sure your bladder is empty and you have not had caffeine or tobacco within the last 30 min  Always bring your blood pressure log with you to your appointments. If you have not brought your monitor in to be double checked for accuracy, please bring it to your next appointment.  You can find a list of quality blood pressure cuffs at [email or web address]  Important lifestyle changes to control  high blood pressure  Intervention  Effect on the BP  Lose extra pounds and watch your waistline Weight loss is one of the most effective lifestyle changes for controlling blood pressure. If you're overweight or obese, losing even a small amount of weight can help reduce blood pressure. Blood pressure might go down by about 1 millimeter of mercury (mm Hg) with each kilogram (about 2.2 pounds) of weight lost.  Exercise regularly As a general goal, aim for at least 30 minutes of moderate physical activity every day. Regular physical activity can lower high blood pressure by about 5 to 8 mm Hg.  Eat a healthy diet Eating a diet rich in whole grains, fruits, vegetables, and low-fat dairy products and low in saturated fat and cholesterol. A healthy diet can lower high blood pressure by up to 11 mm Hg.  Reduce salt (sodium) in your diet Even a small reduction of sodium in the diet can improve heart health and reduce high blood pressure by about 5 to 6 mm Hg.  Limit alcohol One drink equals 12 ounces of beer, 5 ounces of wine, or 1.5 ounces of 80-proof liquor.  Limiting alcohol to less than one drink a day for women or two drinks a day for men can help lower blood  pressure by about 4 mm Hg.   If you have any questions or concerns please use My Chart to send questions or call the office at [phone]

## 2024-04-20 ENCOUNTER — Telehealth: Payer: Self-pay | Admitting: Cardiovascular Disease

## 2024-04-20 NOTE — Telephone Encounter (Signed)
 Pt c/o medication issue:  1. Name of Medication:   Evolocumab  (REPATHA  SURECLICK) 140 MG/ML SOAJ    2. How are you currently taking this medication (dosage and times per day)?    3. Are you having a reaction (difficulty breathing--STAT)? no  4. What is your medication issue? Pharmacy is trying to charge a copay for medication. But patient states it suppose to be free. Please advise

## 2024-04-22 ENCOUNTER — Other Ambulatory Visit: Payer: Self-pay | Admitting: Cardiology

## 2024-04-23 NOTE — Telephone Encounter (Signed)
 Spoke with pharmacy.  They have [employer] information, just forgot to bill.  Explained to patient that if this happens again, she should just remind them to bill the grant.   Patient voiced understanding

## 2024-04-27 ENCOUNTER — Other Ambulatory Visit: Payer: Self-pay | Admitting: Nurse Practitioner

## 2024-04-27 DIAGNOSIS — E2839 Other primary ovarian failure: Secondary | ICD-10-CM

## 2024-05-04 DIAGNOSIS — I10 Essential (primary) hypertension: Secondary | ICD-10-CM | POA: Diagnosis not present

## 2024-05-05 LAB — BASIC METABOLIC PANEL WITH GFR
BUN/Creatinine Ratio: 22 (ref 12–28)
BUN: 33 mg/dL — ABNORMAL HIGH (ref 8–27)
CO2: 25 mmol/L (ref 20–29)
Calcium: 10.2 mg/dL (ref 8.7–10.3)
Chloride: 98 mmol/L (ref 96–106)
Creatinine, Ser: 1.51 mg/dL — ABNORMAL HIGH (ref 0.57–1.00)
Glucose: 91 mg/dL (ref 70–99)
Potassium: 4.1 mmol/L (ref 3.5–5.2)
Sodium: 142 mmol/L (ref 134–144)
eGFR: 37 mL/min/{1.73_m2} — ABNORMAL LOW (ref >59)

## 2024-05-07 ENCOUNTER — Ambulatory Visit (HOSPITAL_BASED_OUTPATIENT_CLINIC_OR_DEPARTMENT_OTHER): Payer: Self-pay | Admitting: Cardiovascular Disease

## 2024-05-07 ENCOUNTER — Telehealth: Payer: Self-pay | Admitting: *Deleted

## 2024-05-07 DIAGNOSIS — N289 Disorder of kidney and ureter, unspecified: Secondary | ICD-10-CM

## 2024-05-07 DIAGNOSIS — I10 Essential (primary) hypertension: Secondary | ICD-10-CM

## 2024-05-07 NOTE — Progress Notes (Signed)
 Complex Care Management Care Guide Note  05/07/2024 Name: [name] MRN: [id number] DOB: [birth year]  Katie Potts is a 73 y.o. year old female who is a primary care patient of [name], FNP and is actively engaged with the care management team. I reached out to Amelia Jurist by phone today to assist with re-scheduling  with the RN Case Manager.  Follow up plan: Unsuccessful telephone outreach attempt made. A HIPAA compliant phone message was left for the patient providing contact information and requesting a return call.  Barnie Bora  [facility]  [facility], [facility] Guide  Direct Dial: [phone]  Fax [phone]

## 2024-05-07 NOTE — Progress Notes (Signed)
 Complex Care Management Care Guide Note  05/07/2024 Name: Soledad Budreau MRN: [id number] DOB: 1951/01/11  Rolonda Pontarelli is a 74 y.o. year old female who is a primary care patient of [name], FNP and is actively engaged with the care management team. I reached out to Amelia Jurist by phone today to assist with re-scheduling  with the RN Case Manager.  Follow up plan: Telephone appointment with complex care management team member scheduled for:  06/08/24  Barnie Bora  [facility]  [facility], [facility] Guide  Direct Dial: [phone]  Fax [phone]

## 2024-05-08 ENCOUNTER — Other Ambulatory Visit: Payer: Medicare Other

## 2024-05-08 MED ORDER — HYDRALAZINE HCL 50 MG PO TABS: 50.0000 mg | ORAL_TABLET | Freq: Two times a day (BID) | ORAL | 1 refills | Status: DC

## 2024-05-08 NOTE — Telephone Encounter (Signed)
-----   Message from [facility] sent at 05/07/2024  4:44 PM EDT ----- Kidney function is going in the wrong direction.  Stop spironolactone .  Recommend hydralazine 50mg  bid.  Repeat BMP in a week

## 2024-05-08 NOTE — Telephone Encounter (Signed)
 Advised patient of lab results and medication changes Patient verbalized understanding

## 2024-05-15 ENCOUNTER — Other Ambulatory Visit: Payer: Self-pay

## 2024-05-15 ENCOUNTER — Other Ambulatory Visit (HOSPITAL_COMMUNITY): Payer: Self-pay

## 2024-05-15 ENCOUNTER — Telehealth: Payer: Self-pay

## 2024-05-15 NOTE — Telephone Encounter (Signed)
 Pharmacy Patient Advocate Encounter  Received notification from [employer] that Prior Authorization for NEXLETOL  has been APPROVED from 12/21/23 to 12/19/24

## 2024-05-15 NOTE — Telephone Encounter (Signed)
 Pharmacy Patient Advocate Encounter   Received notification from CoverMyMeds that prior authorization for NEXLETOL  is required/requested.   Insurance verification completed.   The patient is insured through [facility] .   Per test claim: PA required; PA submitted to above mentioned insurance via CoverMyMeds Key/confirmation #/EOC [id number] Status is pending

## 2024-05-24 ENCOUNTER — Ambulatory Visit: Payer: Self-pay | Admitting: Licensed Clinical Social Worker

## 2024-05-28 NOTE — Patient Instructions (Signed)
 Visit Information  Thank you for taking time to visit with me today. Please don't hesitate to contact me if I can be of assistance to you before our next scheduled appointment.  Your next care management appointment is by telephone on 07/3 at 11 AM  Please call the care guide team at [phone] if you need to cancel, schedule, or reschedule an appointment.   Please call the Suicide and Crisis Lifeline: 988 go to [facility] [address] [place] [phone]) call 911 if you are experiencing a Mental Health or Behavioral Health Crisis or need someone to talk to.  Alease Hunter, LCSW [place]  [facility], [facility] Clinical Social Worker Direct Dial: [phone]  Fax: [phone] Website: [email or web address] 9:41 AM

## 2024-05-28 NOTE — Patient Outreach (Signed)
 Complex Care Management   Visit Note  05/24/2024  Name:  Katie Potts MRN: [id number] DOB: August 24, 1951  Situation: Referral received for Complex Care Management related to Mental/Behavioral Health diagnosis Grief I obtained verbal consent from Patient.  Visit completed with pt  on the phone  Background:   Past Medical History:  Diagnosis Date   Asthma    Chest pain of uncertain etiology 03/16/2021   Depression    MAJOR DEPRESSION, DR. KAUR;PSYCHIATRIST   HTN (hypertension) 07/06/2019   Hyperlipidemia    Hypokalemia 03/16/2021   Snoring 03/16/2021    Assessment: Patient Reported Symptoms:  Cognitive Cognitive Status: Alert and oriented to person, place, and time, Normal speech and language skills Cognitive/Intellectual Conditions Management [RPT]: None reported or documented in medical history or problem list      Neurological Neurological Review of Symptoms: No symptoms reported    HEENT HEENT Symptoms Reported: No symptoms reported      Cardiovascular Cardiovascular Symptoms Reported: No symptoms reported    Respiratory Respiratory Symptoms Reported: No symptoms reported    Endocrine Patient reports the following symptoms related to hypoglycemia or hyperglycemia : No symptoms reported    Gastrointestinal Gastrointestinal Symptoms Reported: No symptoms reported      Genitourinary Genitourinary Symptoms Reported: No symptoms reported    Integumentary Integumentary Symptoms Reported: No symptoms reported    Musculoskeletal Musculoskelatal Symptoms Reviewed: No symptoms reported        Psychosocial Psychosocial Symptoms Reported: No symptoms reported Additional Psychological Details: Patient is doing well staying motivated and identifying strategies to assist with promoting mood and well-being Behavioral Health Conditions: Other Other Behavorial Health Conditions: Grief Behavioral Management Strategies: Adequate rest, Support system, Coping strategies, Exercise Major  Change/Loss/Stressor/Fears (CP): Death of a loved one Quality of Family Relationships: involved Do you feel physically threatened by others?: No      08/24/2023    8:57 AM  Depression screen PHQ 2/9  Decreased Interest 3  Down, Depressed, Hopeless 3  PHQ - 2 Score 6  Altered sleeping 3  Tired, decreased energy 3  Change in appetite 3  Feeling bad or failure about yourself  3  Trouble concentrating 3  Moving slowly or fidgety/restless 0  Suicidal thoughts 0  PHQ-9 Score 21  Difficult doing work/chores Very difficult    There were no vitals filed for this visit.  Medications Reviewed Today     Reviewed by Adriana Albany, LCSW (Social Worker) on 05/24/24 at 1155  Med List Status: <None>   Medication Order Taking? Sig Documenting Provider Last Dose Status Informant  albuterol  (VENTOLIN  HFA) 108 (90 Base) MCG/ACT inhaler [id number] Yes Inhale 2 puffs into the lungs every 6 (six) hours as needed for wheezing or shortness of breath. [name], FNP Taking Active   aspirin EC 81 MG tablet [id number] Yes Take 81 mg by mouth daily. [provider] Taking Active   Bempedoic Acid  (NEXLETOL ) 180 MG TABS [id number] Yes Take 1 tablet (180 mg total) by mouth daily. Maudine Sos, MD Taking Active   carvedilol  (COREG ) 6.25 MG tablet [id number] No Take 1 tablet (6.25 mg total) by mouth 2 (two) times daily.  Patient not taking: Reported on 05/24/2024   Maudine Sos, MD Not Taking Active   clonazePAM (KLONOPIN) 1 MG tablet [id number] Yes Take 1 mg by mouth 2 (two) times daily. 1/2 tablet by mouth up to twice daily as needed for anxiety or panic attacks. [provider] Taking Active   diphenhydrAMINE  HCl (BENADRYL  ALLERGY PO) [id number] Yes  Take 2 tablets by mouth as needed. [provider] Taking Active   docusate sodium  (COLACE) 100 MG capsule [id number] No TAKE 1 CAPSULE(100 MG) BY MOUTH DAILY AS NEEDED  Patient not taking: Reported on 05/24/2024   [name], FNP  Not Taking Active   Evolocumab  (REPATHA  SURECLICK) 140 MG/ML SOAJ [id number] Yes Inject 140 mg into the skin every 14 (fourteen) days. Maudine Sos, MD Taking Active   FLUoxetine (PROZAC) 20 MG capsule [id number] Yes Take 40 mg by mouth every morning. [provider] Taking Active   hydrALAZINE  (APRESOLINE ) 50 MG tablet [id number] Yes Take 1 tablet (50 mg total) by mouth 2 (two) times daily. Maudine Sos, MD Taking Active   hydrochlorothiazide  (HYDRODIURIL ) 12.5 MG tablet [id number] Yes Take 1 tablet (12.5 mg total) by mouth daily. [name], FNP Taking Active   lubiprostone  (AMITIZA ) 8 MCG capsule [id number] Yes Take 1 capsule (8 mcg total) by mouth 2 (two) times daily with a meal. [name], FNP Taking Active   zolpidem (AMBIEN) 10 MG tablet [id number] Yes Take 10 mg by mouth at bedtime as needed for sleep. [provider] Taking Active             Recommendation:   Continue Current Plan of Care  Follow Up Plan:   Telephone follow-up 2-4 weeks  Alease Hunter, LCSW [place]  [facility], [facility] Clinical Social Worker Direct Dial: [phone]  Fax: [phone] Website: [email or web address] 9:40 AM

## 2024-05-31 DIAGNOSIS — N289 Disorder of kidney and ureter, unspecified: Secondary | ICD-10-CM | POA: Diagnosis not present

## 2024-05-31 DIAGNOSIS — I10 Essential (primary) hypertension: Secondary | ICD-10-CM | POA: Diagnosis not present

## 2024-06-05 NOTE — Progress Notes (Signed)
 06/06/2024 Katie Potts Apr 06, 1951 [id number]   HPI:  Katie Potts is a 73 y.o. female patient of Dr Theodis Fiscal, with a PMH below who presents today for hypertension management.  She had [facility] been [facility], but now is following up with Dr. Theodis Fiscal as her primary cardiologist, as Dr. Felipe Horton has retired.  When she was seen in August, her primary concern was dizziness and nausea that would come on without warning.  Occurred once while washing dishes, had to lie down on the floor and was unable to get to her bedroom.  She also reported chronic fatigue and intermittent racing palpitations.  Home BP readings were 170 or higher systolic consistently.  I saw her in October, at which time she was stressed because her sister was switching care facilities and patient was not able to be with her.  BP that day was 183/79.  We did not change any medications that day and she was asked to continue monitoring. At follow up in February she had discontinued valsartan , thinking it was causing her headaches.  After 3 weeks they were less severe, but still occurring.  We had her start carvedilol  6.25 mg twice daily.   Most recently we tried spironolactone , but had to stop because of rise in SCr.  She has now been on hydralazine  50 mg bid for about a month  Today she returns for follow up.  She brings her home cuff in today for validation - it reads > 20 points higher systolic.   She has been doing well overall, but complains of some SOB when out with friend, suspects due to the humidity.  She also reported some episodes of palpitations, thinks may be related to dehydration and changes in diet because of multiple birthday celebrations.   Notes that it is not as frequent this week and she is trying to keep hydrated.   She is also asking about medications for weight management and whether they would be beneficial for  her.    Past Medical History: ASCVD CAC score 73 (80th percentile), prior TIA  hyperlipidemia LDL at 124 (10/21) on Repatha , Nexletol   Diastolic HF chronic     Blood Pressure Goal:  130/80  Current Medications: hctz 12.5 mg every day, hydralazine  50 mg bid  Family Hx: mother died at 18 cancer, dad at 2 stroke, both with hypertension, 4 siblings, brother died form MI at 45, others all with hypertension, 1 son, now deceased,   Social Hx: no tobacco, occasional alcohol, drinks occasional decaf coffee  Diet: tries to eat home cooked, but travelling a lot to sister in [place], plenty of chicken (rotissire all week) with vegetables (frozen), occasional rice;  doesn't snack much   Exercise: not currently, tried walking about 5 minutes, felt tired. Moving about more, decorating in the house  Home BP readings:     17 AM readings average 150/83  14 PM readings average 147/83  Intolerances: amlodipine  -headaches, edema;  losartan  - myalgias  olmesartan didn't work  carvedilol  - asthma flare Spironolactone  - increase in SCr  Labs: 12/24:  Na 140, K 3.9, Glu 90, SCr 1.15, GFR 51   Wt Readings from Last 3 Encounters:  06/06/24 157 lb (71.2 kg)  11/30/23 160 lb 3.2 oz (72.7 kg)  09/15/23 155 lb 6.4 oz (70.5 kg)   BP Readings from Last 3 Encounters:  06/06/24 (!) 148/88  04/17/24 (!) 181/93  01/24/24 (!) 176/94   Pulse Readings from Last 3 Encounters:  06/06/24 86  04/17/24 60  01/24/24 78    Current Outpatient Medications  Medication Sig Dispense Refill   albuterol  (VENTOLIN  HFA) 108 (90 Base) MCG/ACT inhaler Inhale 2 puffs into the lungs every 6 (six) hours as needed for wheezing or shortness of breath. 18 g 3   aspirin EC 81 MG tablet Take 81 mg by mouth daily.     Bempedoic Acid  (NEXLETOL ) 180 MG TABS Take 1 tablet (180 mg total) by mouth daily. 30 tablet 11   clonazePAM (KLONOPIN) 1 MG tablet Take 1 mg by mouth 2 (two) times daily. 1/2 tablet by mouth up to twice daily as  needed for anxiety or panic attacks.     diphenhydrAMINE  HCl (BENADRYL  ALLERGY PO) Take 2 tablets by mouth as needed.     docusate sodium  (COLACE) 100 MG capsule TAKE 1 CAPSULE(100 MG) BY MOUTH DAILY AS NEEDED 30 capsule 1   Evolocumab  (REPATHA  SURECLICK) 140 MG/ML SOAJ Inject 140 mg into the skin every 14 (fourteen) days. 6 mL 3   FLUoxetine (PROZAC) 20 MG capsule Take 40 mg by mouth every morning.     hydrALAZINE  (APRESOLINE ) 50 MG tablet Take 1 tablet (50 mg total) by mouth 2 (two) times daily. 180 tablet 1   hydrochlorothiazide  (HYDRODIURIL ) 12.5 MG tablet Take 1 tablet (12.5 mg total) by mouth daily. 90 tablet 1   lubiprostone  (AMITIZA ) 8 MCG capsule Take 1 capsule (8 mcg total) by mouth 2 (two) times daily with a meal. 60 capsule 2   NIFEdipine (PROCARDIA-XL/NIFEDICAL-XL) 30 MG 24 hr tablet Take 1 tablet (30 mg total) by mouth daily. 30 tablet 3   zolpidem (AMBIEN) 10 MG tablet Take 10 mg by mouth at bedtime as needed for sleep.     carvedilol  (COREG ) 6.25 MG tablet Take 1 tablet (6.25 mg total) by mouth 2 (two) times daily. (Patient not taking: Reported on 06/06/2024) 180 tablet 3   No current facility-administered medications for this visit.    Allergies  Allergen Reactions   Amlodipine  Swelling and Other (See Comments)   Losartan      myalgias   Pravastatin     myalgias   Rosuvastatin      myalgias   Seroquel [Quetiapine Fumarate] Swelling   Spironolactone      Abnormal kidney functions     Past Medical History:  Diagnosis Date   Asthma    Chest pain of uncertain etiology 03/16/2021   Depression    MAJOR DEPRESSION, DR. KAUR;PSYCHIATRIST   HTN (hypertension) 07/06/2019   Hyperlipidemia    Hypokalemia 03/16/2021   Snoring 03/16/2021    Blood pressure (!) 148/88, pulse 86, height 5' 1 (1.549 m), weight 157 lb (71.2 kg), SpO2 96%.  HTN (hypertension) Assessment: BP is uncontrolled in office BP 148/88 mmHg;  above the goal (<130/80). Tolerates hctz and hydralazine  well,  without any side effects Denies chest pain, headaches,or swelling Some SOB and palpitations - notes they are not as bad, seem to be less frequent Reiterated the importance of regular exercise and low salt diet   Plan:  Discussed limited options (based on history of intolerances).  Will start nifedipine xl 30 mg once daily Stressed that she should continue taking hctz and hydralazine  Patient to keep record of BP readings with heart rate and report to us  at the next visit Patient to follow up with Dr. Theodis Fiscal in September  Labs ordered today:  none   Class 1 obesity due to excess calories with body mass index (BMI) of 30.0 to 30.9  in adult Patient has not met goal of at least 5% of body weight loss with comprehensive lifestyle modifications alone in the past 3-6 months. Pharmacotherapy is appropriate to pursue as augmentation. Will start Wegovy.   Confirmed patient not pregnant and no personal or family history of medullary thyroid  carcinoma (MTC) or Multiple Endocrine Neoplasia syndrome type 2 (MEN 2).   Advised patient on common side effects including nausea, diarrhea, dyspepsia, decreased appetite, and fatigue. Counseled patient on reducing meal size and how to titrate medication to minimize side effects. Patient aware to call if intolerable side effects or if experiencing dehydration, abdominal pain, or dizziness. Patient will adhere to dietary modifications and will target at least 150 minutes of moderate intensity exercise weekly.   Injection technique reviewed at today's visit.    Titration Plan:  Will plan to follow the titration plan as below, pending patient is tolerating each dose before increasing to the next. Can slow titration if needed for tolerability.   start with 0.25 mg once weekly for 4 weeks.  Patient aware she will need to call office to review symptoms/benefits for determination of dose increase vs continuation.    Follow up in 3 months.    Eleftherios Dudenhoeffer PharmD  CPP [facility] [address] [place], [place] [address]

## 2024-06-06 ENCOUNTER — Telehealth (HOSPITAL_BASED_OUTPATIENT_CLINIC_OR_DEPARTMENT_OTHER): Payer: Self-pay | Admitting: Pharmacist Clinician (PhC)/ Clinical Pharmacy Specialist

## 2024-06-06 ENCOUNTER — Ambulatory Visit (HOSPITAL_BASED_OUTPATIENT_CLINIC_OR_DEPARTMENT_OTHER): Admitting: Pharmacist Clinician (PhC)/ Clinical Pharmacy Specialist

## 2024-06-06 ENCOUNTER — Encounter (HOSPITAL_BASED_OUTPATIENT_CLINIC_OR_DEPARTMENT_OTHER): Payer: Self-pay | Admitting: Pharmacist Clinician (PhC)/ Clinical Pharmacy Specialist

## 2024-06-06 ENCOUNTER — Other Ambulatory Visit (HOSPITAL_COMMUNITY): Payer: Self-pay

## 2024-06-06 ENCOUNTER — Telehealth: Payer: Self-pay | Admitting: Pharmacy Technician

## 2024-06-06 VITALS — BP 148/88 | HR 86 | Ht 61.0 in | Wt 157.0 lb

## 2024-06-06 DIAGNOSIS — I10 Essential (primary) hypertension: Secondary | ICD-10-CM | POA: Diagnosis not present

## 2024-06-06 DIAGNOSIS — E66811 Obesity, class 1: Secondary | ICD-10-CM

## 2024-06-06 DIAGNOSIS — Z6830 Body mass index (BMI) 30.0-30.9, adult: Secondary | ICD-10-CM | POA: Diagnosis not present

## 2024-06-06 DIAGNOSIS — E6609 Other obesity due to excess calories: Secondary | ICD-10-CM

## 2024-06-06 MED ORDER — NIFEDIPINE ER OSMOTIC RELEASE 30 MG PO TB24: 30.0000 mg | ORAL_TABLET | Freq: Every day | ORAL | 3 refills | Status: DC

## 2024-06-06 NOTE — Assessment & Plan Note (Signed)
 Patient has not met goal of at least 5% of body weight loss with comprehensive lifestyle modifications alone in the past 3-6 months. Pharmacotherapy is appropriate to pursue as augmentation. Will start Wegovy.   Confirmed patient not pregnant and no personal or family history of medullary thyroid  carcinoma (MTC) or Multiple Endocrine Neoplasia syndrome type 2 (MEN 2).   Advised patient on common side effects including nausea, diarrhea, dyspepsia, decreased appetite, and fatigue. Counseled patient on reducing meal size and how to titrate medication to minimize side effects. Patient aware to call if intolerable side effects or if experiencing dehydration, abdominal pain, or dizziness. Patient will adhere to dietary modifications and will target at least 150 minutes of moderate intensity exercise weekly.   Injection technique reviewed at today's visit.    Titration Plan:  Will plan to follow the titration plan as below, pending patient is tolerating each dose before increasing to the next. Can slow titration if needed for tolerability.   start with 0.25 mg once weekly for 4 weeks.  Patient aware she will need to call office to review symptoms/benefits for determination of dose increase vs continuation.    Follow up in 3 months.

## 2024-06-06 NOTE — Telephone Encounter (Signed)
 Please try PA for [facility], pt has hx of TIA in 2020

## 2024-06-06 NOTE — Assessment & Plan Note (Signed)
 Assessment: BP is uncontrolled in office BP 148/88 mmHg;  above the goal (<130/80). Tolerates hctz and hydralazine  well, without any side effects Denies chest pain, headaches,or swelling Some SOB and palpitations - notes they are not as bad, seem to be less frequent Reiterated the importance of regular exercise and low salt diet   Plan:  Discussed limited options (based on history of intolerances).  Will start nifedipine xl 30 mg once daily Stressed that she should continue taking hctz and hydralazine  Patient to keep record of BP readings with heart rate and report to us  at the next visit Patient to follow up with Dr. Theodis Fiscal in September  Labs ordered today:  none

## 2024-06-06 NOTE — Patient Instructions (Signed)
 Follow up appointment: with Dr. Theodis Fiscal in September  Take your BP meds as follows:  START NIFEDIPINE XL 30 MG ONCE DAILY  CONTINUE WITH HYDROCHLOROTHIAZIDE  AND HYDRALAZINE   I WILL REACH OUT TO YOUR INSURANCE AND SEE IF WEGOVY WOULD BE COVERED ON THE PLAN.    Check your blood pressure at home daily (if able) and keep record of the readings.  Your blood pressure goal is <130/80  To check your pressure at home you will need to:  1. Sit up in a chair, with feet flat on the floor and back supported. Do not cross your ankles or legs. 2. Rest your left arm so that the cuff is about heart level. If the cuff goes on your upper arm,  then just relax the arm on the table, arm of the chair or your lap. If you have a wrist cuff, we  suggest relaxing your wrist against your chest (think of it as Pledging the Flag with the  wrong arm).  3. Place the cuff snugly around your arm, about 1 inch above the crook of your elbow. The  cords should be inside the groove of your elbow.  4. Sit quietly, with the cuff in place, for about 5 minutes. After that 5 minutes press the power  button to start a reading. 5. Do not talk or move while the reading is taking place.  6. Record your readings on a sheet of paper. Although most cuffs have a memory, it is often  easier to see a pattern developing when the numbers are all in front of you.  7. You can repeat the reading after 1-3 minutes if it is recommended  Make sure your bladder is empty and you have not had caffeine or tobacco within the last 30 min  Always bring your blood pressure log with you to your appointments. If you have not brought your monitor in to be double checked for accuracy, please bring it to your next appointment.  You can find a list of quality blood pressure cuffs at [email or web address]    TIPS FOR WEIGHT LOSS SUCCESS Write down the reasons why you want to lose weight and post it in a place where you'll see it often. Start small and work  your way up. Keep in mind that it takes time to achieve goals, and small steps add up. Any additional movements help to burn calories. Taking the stairs rather than the elevator and parking at the far end of your parking lot are easy ways to start. Brisk walking for at least 30 minutes 4 or more days of the week is an excellent goal to work toward  [employer] WHAT IT MEANS TO FEEL FULL Did you know that it can take 15 minutes or more for your brain to receive the message that you've eaten? That means that, if you eat less food, but consume it slower, you may still feel satisfied. Eating a lot of fruits and vegetables can also help you feel fuller. Eat off of smaller plates so that moderate portions don't seem too small  TITRATION PLAN Will plan to follow the titration plan as below, pending patient is tolerating each dose before increasing to the next. Can slow titration if needed for tolerability.   START WITH 0.25 MG ONCE WEEKLY, CALL ME AT [phone] AFTER THE 4TH DOSE AND WE WILL DETERMINE IF YOU NEED TO INCREASE OR STAY AT THIS DOSE  THANK YOU FOR CHOOSING [facility]

## 2024-06-06 NOTE — Telephone Encounter (Signed)
 Pharmacy Patient Advocate Encounter  Received notification from [facility] that Prior Authorization for Wegovy 0.25 MG has been APPROVED from 06/06/24 to 12/19/24. Ran test claim, Copay is $36.33- one month only. This test claim was processed through [facility]- copay amounts may vary at other pharmacies due to pharmacy/plan contracts, or as the patient moves through the different stages of their insurance plan.   PA #/Case ID/Reference #: [id number]

## 2024-06-06 NOTE — Telephone Encounter (Signed)
 Pharmacy Patient Advocate Encounter   Received notification from Pt Calls Messages that prior authorization for Wegovy 0.25 MG is required/requested.   Insurance verification completed.   The patient is insured through [employer] .   Per test claim: PA required; PA submitted to above mentioned insurance via CoverMyMeds Key/confirmation #/EOC [facility] Status is pending

## 2024-06-07 ENCOUNTER — Encounter: Payer: Self-pay | Admitting: Nurse Practitioner

## 2024-06-07 ENCOUNTER — Ambulatory Visit (INDEPENDENT_AMBULATORY_CARE_PROVIDER_SITE_OTHER): Payer: Medicare Other | Admitting: Nurse Practitioner

## 2024-06-07 VITALS — BP 120/80 | HR 89 | Temp 97.8°F | Ht 61.0 in | Wt 161.6 lb

## 2024-06-07 DIAGNOSIS — R0602 Shortness of breath: Secondary | ICD-10-CM

## 2024-06-07 DIAGNOSIS — G720 Drug-induced myopathy: Secondary | ICD-10-CM

## 2024-06-07 DIAGNOSIS — G8929 Other chronic pain: Secondary | ICD-10-CM

## 2024-06-07 DIAGNOSIS — R7309 Other abnormal glucose: Secondary | ICD-10-CM

## 2024-06-07 DIAGNOSIS — Z1231 Encounter for screening mammogram for malignant neoplasm of breast: Secondary | ICD-10-CM

## 2024-06-07 DIAGNOSIS — I5032 Chronic diastolic (congestive) heart failure: Secondary | ICD-10-CM

## 2024-06-07 DIAGNOSIS — Z2821 Immunization not carried out because of patient refusal: Secondary | ICD-10-CM | POA: Diagnosis not present

## 2024-06-07 DIAGNOSIS — E782 Mixed hyperlipidemia: Secondary | ICD-10-CM

## 2024-06-07 DIAGNOSIS — I70209 Unspecified atherosclerosis of native arteries of extremities, unspecified extremity: Secondary | ICD-10-CM

## 2024-06-07 DIAGNOSIS — Z79899 Other long term (current) drug therapy: Secondary | ICD-10-CM | POA: Diagnosis not present

## 2024-06-07 DIAGNOSIS — M5441 Lumbago with sciatica, right side: Secondary | ICD-10-CM | POA: Diagnosis not present

## 2024-06-07 DIAGNOSIS — I110 Hypertensive heart disease with heart failure: Secondary | ICD-10-CM

## 2024-06-07 DIAGNOSIS — T466X5A Adverse effect of antihyperlipidemic and antiarteriosclerotic drugs, initial encounter: Secondary | ICD-10-CM | POA: Diagnosis not present

## 2024-06-07 DIAGNOSIS — I10 Essential (primary) hypertension: Secondary | ICD-10-CM
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
## 2024-06-08 ENCOUNTER — Other Ambulatory Visit (HOSPITAL_COMMUNITY): Payer: Self-pay

## 2024-06-08 ENCOUNTER — Other Ambulatory Visit: Payer: Self-pay

## 2024-06-08 VITALS — BP 120/80 | HR 89 | Wt 161.0 lb

## 2024-06-08 DIAGNOSIS — I509 Heart failure, unspecified: Secondary | ICD-10-CM

## 2024-06-08 DIAGNOSIS — I10 Essential (primary) hypertension: Secondary | ICD-10-CM

## 2024-06-08 LAB — CBC WITH DIFFERENTIAL/PLATELET
Basophils Absolute: 0.1 10*3/uL (ref 0.0–0.2)
Basos: 2 %
EOS (ABSOLUTE): 0.1 10*3/uL (ref 0.0–0.4)
Eos: 2 %
Hematocrit: 41 % (ref 34.0–46.6)
Hemoglobin: 13.3 g/dL (ref 11.1–15.9)
Immature Grans (Abs): 0 10*3/uL (ref 0.0–0.1)
Immature Granulocytes: 0 %
Lymphocytes Absolute: 1.2 10*3/uL (ref 0.7–3.1)
Lymphs: 30 %
MCH: 29.6 pg (ref 26.6–33.0)
MCHC: 32.4 g/dL (ref 31.5–35.7)
MCV: 91 fL (ref 79–97)
Monocytes Absolute: 0.4 10*3/uL (ref 0.1–0.9)
Monocytes: 9 %
Neutrophils Absolute: 2.3 10*3/uL (ref 1.4–7.0)
Neutrophils: 57 %
Platelets: 272 10*3/uL (ref 150–450)
RBC: 4.5 x10E6/uL (ref 3.77–5.28)
RDW: 13.4 % (ref 11.7–15.4)
WBC: 4.1 10*3/uL (ref 3.4–10.8)

## 2024-06-08 LAB — TSH: TSH: 1.36 u[IU]/mL (ref 0.450–4.500)

## 2024-06-08 LAB — BRAIN NATRIURETIC PEPTIDE: BNP: 9.4 pg/mL (ref 0.0–100.0)

## 2024-06-08 MED ORDER — WEGOVY 0.25 MG/0.5ML ~~LOC~~ SOAJ: 0.2500 mg | SUBCUTANEOUS | 0 refills | Status: DC

## 2024-06-08 MED ORDER — WEGOVY 0.25 MG/0.5ML ~~LOC~~ SOAJ
0.2500 mg | SUBCUTANEOUS | 0 refills | Status: DC
  Filled 2024-06-08: qty 2, 28d supply, fill #0

## 2024-06-08 NOTE — Addendum Note (Signed)
 Addended by: Sunny English on: 06/08/2024 12:22 PM   Modules accepted: Orders

## 2024-06-08 NOTE — Addendum Note (Signed)
 Addended by: Sunny English on: 06/08/2024 12:15 PM   Modules accepted: Orders

## 2024-06-08 NOTE — Patient Instructions (Signed)
 Visit Information Visit Information  Thank you for taking time to visit with me today. Please don't hesitate to contact me if I can be of assistance to you before our next scheduled appointment.  Our next appointment is by telephone on Tuesday, July 29 at 10:30 AM Please call the care guide team at [phone] if you need to cancel or reschedule your appointment.   Following is a copy of your care plan:   Goals Addressed             This Visit's Progress    VBCI RN Care Plan related to Essential Hypertension and Heart Failure       Problems:  Chronic Disease Management support and education needs related to CHF and HTN  Goal: Over the next 90 days the Patient will continue to work with Medical illustrator and/or Social Worker to address care management and care coordination needs related to CHF and HTN as evidenced by adherence to care management team scheduled appointments      Interventions:   Heart Failure Interventions: Basic overview and discussion of pathophysiology of Heart Failure reviewed Provided education on low sodium diet Reviewed Heart Failure Action Plan in depth and provided written copy Assessed need for readable accurate scales in home Provided education about placing scale on hard, flat surface Advised patient to weigh each morning after emptying bladder Discussed importance of daily weight and advised patient to weigh and record daily Reviewed role of diuretics in prevention of fluid overload and management of heart failure; Discussed the importance of keeping all appointments with provider  Hypertension Interventions: Last practice recorded BP readings:  BP Readings from Last 3 Encounters:  06/08/24 120/80  06/07/24 120/80  06/06/24 (!) 148/88   Most recent eGFR/CrCl:  Lab Results  Component Value Date   EGFR 52 (L) 05/31/2024    No components found for: CRCL  Evaluation of current treatment plan related to hypertension self management and  patient's adherence to plan as established by provider Reviewed medications with patient and discussed importance of compliance Advised patient, providing education and rationale, to monitor blood pressure daily and record, calling PCP for findings outside established parameters  Patient Self-Care Activities:  Attend all scheduled provider appointments Call pharmacy for medication refills 3-7 days in advance of running out of medications Call provider office for new concerns or questions  Take medications as prescribed   Work with the social worker to address care coordination needs and will continue to work with the clinical team to address health care and disease management related needs call office if I gain more than 2 pounds in one day or 5 pounds in one week use salt in moderation watch for swelling in feet, ankles and legs every day weigh myself daily check blood pressure 3 times per week keep a blood pressure log take blood pressure log to all doctor appointments call doctor for signs and symptoms of high blood pressure keep all doctor appointments take medications for blood pressure exactly as prescribed report new symptoms to your doctor  Plan:  Follow up with Cardiology provider re: weight gain of 2 pounds in one day or 5 pounds in one week Telephone follow up appointment with care management team member scheduled for: Tuesday, July 29 at 10:30 AM             Please call 1-800-273-TALK (toll free, 24 hour hotline) if you are experiencing a Mental Health or Behavioral Health Crisis or need someone to talk to.  The patient verbalized understanding of instructions, educational materials, and care plan provided today and DECLINED offer to receive copy of patient instructions, educational materials, and care plan.   Louanne Roussel RN BSN CCM   [facility], [facility] Health Nurse Care Coordinator  Direct Dial: [phone] Website:  [email or web address]

## 2024-06-08 NOTE — Telephone Encounter (Signed)
 Does not have transportation and declines delivery services. Requests it be sent to [facility]

## 2024-06-08 NOTE — Patient Outreach (Addendum)
 Complex Care Management   Visit Note  06/08/2024  Name:  Katie Potts MRN: [id number] DOB: [birth year]  Situation: Referral received for Complex Care Management related to Heart Failure and Palpitations, Constipation, Obesity, Sciatica pain, Asthma, stage 2 CKD, Essential Hypertension. I obtained verbal consent from Patient.  Visit completed with patient on the phone.   Background:   Past Medical History:  Diagnosis Date   Asthma    Chest pain of uncertain etiology 03/16/2021   Depression    MAJOR DEPRESSION, DR. KAUR;PSYCHIATRIST   HTN (hypertension) 07/06/2019   Hyperlipidemia    Hypokalemia 03/16/2021   Snoring 03/16/2021    Assessment: Patient Reported Symptoms:  Cognitive Cognitive Status: Alert and oriented to person, place, and time, Difficulties with attention and concentration Cognitive/Intellectual Conditions Management [RPT]: None reported or documented in medical history or problem list   Health Maintenance Behaviors: Annual physical exam  Neurological Neurological Review of Symptoms: Dizziness Neurological Conditions:  (patient reports being diagnosed with having a TIA in 2020) Neurological Management Strategies: Routine screening, Exercise, Medication therapy Neurological Self-Management Outcome: 3 (uncertain)  HEENT HEENT Symptoms Reported: No symptoms reported      Cardiovascular Cardiovascular Symptoms Reported: Palpitations Does patient have uncontrolled Hypertension?: No Cardiovascular Conditions: Dysrhythmia, Hypertension, Heart failure (Atherosclerotic peripheral vascular disease) Cardiovascular Management Strategies: Medication therapy, Routine screening, Exercise, Diet modification, Medical device, Weight management Do You Have a Working Readable Scale?: Yes Weight: 161 lb (73 kg) Cardiovascular Self-Management Outcome: 4 (good)  Respiratory Respiratory Symptoms Reported: Shortness of breath Other Respiratory Symptoms: experiences shortness of breath  during humid temperatures, education provided Respiratory Conditions: Asthma Respiratory Self-Management Outcome: 4 (good)  Endocrine Patient reports the following symptoms related to hypoglycemia or hyperglycemia : No symptoms reported Is patient diabetic?: No    Gastrointestinal Gastrointestinal Symptoms Reported: Constipation, Obesity Gastrointestinal Conditions: Constipation Gastrointestinal Management Strategies: Medication therapy Gastrointestinal Self-Management Outcome: 4 (good) Gastrointestinal Comment: Magnesium Citrate, new Rx for [facility] approved and patient will start in the near future to help with Obesity Nutrition Risk Screen (CP): No indicators present  Genitourinary Genitourinary Symptoms Reported: No symptoms reported Genitourinary Conditions: Chronic kidney disease Genitourinary Management Strategies: Fluid modification Genitourinary Self-Management Outcome: 4 (good)  Integumentary Integumentary Symptoms Reported: Other Other Integumentary Symptoms: left second big toe, toenail cracked and removed Skin Management Strategies: Routine screening Skin Self-Management Outcome: 4 (good)  Musculoskeletal Musculoskelatal Symptoms Reviewed: Other Other Musculoskeletal Symptoms: sciatica pain Musculoskeletal Conditions: Mobility limited, Osteoarthritis Musculoskeletal Management Strategies: Routine screening Musculoskeletal Self-Management Outcome: 3 (uncertain) Musculoskeletal Comment: Patient is scheduled to start outpatient PT Falls in the past year?: No Number of falls in past year: 1 or less Was there an injury with Fall?: No Fall Risk Category Calculator: 0 Patient Fall Risk Level: Low Fall Risk Patient at Risk for Falls Due to: Impaired balance/gait, Impaired mobility Fall risk Follow up: Falls evaluation completed, Education provided  Psychosocial Psychosocial Symptoms Reported: Depression - if selected complete PHQ 2-9 Behavioral Health Conditions:  Depression Other Behavorial Health Conditions: Grief over loss of son, depression over sister who had a stroke Behavioral Health Self-Management Outcome: 4 (good) Major Change/Loss/Stressor/Fears (CP): Medical condition, family, Death of a loved one Techniques to Cope with Loss/Stress/Change: Medication, Counseling (patient is established with Alease Hunter LCSW) Quality of Family Relationships: involved, supportive, helpful Do you feel physically threatened by others?: No      06/08/2024   12:39 PM  Depression screen PHQ 2/9  Decreased Interest 3  Down, Depressed, Hopeless 3  PHQ - 2 Score 6  Altered  sleeping 3  Tired, decreased energy 3  Change in appetite 3  Feeling bad or failure about yourself  3  Trouble concentrating 3  Moving slowly or fidgety/restless 3  Suicidal thoughts 3  PHQ-9 Score 27  Difficult doing work/chores Somewhat difficult    Vitals:   06/08/24 1155  BP: 120/80  Pulse: 89    Medications Reviewed Today     Reviewed by Kaylene Pascal, RN (Registered Nurse) on 06/08/24 at 1249  Med List Status: <None>   Medication Order Taking? Sig Documenting Provider Last Dose Status Informant  albuterol  (VENTOLIN  HFA) 108 (90 Base) MCG/ACT inhaler [id number] Yes Inhale 2 puffs into the lungs every 6 (six) hours as needed for wheezing or shortness of breath. [name], FNP  Active   aspirin EC 81 MG tablet [id number] Yes Take 81 mg by mouth daily. [provider]  Active   Bempedoic Acid  (NEXLETOL ) 180 MG TABS [id number] Yes Take 1 tablet (180 mg total) by mouth daily. Maudine Sos, MD  Active   carvedilol  (COREG ) 6.25 MG tablet [id number]  Take 1 tablet (6.25 mg total) by mouth 2 (two) times daily.  Patient not taking: Reported on 06/08/2024   Maudine Sos, MD  Active   clonazePAM (KLONOPIN) 1 MG tablet [id number] Yes Take 1 mg by mouth 2 (two) times daily. 1/2 tablet by mouth up to twice daily as needed for anxiety or panic attacks. [provider]  Active   diphenhydrAMINE  HCl (BENADRYL  ALLERGY PO) [id number] Yes Take 2 tablets by mouth as needed. [provider]  Active   docusate sodium  (COLACE) 100 MG capsule [id number]  TAKE 1 CAPSULE(100 MG) BY MOUTH DAILY AS NEEDED  Patient not taking: Reported on 06/08/2024   [name], FNP  Active   Evolocumab  (REPATHA  SURECLICK) 140 MG/ML [place] [id number] Yes Inject 140 mg into the skin every 14 (fourteen) days. Maudine Sos, MD  Active   FLUoxetine (PROZAC) 20 MG capsule [id number] Yes Take 40 mg by mouth every morning.  Patient taking differently: Take 20 mg by mouth 2 (two) times daily.   [provider]  Active   hydrALAZINE  (APRESOLINE ) 50 MG tablet [id number] Yes Take 1 tablet (50 mg total) by mouth 2 (two) times daily. Maudine Sos, MD  Active   hydrochlorothiazide  (HYDRODIURIL ) 12.5 MG tablet [id number] Yes Take 1 tablet (12.5 mg total) by mouth daily. [name], FNP  Active   lubiprostone  (AMITIZA ) 8 MCG capsule [id number]  Take 1 capsule (8 mcg total) by mouth 2 (two) times daily with a meal. [name], FNP  Consider Medication Status and Discontinue (Patient Preference)   NIFEdipine (PROCARDIA-XL/NIFEDICAL-XL) 30 MG 24 hr tablet [id number] Yes Take 1 tablet (30 mg total) by mouth daily. Maudine Sos, MD  Active   Semaglutide-Weight Management [facility]) 0.25 MG/0.5ML [place] [id number] Yes Inject 0.25 mg into the skin once a week. Maudine Sos, MD  Active   zolpidem (AMBIEN) 10 MG tablet [id number] Yes Take 10 mg by mouth at bedtime as needed for sleep. [provider]  Active             Recommendation:   Specialty provider follow-up with outpatient PT as directed   Follow Up Plan:   Telephone follow up appointment date/time:  Tuesday, July 29 at 10:30 AM Referral to Care Guide  Louanne Roussel RN BSN CCM [facility]  [facility], [facility] Nurse Care Coordinator  Direct Dial:  [phone] Website: [email or web address]

## 2024-06-10 ENCOUNTER — Ambulatory Visit: Payer: Self-pay | Admitting: Nurse Practitioner

## 2024-06-10 DIAGNOSIS — R0602 Shortness of breath: Secondary | ICD-10-CM

## 2024-06-11 ENCOUNTER — Telehealth: Payer: Self-pay

## 2024-06-11 NOTE — Progress Notes (Signed)
   Telephone encounter was:  Unsuccessful.  06/11/2024 Name: Katie Potts MRN: [id number] DOB: August 22, 1951  Unsuccessful outbound call made today to assist with:  Food Insecurity and Financial Difficulties related to financial strain  Outreach Attempt:  1st Attempt  A HIPAA compliant voice message was left requesting a return call.  Instructed patient to call back    Jon Colt [facility] Guide, Phone: [phone] Fax: [phone] Website: [email or web address]

## 2024-06-11 NOTE — Progress Notes (Signed)
 When did she start having breast pain and which breast?  She did not mention during her visit.

## 2024-06-11 NOTE — Progress Notes (Signed)
 Let her know the diagnostic mammogram from last year mentioned the pain she was having is not a symptom of breast cancer, if this is the same pain we need to see about other causes. However if this does not work for her she will need an office visit for the breast pain as we need to document where she is having the pain.

## 2024-06-13 ENCOUNTER — Other Ambulatory Visit (HOSPITAL_COMMUNITY): Payer: Self-pay

## 2024-06-13 ENCOUNTER — Telehealth: Payer: Self-pay

## 2024-06-13 NOTE — Progress Notes (Signed)
   Telephone encounter was:  Successful.  Complex Care Management Note Care Guide Note  06/13/2024 Name: Katie Potts MRN: [id number] DOB: 1951-08-04  Katie Potts is a 73 y.o. year old female who is a primary care patient of Georgina Speaks, FNP . The community resource team was consulted for assistance with Food Insecurity and Financial Difficulties related to financial strain  SDOH screenings and interventions completed:  Yes  Social Drivers of Health From This Encounter   Housing: High Risk (06/13/2024)   Housing Stability Vital Sign    Unable to Pay for Housing in the Last Year: Yes    Homeless in the Last Year: Yes  Financial Resource Strain: High Risk (06/13/2024)   Overall Financial Resource Strain (CARDIA)    Difficulty of Paying Living Expenses: Very hard  Utilities: At Risk (06/13/2024)   Utilities    Threatened with loss of utilities: Yes    SDOH Interventions Today    Flowsheet Row Most Recent Value  SDOH Interventions   Housing Interventions Community Resources Provided, [id number] Referral  Utilities Interventions Community Resources Provided, [id number] Referral  Financial Strain Interventions Community Resources Provided, [facility] Referral     Care guide performed the following interventions: Patient provided with information about care guide support team and interviewed to confirm resource needs.Pt is having financial strain and cant afford bills and food. Pt requested commnuity resources for food financial and utilities for [facility] to be mailed to her   Follow Up Plan:  No further follow up planned at this time. The patient has been provided with needed resources.  Encounter Outcome:  Patient Visit Completed   Jon Colt [facility]  [facility] Guide, Phone: [phone] Fax: [phone] Website: [email or web address]

## 2024-06-14 ENCOUNTER — Encounter: Payer: Self-pay | Admitting: Nurse Practitioner

## 2024-06-14 ENCOUNTER — Other Ambulatory Visit: Payer: Self-pay | Admitting: Nurse Practitioner

## 2024-06-14 DIAGNOSIS — Z803 Family history of malignant neoplasm of breast: Secondary | ICD-10-CM

## 2024-06-14 DIAGNOSIS — N644 Mastodynia: Secondary | ICD-10-CM

## 2024-06-14 NOTE — Assessment & Plan Note (Signed)
 Declines shingrix, educated on disease process and is aware if he changes his mind to notify office

## 2024-06-14 NOTE — Assessment & Plan Note (Signed)

## 2024-06-14 NOTE — Assessment & Plan Note (Signed)
 Cholesterol levels are stable, will check lipid panel. Continue repatha due to intolerance to statins and zetia

## 2024-06-14 NOTE — Assessment & Plan Note (Signed)
 HgbA1c is stable, will check again today.

## 2024-06-14 NOTE — Assessment & Plan Note (Signed)
Continue f/u with Cardiology.

## 2024-06-14 NOTE — Assessment & Plan Note (Signed)
Continue Repatha.

## 2024-06-14 NOTE — Assessment & Plan Note (Signed)
 Will refer to PT. No abnormal findings on physical exam

## 2024-06-14 NOTE — Assessment & Plan Note (Signed)
 Blood pressure is controlled, continue current medications

## 2024-06-15 ENCOUNTER — Other Ambulatory Visit: Payer: Self-pay | Admitting: Nurse Practitioner

## 2024-06-15 DIAGNOSIS — Z803 Family history of malignant neoplasm of breast: Secondary | ICD-10-CM

## 2024-06-15 DIAGNOSIS — N644 Mastodynia: Secondary | ICD-10-CM

## 2024-06-21 ENCOUNTER — Other Ambulatory Visit: Payer: Self-pay | Admitting: Licensed Clinical Social Worker

## 2024-06-21 NOTE — Patient Instructions (Signed)
 Visit Information  Thank you for taking time to visit with me today. Please don't hesitate to contact me if I can be of assistance to you before our next scheduled appointment.  Your next care management appointment is by telephone on  at   Please call the care guide team at [phone] if you need to cancel, schedule, or reschedule an appointment.   Please call the Suicide and Crisis Lifeline: 988 go to [facility] Urgent [facility] [address] [place] [phone]) call 911 if you are experiencing a Mental Health or Behavioral Health Crisis or need someone to talk to.  Rolin Kerns, LCSW [place]  [facility], [facility] Clinical Social Worker Direct Dial: [phone]  Fax: [phone] Website: [email or web address] 11:21 AM

## 2024-06-23 ENCOUNTER — Other Ambulatory Visit: Payer: Self-pay | Admitting: Cardiovascular Disease

## 2024-06-25 ENCOUNTER — Other Ambulatory Visit (HOSPITAL_BASED_OUTPATIENT_CLINIC_OR_DEPARTMENT_OTHER)

## 2024-07-02 ENCOUNTER — Telehealth: Payer: Self-pay

## 2024-07-02 NOTE — Telephone Encounter (Signed)
 Copied from CRM [id number]. Topic: Clinical - Medication Question >> Jul 02, 2024 11:14 AM Carlyon D wrote: Reason for CRM: Pt called very confused on her future appointments for Mammogram and breast ultrasounds. Pt is states she has different days and times written down. She would like a call back from the office some time today to confirm these future appointments and pt is also confused why she has an appt scheduled in office for the 16th.  Spoke with patient she states she doesn't need her appt on 07/04/2024 because she already has diagnostic mammogram scheduled.

## 2024-07-04 ENCOUNTER — Other Ambulatory Visit: Payer: Self-pay | Admitting: Cardiovascular Disease

## 2024-07-04 ENCOUNTER — Ambulatory Visit: Payer: Self-pay | Admitting: Family Medicine

## 2024-07-04 DIAGNOSIS — E6609 Other obesity due to excess calories: Secondary | ICD-10-CM

## 2024-07-11 NOTE — Telephone Encounter (Signed)
 Spoke to patient who states she is doing well on Wegovy  0.25 mg.  States she is feeling really good the best she has felt in a long time.  She states that she has noticed appetite suppression.  But does make sure that she eats so that she does not feel weak.  Just a little bit of weight loss.  Interested in increasing to 0.5 mg.

## 2024-07-12 ENCOUNTER — Ambulatory Visit
Admission: RE | Admit: 2024-07-12 | Discharge: 2024-07-12 | Disposition: A | Discharge status: Home / Self Care | Source: Ambulatory Visit | Attending: Nurse Practitioner | Admitting: Nurse Practitioner

## 2024-07-12 ENCOUNTER — Ambulatory Visit

## 2024-07-12 ENCOUNTER — Ambulatory Visit: Admission: RE | Admit: 2024-07-12 | Source: Ambulatory Visit

## 2024-07-12 DIAGNOSIS — N644 Mastodynia: Secondary | ICD-10-CM | POA: Diagnosis not present

## 2024-07-12 DIAGNOSIS — R0602 Shortness of breath: Secondary | ICD-10-CM | POA: Diagnosis not present

## 2024-07-12 DIAGNOSIS — M47816 Spondylosis without myelopathy or radiculopathy, lumbar region: Secondary | ICD-10-CM | POA: Diagnosis not present

## 2024-07-12 DIAGNOSIS — G8929 Other chronic pain: Secondary | ICD-10-CM

## 2024-07-12 DIAGNOSIS — R92323 Mammographic fibroglandular density, bilateral breasts: Secondary | ICD-10-CM | POA: Diagnosis not present

## 2024-07-12 DIAGNOSIS — Z803 Family history of malignant neoplasm of breast: Secondary | ICD-10-CM

## 2024-07-12 DIAGNOSIS — M51362 Other intervertebral disc degeneration, lumbar region with discogenic back pain and lower extremity pain: Secondary | ICD-10-CM | POA: Diagnosis not present

## 2024-07-17 ENCOUNTER — Other Ambulatory Visit: Payer: Self-pay

## 2024-07-17 NOTE — Patient Instructions (Signed)
 Visit Information  Thank you for taking time to visit with me today. Please don't hesitate to contact me if I can be of assistance to you before our next scheduled appointment.  Your next care management appointment is by telephone on Tuesday, August 26 at 12:15 PM  Please call the care guide team at [phone] if you need to cancel, schedule, or reschedule an appointment.   Please call 1-800-273-TALK (toll free, 24 hour hotline) if you are experiencing a Mental Health or Behavioral Health Crisis or need someone to talk to.  Clayborne Ly RN BSN CCM [place]  [facility], [facility] Health Nurse Care Coordinator  Direct Dial: [phone] Website: [email or web address]

## 2024-07-17 NOTE — Patient Outreach (Addendum)
 Complex Care Management   Visit Note  07/17/2024  Name:  Katie Potts MRN: [id number] DOB: 11/07/1951  Situation: Referral received for Complex Care Management related to Heart Failure and Palpitations, Constipation, Class 1 Obesity, Sciatica pain, Asthma, stage 2 CKD, Essential Hypertension, bilateral breast pain. I obtained verbal consent from Patient.  Visit completed with patient on the phone.  Background:   Past Medical History:  Diagnosis Date   Asthma    Chest pain of uncertain etiology 03/16/2021   Depression    MAJOR DEPRESSION, DR. KAUR;PSYCHIATRIST   HTN (hypertension) 07/06/2019   Hyperlipidemia    Hypertensive heart disease with chronic diastolic congestive heart failure (HCC) 07/06/2019   Hypokalemia 03/16/2021   Snoring 03/16/2021    Assessment: Patient Reported Symptoms:  Cognitive Cognitive Status: Alert and oriented to person, place, and time Cognitive/Intellectual Conditions Management [RPT]: None reported or documented in medical history or problem list   Health Maintenance Behaviors: Annual physical exam  Neurological Neurological Review of Symptoms: No symptoms reported    HEENT HEENT Symptoms Reported: No symptoms reported      Cardiovascular Cardiovascular Symptoms Reported: No symptoms reported Does patient have uncontrolled Hypertension?: No Cardiovascular Management Strategies: Medication therapy, Routine screening Cardiovascular Self-Management Outcome: 4 (good)  Respiratory Respiratory Symptoms Reported: No symptoms reported    Endocrine Endocrine Symptoms Reported: No symptoms reported Is patient diabetic?: No    Gastrointestinal Gastrointestinal Symptoms Reported: No symptoms reported      Genitourinary Genitourinary Symptoms Reported: No symptoms reported    Integumentary Integumentary Symptoms Reported: No symptoms reported    Musculoskeletal Musculoskeletal Symptoms Reviewed: bilateral breast pain Musculoskeletal Management Strategies:  Routine Screening; patient completed a 3 D Mammogram with negative results; plans to schedule a PCP follow up  Musculoskeletal Self-Management Outcome: 3 (uncertain)         Psychosocial Psychosocial Symptoms Reported: Other Other Psychosocial Conditions: stress and anxiety related to having bilateral breast pain and fear of breast CA Behavioral Management Strategies: Support system (Routine Screening) Major Change/Loss/Stressor/Fears (CP): Death of a loved one, Medical condition, family, Medical condition, self Techniques to Cope with Loss/Stress/Change: Medication Quality of Family Relationships: helpful, supportive Do you feel physically threatened by others?: No      06/08/2024   12:39 PM  Depression screen PHQ 2/9  Decreased Interest 3  Down, Depressed, Hopeless 3  PHQ - 2 Score 6  Altered sleeping 3  Tired, decreased energy 3  Change in appetite 3  Feeling bad or failure about yourself  3  Trouble concentrating 3  Moving slowly or fidgety/restless 3  Suicidal thoughts 3  PHQ-9 Score 27  Difficult doing work/chores Somewhat difficult    There were no vitals filed for this visit.  Medications Reviewed Today     Reviewed by Morgan Clayborne CROME, RN (Registered Nurse) on 07/17/24 at 1125  Med List Status: <None>   Medication Order Taking? Sig Documenting Provider Last Dose Status Informant  albuterol  (VENTOLIN  HFA) 108 (90 Base) MCG/ACT inhaler [id number]  Inhale 2 puffs into the lungs every 6 (six) hours as needed for wheezing or shortness of breath. Georgina Speaks, FNP  Active   aspirin EC 81 MG tablet [id number]  Take 81 mg by mouth daily. [provider]  Active   carvedilol  (COREG ) 6.25 MG tablet [id number]  Take 1 tablet (6.25 mg total) by mouth 2 (two) times daily.  Patient not taking: Reported on 06/08/2024   Raford Riggs, MD  Active   clonazePAM (KLONOPIN) 1 MG tablet [id number]  Take  1 mg by mouth 2 (two) times daily. 1/2 tablet by mouth up to twice daily as  needed for anxiety or panic attacks. [provider]  Active   diphenhydrAMINE  HCl (BENADRYL  ALLERGY PO) [id number]  Take 2 tablets by mouth as needed. [provider]  Active   Evolocumab  (REPATHA  SURECLICK) 140 MG/ML SOAJ [id number]  Inject 140 mg into the skin every 14 (fourteen) days. Raford Riggs, MD  Active   FLUoxetine (PROZAC) 20 MG capsule [id number]  Take 40 mg by mouth every morning.  Patient taking differently: Take 20 mg by mouth 2 (two) times daily.   [provider]  Active   hydrALAZINE  (APRESOLINE ) 50 MG tablet [id number]  Take 1 tablet (50 mg total) by mouth 2 (two) times daily. Raford Riggs, MD  Active   hydrochlorothiazide  (HYDRODIURIL ) 12.5 MG tablet [id number]  Take 1 tablet (12.5 mg total) by mouth daily. Georgina Speaks, FNP  Active   lubiprostone  (AMITIZA ) 8 MCG capsule [id number]  Take 1 capsule (8 mcg total) by mouth 2 (two) times daily with a meal. Georgina Speaks, FNP  Active   NEXLETOL  180 MG TABS [id number]  TAKE 1 TABLET(180 MG) BY MOUTH DAILY Raford Riggs, MD  Active   NIFEdipine  (PROCARDIA -XL/NIFEDICAL-XL) 30 MG 24 hr tablet [id number]  Take 1 tablet (30 mg total) by mouth daily. Raford Riggs, MD  Active   Semaglutide -Weight Management (WEGOVY ) 0.5 MG/0.5ML SOAJ [id number]  Inject 0.5 mg into the skin once a week. Raford Riggs, MD  Active   zolpidem (AMBIEN) 10 MG tablet [id number]  Take 10 mg by mouth at bedtime as needed for sleep. [provider]  Active             Recommendation:   Acute PCP follow-up 08/07/24 at 11:40 AM.  Follow Up Plan:   Telephone follow up appointment date/time:  Tuesday, August 26 at 12:15 PM  Clayborne Ly RN BSN CCM [place]  [facility], [facility] Health Nurse Care Coordinator  Direct Dial: [phone] Website: [email or web address]

## 2024-07-24 DIAGNOSIS — M5459 Other low back pain: Secondary | ICD-10-CM | POA: Diagnosis not present

## 2024-07-27 ENCOUNTER — Telehealth (HOSPITAL_BASED_OUTPATIENT_CLINIC_OR_DEPARTMENT_OTHER): Payer: Self-pay | Admitting: *Deleted

## 2024-07-27 NOTE — Telephone Encounter (Signed)
 Spoke with patient and these palpitations and trouble breathing occurred in the hot weather  Now that it is cooler it is much better  Does have palpitations occasionally, would like to wait on getting for now Would like to discuss further at follow up visit   Advised if anything changes between now and follow up to call back and will get monitor mailed to her

## 2024-07-27 NOTE — Telephone Encounter (Signed)
-----   Message from [facility] sent at 07/26/2024  3:40 PM EDT ----- Thank you [name].  [name], can we please have her wear a 14 day monitor prior to her next appointment.    TCR ----- Message ----- From: Morgan Clayborne CROME, RN Sent: 06/08/2024   3:01 PM EDT To: [name], MD  Dear Dr. Scarce,   Patient continues to experience palpitations, and intermittent dizziness. Patient has Essential HTN and CHF. She reports having had a TIA in 2022. She is on a baby ASA and states she made you aware that she stopped Carvedilol  due to not liking the way it made her feel. She is not scheduled to see you again until September so I wanted to make you aware.   Warmly, Clayborne Morgan OBIE BETHANN CCM [place]  [facility], [facility] Nurse Care Coordinator  Direct Dial: [phone] Website: [email or web address]

## 2024-08-03 ENCOUNTER — Ambulatory Visit (HOSPITAL_BASED_OUTPATIENT_CLINIC_OR_DEPARTMENT_OTHER)
Admission: RE | Admit: 2024-08-03 | Discharge: 2024-08-03 | Disposition: A | Discharge status: Home / Self Care | Source: Ambulatory Visit | Attending: Nurse Practitioner | Admitting: Nurse Practitioner

## 2024-08-03 DIAGNOSIS — E2839 Other primary ovarian failure: Secondary | ICD-10-CM | POA: Insufficient documentation

## 2024-08-03 DIAGNOSIS — M810 Age-related osteoporosis without current pathological fracture: Secondary | ICD-10-CM | POA: Diagnosis not present

## 2024-08-03 DIAGNOSIS — Z780 Asymptomatic menopausal state: Secondary | ICD-10-CM | POA: Diagnosis not present

## 2024-08-06 ENCOUNTER — Ambulatory Visit: Payer: Self-pay | Admitting: Nurse Practitioner

## 2024-08-07 ENCOUNTER — Ambulatory Visit: Payer: Self-pay | Admitting: Nurse Practitioner

## 2024-08-08 ENCOUNTER — Ambulatory Visit: Admitting: Family Medicine

## 2024-08-08 ENCOUNTER — Telehealth: Payer: Self-pay | Admitting: Cardiovascular Disease

## 2024-08-08 NOTE — Telephone Encounter (Signed)
 Pt requesting to drop off forms to be renewed for her handicap sticker and she would like a c/b.

## 2024-08-08 NOTE — Telephone Encounter (Signed)
 Pt was calling into the office to let Dr. Tyrell know that she will be dropping off her handicap placard forms to the office tomorrow.  She would like for Dr. Raford to renew and sign these forms when she's back in the office.   Pt is requesting a call back once forms complete.   Pt is aware that Dr. Raford will not be back in the office until Sept 8th.  Pt confirmed she is completely fine with waiting until then.  Pt is aware I will make Dr. Skeeter Nurse aware of this.   Pt verbalized understanding and agrees with this plan.

## 2024-08-08 NOTE — Progress Notes (Signed)
 Ask her if she would like a referral to orthopedics?

## 2024-08-10 ENCOUNTER — Ambulatory Visit: Admitting: Family Medicine

## 2024-08-11 ENCOUNTER — Other Ambulatory Visit: Payer: Self-pay | Admitting: Cardiovascular Disease

## 2024-08-11 DIAGNOSIS — E66811 Obesity, class 1: Secondary | ICD-10-CM

## 2024-08-14 ENCOUNTER — Other Ambulatory Visit: Payer: Self-pay

## 2024-08-14 NOTE — Telephone Encounter (Signed)
 Spoke with patient.  She is doing well on 0.5 mg of Wegovy .  No side effects.  She is eating eggs and chicken for protein.  She is using the bike and bands for exercise.  Increase Wegovy  to 1 mg weekly and follow-up in a month.

## 2024-08-14 NOTE — Patient Instructions (Signed)
 Visit Information  Thank you for taking time to visit with me today. Please don't hesitate to contact me if I can be of assistance to you before our next scheduled appointment.  Your next care management appointment is Telephone follow up appointment date/time:  Friday, September 26 at 10:30 AM  Please call the care guide team at [phone] if you need to cancel, schedule, or reschedule an appointment.   Please call 1-800-273-TALK (toll free, 24 hour hotline) if you are experiencing a Mental Health or Behavioral Health Crisis or need someone to talk to.  Clayborne Ly RN BSN CCM [place]  [facility], [facility] Nurse Care Coordinator  Direct Dial: [phone] Website: [email or web address]

## 2024-08-14 NOTE — Patient Outreach (Addendum)
 Complex Care Management   Visit Note  08/14/2024  Name:  Katie Potts MRN: [id number] DOB: 28-Aug-1951  Situation: Referral received for Complex Care Management related to  Heart Failure and Palpitations, Constipation, Class 1 Obesity, Sciatica pain, Asthma, stage 2 CKD, Essential Hypertension, bilateral breast pain, chronic back pain, bilateral hand pain.  I obtained verbal consent from Patient.  Visit completed with Patient on the phone.  Background:   Past Medical History:  Diagnosis Date   Asthma    Chest pain of uncertain etiology 03/16/2021   Depression    MAJOR DEPRESSION, DR. KAUR;PSYCHIATRIST   HTN (hypertension) 07/06/2019   Hyperlipidemia    Hypertensive heart disease with chronic diastolic congestive heart failure (HCC) 07/06/2019   Hypokalemia 03/16/2021   Snoring 03/16/2021    Assessment: Patient Reported Symptoms:  Cognitive Cognitive Status: Alert and oriented to person, place, and time, Normal speech and language skills Cognitive/Intellectual Conditions Management [RPT]: None reported or documented in medical history or problem list   Health Maintenance Behaviors: Annual physical exam, Healthy diet, Stress management Health Facilitated by: Healthy diet, Rest, Stress management, Pain control  Neurological Neurological Review of Symptoms: No symptoms reported    HEENT HEENT Symptoms Reported: No symptoms reported      Cardiovascular Cardiovascular Symptoms Reported: No symptoms reported    Respiratory Respiratory Symptoms Reported: No symptoms reported    Endocrine Endocrine Symptoms Reported: No symptoms reported Is patient diabetic?: No    Gastrointestinal Gastrointestinal Symptoms Reported: Not assessed      Genitourinary Genitourinary Symptoms Reported: Not assessed    Integumentary Integumentary Symptoms Reported: Not assessed    Musculoskeletal Musculoskelatal Symptoms Reviewed: Back pain, Muscle pain, Joint pain Additional Musculoskeletal Details:  acute onset of pain to  both hands; bilateral breast pain Musculoskeletal Management Strategies: Adequate rest, Medication therapy, Routine screening Musculoskeletal Self-Management Outcome: 3 (uncertain) Falls in the past year?: No Number of falls in past year: 1 or less Was there an injury with Fall?: No Fall Risk Category Calculator: 0 Patient Fall Risk Level: Low Fall Risk Patient at Risk for Falls Due to: No Fall Risks Fall risk Follow up: Falls evaluation completed  Psychosocial Psychosocial Symptoms Reported: No symptoms reported     Quality of Family Relationships: supportive Do you feel physically threatened by others?: No    08/14/2024    PHQ2-9 Depression Screening   Yobani Schertzer interest or pleasure in doing things    Feeling down, depressed, or hopeless    PHQ-2 - Total Score    Trouble falling or staying asleep, or sleeping too much    Feeling tired or having Rodriques Badie energy    Poor appetite or overeating     Feeling bad about yourself - or that you are a failure or have let yourself or your family down    Trouble concentrating on things, such as reading the newspaper or watching television    Moving or speaking so slowly that other people could have noticed.  Or the opposite - being so fidgety or restless that you have been moving around a lot more than usual    Thoughts that you would be better off dead, or hurting yourself in some way    PHQ2-9 Total Score    If you checked off any problems, how difficult have these problems made it for you to do your work, take care of things at home, or get along with other people    Depression Interventions/Treatment      Vitals:   08/14/24 1222  BP: 136/80    Medications Reviewed Today     Reviewed by Morgan Clayborne CROME, RN (Registered Nurse) on 08/14/24 at 1237  Med List Status: <None>   Medication Order Taking? Sig Documenting Provider Last Dose Status Informant  acetaminophen  (TYLENOL ) 500 MG tablet [id number] Yes Take 500 mg by  mouth every 6 (six) hours as needed for moderate pain (pain score 4-6). Patient is taking 1-2 tablets as needed for mild to moderate back pain and hand pain. [provider]  Active   albuterol  (VENTOLIN  HFA) 108 (90 Base) MCG/ACT inhaler [id number]  Inhale 2 puffs into the lungs every 6 (six) hours as needed for wheezing or shortness of breath. Georgina Speaks, FNP  Active   aspirin EC 81 MG tablet [id number]  Take 81 mg by mouth daily. [provider]  Active   carvedilol  (COREG ) 6.25 MG tablet [id number]  Take 1 tablet (6.25 mg total) by mouth 2 (two) times daily.  Patient not taking: Reported on 06/08/2024   Raford Riggs, MD  Active   Cholecalciferol (VITAMIN D3) 125 MCG (5000 UT) CAPS [id number] Yes Take 5,000 Units by mouth daily. [provider]  Active   clonazePAM (KLONOPIN) 1 MG tablet [id number]  Take 1 mg by mouth 2 (two) times daily. 1/2 tablet by mouth up to twice daily as needed for anxiety or panic attacks. [provider]  Active   diphenhydrAMINE  HCl (BENADRYL  ALLERGY PO) [id number]  Take 2 tablets by mouth as needed. [provider]  Active   Evolocumab  (REPATHA  SURECLICK) 140 MG/ML SOAJ [id number]  Inject 140 mg into the skin every 14 (fourteen) days. Raford Riggs, MD  Active   FLUoxetine (PROZAC) 20 MG capsule [id number]  Take 40 mg by mouth every morning.  Patient taking differently: Take 20 mg by mouth 2 (two) times daily.   [provider]  Active   hydrALAZINE  (APRESOLINE ) 50 MG tablet [id number]  Take 1 tablet (50 mg total) by mouth 2 (two) times daily. Raford Riggs, MD  Expired 08/06/24 2359   hydrochlorothiazide  (HYDRODIURIL ) 12.5 MG tablet [id number]  Take 1 tablet (12.5 mg total) by mouth daily. Georgina Speaks, FNP  Active   lubiprostone  (AMITIZA ) 8 MCG capsule [id number]  Take 1 capsule (8 mcg total) by mouth 2 (two) times daily with a meal. Georgina Speaks, FNP  Active   NEXLETOL  180 MG TABS [id number]  TAKE 1  TABLET(180 MG) BY MOUTH DAILY Raford Riggs, MD  Active   NIFEdipine  (PROCARDIA -XL/NIFEDICAL-XL) 30 MG 24 hr tablet [id number]  Take 1 tablet (30 mg total) by mouth daily. Raford Riggs, MD  Active   semaglutide -weight management (WEGOVY ) 1 MG/0.5ML SOAJ SQ injection [id number]  Inject 1 mg into the skin once a week. Raford Riggs, MD  Active   zolpidem (AMBIEN) 10 MG tablet [id number]  Take 10 mg by mouth at bedtime as needed for sleep. [provider]  Active             Recommendation:   PCP Follow-up for AWV on 08/29/24 at 8:20 AM PCP Follow-up with Speaks Georgina on 08/29/24 at 9:00 AM  Follow Up Plan:   Telephone follow up appointment date/time:  Friday, September 26 at 10:30 AM  Clayborne Morgan RN BSN CCM [place]  [facility], [facility] Nurse Care Coordinator  Direct Dial: [phone] Website: [email or web address]

## 2024-08-15 NOTE — Progress Notes (Signed)
See phone note 8/8

## 2024-08-18 ENCOUNTER — Other Ambulatory Visit: Payer: Self-pay | Admitting: Nurse Practitioner

## 2024-08-18 ENCOUNTER — Other Ambulatory Visit (HOSPITAL_BASED_OUTPATIENT_CLINIC_OR_DEPARTMENT_OTHER): Payer: Self-pay | Admitting: Cardiovascular Disease

## 2024-08-18 DIAGNOSIS — I10 Essential (primary) hypertension: Secondary | ICD-10-CM

## 2024-08-28 NOTE — Progress Notes (Signed)
 LILLETTE Kristeen JINNY Gladis, CMA,acting as a Neurosurgeon for Gaines Ada, FNP.,have documented all relevant documentation on the behalf of Gaines Ada, FNP,as directed by  Gaines Ada, FNP while in the presence of Gaines Ada, FNP.  Subjective:  Patient ID: Katie Potts , female    DOB: [birth year] , 73 y.o.   MRN: [id number]  Chief Complaint  Patient presents with   Breast Pain    Patient presents today for bilateral breast pain, patient reports this has been going on for a while. She would like a diagnostic mammogram.      HPI  Discussed the use of AI scribe software for clinical note transcription with the patient, who gave verbal consent to proceed.  History of Present Illness Katie Potts is a 73 year old female who presents with breast pain and follow-up for osteoporosis and arthritis.  She experiences intermittent breast pain in both breasts. A diagnostic mammogram in August returned normal results. She consumes decaffeinated coffee and has no caffeine intake. She is concerned about the pain despite the normal mammogram findings.  She has osteoporosis and is considering treatment options. She is currently taking vitamin C and is aware of the need for vitamin D and calcium  supplementation. She is concerned about the risk of fractures, especially during the winter season.  She has arthritis in her back and experiences weakness in her hands, with difficulty holding objects and frequent dropping of items. She describes numbness, tingling, and pain in her hands, which began approximately four to five months ago. She has not seen an orthopedic or hand specialist for these symptoms. Her sister has rheumatoid arthritis.  She is currently taking Tylenol  500 mg for pain in her back and hands. She is not taking Amitiza  for constipation due to cost and reports it is no longer effective. Her gastroenterologist is Dr. Court, whom she sees for colonoscopy scheduling.  Her sister is hospitalized with low  oxygen levels, and she has been under stress due to family circumstances. She has a therapist and is receiving counseling support.   Past Medical History:  Diagnosis Date   Asthma    Chest pain of uncertain etiology 03/16/2021   Depression    MAJOR DEPRESSION, DR. KAUR;PSYCHIATRIST   HTN (hypertension) 07/06/2019   Hyperlipidemia    Hypertensive heart disease with chronic diastolic congestive heart failure (HCC) 07/06/2019   Hypokalemia 03/16/2021   Snoring 03/16/2021     Family History  Problem Relation Age of Onset   Pancreatic cancer Mother    Cancer Mother    Hyperlipidemia Mother    Hypertension Mother    Varicose Veins Mother    Cancer Father    Deep vein thrombosis Father    Diabetes Father    Heart disease Father    Hyperlipidemia Father    Hypertension Father    Peripheral vascular disease Father        amputation   AAA (abdominal aortic aneurysm) Father    Hypertension Sister    Hyperlipidemia Sister    Mental illness Sister    Hyperlipidemia Sister    Hypertension Sister    Breast cancer Sister        unsure of age   Breast cancer Sister        38s   Colon cancer Paternal Uncle    Breast cancer Cousin        unsure of age   Heart attack Brother    Hypertension Brother      Current Outpatient Medications:  acetaminophen  (TYLENOL ) 500 MG tablet, Take 500 mg by mouth every 6 (six) hours as needed for moderate pain (pain score 4-6). Patient is taking 1-2 tablets as needed for mild to moderate back pain and hand pain., Disp: , Rfl:    albuterol  (VENTOLIN  HFA) 108 (90 Base) MCG/ACT inhaler, Inhale 2 puffs into the lungs every 6 (six) hours as needed for wheezing or shortness of breath., Disp: 18 g, Rfl: 3   aspirin EC 81 MG tablet, Take 81 mg by mouth daily., Disp: , Rfl:    Cholecalciferol (VITAMIN D3) 125 MCG (5000 UT) CAPS, Take 5,000 Units by mouth daily., Disp: , Rfl:    clonazePAM (KLONOPIN) 1 MG tablet, Take 1 mg by mouth 2 (two) times daily. 1/2 tablet  by mouth up to twice daily as needed for anxiety or panic attacks., Disp: , Rfl:    diphenhydrAMINE  HCl (BENADRYL  ALLERGY PO), Take 2 tablets by mouth as needed., Disp: , Rfl:    Evolocumab  (REPATHA  SURECLICK) 140 MG/ML SOAJ, Inject 140 mg into the skin every 14 (fourteen) days., Disp: 6 mL, Rfl: 3   FLUoxetine (PROZAC) 20 MG capsule, Take 40 mg by mouth every morning. (Patient taking differently: Take 20 mg by mouth 2 (two) times daily.), Disp: , Rfl:    hydrALAZINE  (APRESOLINE ) 50 MG tablet, Take 1 tablet (50 mg total) by mouth 2 (two) times daily., Disp: 180 tablet, Rfl: 1   hydrochlorothiazide  (HYDRODIURIL ) 12.5 MG tablet, TAKE 1 TABLET(12.5 MG) BY MOUTH DAILY, Disp: 90 tablet, Rfl: 1   NEXLETOL  180 MG TABS, TAKE 1 TABLET(180 MG) BY MOUTH DAILY, Disp: 30 tablet, Rfl: 11   NIFEdipine  (PROCARDIA -XL/NIFEDICAL-XL) 30 MG 24 hr tablet, Take 1 tablet (30 mg total) by mouth daily., Disp: 30 tablet, Rfl: 3   semaglutide -weight management (WEGOVY ) 1 MG/0.5ML SOAJ SQ injection, Inject 1 mg into the skin once a week., Disp: 2 mL, Rfl: 0   zolpidem (AMBIEN) 10 MG tablet, Take 10 mg by mouth at bedtime as needed for sleep., Disp: , Rfl:    carvedilol  (COREG ) 6.25 MG tablet, Take 1 tablet (6.25 mg total) by mouth 2 (two) times daily. (Patient not taking: Reported on 08/29/2024), Disp: 180 tablet, Rfl: 3   Allergies  Allergen Reactions   Amlodipine  Swelling and Other (See Comments)   Losartan      myalgias   Pravastatin     myalgias   Rosuvastatin      myalgias   Seroquel [Quetiapine Fumarate] Swelling   Spironolactone      Abnormal kidney functions      Review of Systems  Constitutional: Negative.   Respiratory: Negative.    Cardiovascular: Negative.   Gastrointestinal: Negative.   Musculoskeletal:  Positive for myalgias.       Hand pain bilaterally and having difficulty with holding items  Neurological: Negative.   Psychiatric/Behavioral: Negative.       Today's Vitals   08/29/24 0851  BP:  118/70  Pulse: 87  Temp: 97.8 F (36.6 C)  TempSrc: Oral  Weight: 143 lb (64.9 kg)  Height: 5' 1 (1.549 m)  PainSc: 0-No pain   Body mass index is 27.02 kg/m.  Wt Readings from Last 3 Encounters:  08/29/24 143 lb (64.9 kg)  08/29/24 143 lb (64.9 kg)  06/08/24 161 lb (73 kg)    The 10-year ASCVD risk score (Arnett DK, et al., 2019) is: 8.3%   Values used to calculate the score:     Age: 69 years     Clincally relevant sex:  Female     Is Non-Hispanic African American: Yes     Diabetic: No     Tobacco smoker: No     Systolic Blood Pressure: 118 mmHg     Is BP treated: Yes     HDL Cholesterol: 59 mg/dL     Total Cholesterol: 145 mg/dL  Objective:  Physical Exam Vitals and nursing note reviewed.  Constitutional:      General: She is not in acute distress.    Appearance: Normal appearance. She is well-developed. She is obese.  HENT:     Head: Normocephalic and atraumatic.  Eyes:     Pupils: Pupils are equal, round, and reactive to light.  Cardiovascular:     Rate and Rhythm: Normal rate and regular rhythm.     Pulses: Normal pulses.     Heart sounds: Normal heart sounds. No murmur heard. Pulmonary:     Effort: Pulmonary effort is normal. No respiratory distress.     Breath sounds: Normal breath sounds. No wheezing.  Musculoskeletal:        General: Normal range of motion.  Skin:    General: Skin is warm and dry.     Capillary Refill: Capillary refill takes less than 2 seconds.  Neurological:     General: No focal deficit present.     Mental Status: She is alert and oriented to person, place, and time.     Cranial Nerves: No cranial nerve deficit.     Motor: No weakness.  Psychiatric:        Mood and Affect: Mood normal.         Assessment And Plan:  Overweight with body mass index (BMI) of 27 to 27.9 in adult  Breast pain Assessment & Plan: Intermittent breast pain with normal mammogram and dense breast tissue. No malignancy suspected. Discussed rarity of  bilateral breast cancer and imaging results. - Order breast MRI if insurance covers it.  Orders: -     MR BREAST BILATERAL WO CONTRAST; Future  Mixed hyperlipidemia Assessment & Plan: Cholesterol levels are stable, will check lipid panel. Continue repatha  due to intolerance to statins and zetia   Orders: -     Multiple Myeloma Panel (SPEP&IFE w/QIG)  Abnormal glucose  Family history of rheumatic joint disease -     Autoimmune Profile  Family history of breast cancer -     MR BREAST BILATERAL WO CONTRAST; Future  Bilateral hand pain Assessment & Plan: Will check xray and autoimmune panel due to family history of rheumatoid arthritis.  Suspected arthritis in back, possibly osteoarthritis. Differential includes rheumatoid arthritis due to family history. - Order autoimmune panel to evaluate for rheumatoid arthritis. - Consider referral to rheumatologist if labs indicate autoimmune condition.  Hand symptoms for 4-5 months, especially in right hand. Differential includes joint issues or autoimmune conditions. No prior specialist evaluation. - Order x-ray of hands at [facility]. - Order autoimmune panel to evaluate for rheumatoid arthritis. - Consider referral to physical therapy based on x-ray results.  Orders: -     DG Hand Complete Right; Future -     DG Hand Complete Left; Future -     Multiple Myeloma Panel (SPEP&IFE w/QIG)  Chronic midline low back pain without sciatica -     Multiple Myeloma Panel (SPEP&IFE w/QIG)  Chronic diastolic heart failure (HCC)  Hypertensive heart disease with chronic diastolic congestive heart failure (HCC) Assessment & Plan: Blood pressure is controlled, continue current medications.   Orders: -  BMP8+eGFR  Chronic idiopathic constipation Assessment & Plan: Chronic constipation not responsive to Amitiza  and Linzess. Previously managed by gastroenterologist Dr. Rosalie. - Refer to gastroenterologist Dr. Rosalie for constipation  management.  Orders: -     Ambulatory referral to Gastroenterology  Age-related osteoporosis without current pathological fracture Assessment & Plan: This is a new diagnosis. She is considering her options. Discussed a weekly pill vs every 6 month injection. Osteoporosis confirmed. Discussed treatment options and fracture risk. Emphasized non-pharmacological options. - Consider osteoporosis medication options. - Encourage low-impact walking. - Recommend vitamin D and calcium  supplementation.      No follow-ups on file.  Patient was given opportunity to ask questions. Patient verbalized understanding of the plan and was able to repeat key elements of the plan. All questions were answered to their satisfaction.    LILLETTE Gaines Ada, FNP, have reviewed all documentation for this visit. The documentation on 08/29/24 for the exam, diagnosis, procedures, and orders are all accurate and complete.   IF YOU HAVE BEEN REFERRED TO A SPECIALIST, IT MAY TAKE 1-2 WEEKS TO SCHEDULE/PROCESS THE REFERRAL. IF YOU HAVE NOT HEARD FROM US /SPECIALIST IN TWO WEEKS, PLEASE GIVE US  A CALL AT [phone] X [phone].

## 2024-08-29 ENCOUNTER — Ambulatory Visit (INDEPENDENT_AMBULATORY_CARE_PROVIDER_SITE_OTHER): Payer: Self-pay | Admitting: Nurse Practitioner

## 2024-08-29 ENCOUNTER — Encounter: Payer: Self-pay | Admitting: Nurse Practitioner

## 2024-08-29 ENCOUNTER — Ambulatory Visit

## 2024-08-29 VITALS — BP 118/70 | HR 87 | Temp 97.8°F | Ht 61.0 in | Wt 143.0 lb

## 2024-08-29 DIAGNOSIS — K5904 Chronic idiopathic constipation: Secondary | ICD-10-CM | POA: Diagnosis not present

## 2024-08-29 DIAGNOSIS — M810 Age-related osteoporosis without current pathological fracture: Secondary | ICD-10-CM | POA: Diagnosis not present

## 2024-08-29 DIAGNOSIS — Z6827 Body mass index (BMI) 27.0-27.9, adult: Secondary | ICD-10-CM

## 2024-08-29 DIAGNOSIS — M79641 Pain in right hand: Secondary | ICD-10-CM | POA: Diagnosis not present

## 2024-08-29 DIAGNOSIS — Z0000 Encounter for general adult medical examination without abnormal findings: Secondary | ICD-10-CM

## 2024-08-29 DIAGNOSIS — I5032 Chronic diastolic (congestive) heart failure: Secondary | ICD-10-CM | POA: Diagnosis not present

## 2024-08-29 DIAGNOSIS — Z23 Encounter for immunization: Secondary | ICD-10-CM | POA: Diagnosis not present

## 2024-08-29 DIAGNOSIS — Z1159 Encounter for screening for other viral diseases: Secondary | ICD-10-CM | POA: Diagnosis not present

## 2024-08-29 DIAGNOSIS — E782 Mixed hyperlipidemia: Secondary | ICD-10-CM | POA: Diagnosis not present

## 2024-08-29 DIAGNOSIS — R7309 Other abnormal glucose: Secondary | ICD-10-CM | POA: Diagnosis not present

## 2024-08-29 DIAGNOSIS — Z8269 Family history of other diseases of the musculoskeletal system and connective tissue: Secondary | ICD-10-CM

## 2024-08-29 DIAGNOSIS — Z803 Family history of malignant neoplasm of breast: Secondary | ICD-10-CM

## 2024-08-29 DIAGNOSIS — M79642 Pain in left hand: Secondary | ICD-10-CM | POA: Diagnosis not present

## 2024-08-29 DIAGNOSIS — N644 Mastodynia: Secondary | ICD-10-CM

## 2024-08-29 DIAGNOSIS — E663 Overweight: Secondary | ICD-10-CM

## 2024-08-29 DIAGNOSIS — I110 Hypertensive heart disease with heart failure: Secondary | ICD-10-CM

## 2024-08-29 DIAGNOSIS — G8929 Other chronic pain: Secondary | ICD-10-CM

## 2024-08-29 DIAGNOSIS — M545 Low back pain, unspecified: Secondary | ICD-10-CM | POA: Diagnosis not present

## 2024-08-29 NOTE — Patient Instructions (Signed)
 Katie Potts,  Thank you for taking the time for your Medicare Wellness Visit. I appreciate your continued commitment to your health goals. Please review the care plan we discussed, and feel free to reach out if I can assist you further.  Medicare recommends these wellness visits once per year to help you and your care team stay ahead of potential health issues. These visits are designed to focus on prevention, allowing your provider to concentrate on managing your acute and chronic conditions during your regular appointments.  Please note that Annual Wellness Visits do not include a physical exam. Some assessments may be limited, especially if the visit was conducted virtually. If needed, we may recommend a separate in-person follow-up with your provider.  Ongoing Care Seeing your primary care provider every 3 to 6 months helps us  monitor your health and provide consistent, personalized care.   Referrals If a referral was made during today's visit and you haven't received any updates within two weeks, please contact the referred provider directly to check on the status.  Recommended Screenings:  Health Maintenance  Topic Date Due   Hepatitis C Screening  Never done   Zoster (Shingles) Vaccine (1 of 2) 09/07/2024*   COVID-19 Vaccine (1 - 2024-25 season) 09/14/2024*   Pneumococcal Vaccine for age over 70 (2 of 2 - PCV) 06/07/2025*   Medicare Annual Wellness Visit  08/29/2025   Mammogram  07/12/2026   Colon Cancer Screening  11/03/2031   DTaP/Tdap/Td vaccine (2 - Td or Tdap) 05/22/2033   Flu Shot  Completed   DEXA scan (bone density measurement)  Completed   HPV Vaccine  Aged Out   Meningitis B Vaccine  Aged Out  *Topic was postponed. The date shown is not the original due date.       08/29/2024    8:25 AM  Advanced Directives  Does Patient Have a Medical Advance Directive? No  Would patient like information on creating a medical advance directive? Yes (MAU/Ambulatory/Procedural Areas  - Information given)   Advance Care Planning is important because it: Ensures you receive medical care that aligns with your values, goals, and preferences. Provides guidance to your family and loved ones, reducing the emotional burden of decision-making during critical moments.  Vision: Annual vision screenings are recommended for early detection of glaucoma, cataracts, and diabetic retinopathy. These exams can also reveal signs of chronic conditions such as diabetes and high blood pressure.  Dental: Annual dental screenings help detect early signs of oral cancer, gum disease, and other conditions linked to overall health, including heart disease and diabetes.  Please see the attached documents for additional preventive care recommendations.

## 2024-08-29 NOTE — Progress Notes (Signed)
 Subjective:   Katie Potts is a 73 y.o. who presents for a Medicare Wellness preventive visit.  As a reminder, Annual Wellness Visits don't include a physical exam, and some assessments may be limited, especially if this visit is performed virtually. We may recommend an in-person follow-up visit with your provider if needed.  Visit Complete: In person    Persons Participating in Visit: Patient.  AWV Questionnaire: No: Patient Medicare AWV questionnaire was not completed prior to this visit.  Cardiac Risk Factors include: advanced age (>91men, >18 women);dyslipidemia;hypertension     Objective:    Today's Vitals   08/29/24 0815  BP: 118/70  Pulse: 87  Temp: 97.8 F (36.6 C)  TempSrc: Oral  SpO2: 97%  Weight: 143 lb (64.9 kg)  Height: 5' 1 (1.549 m)   Body mass index is 27.02 kg/m.     08/29/2024    8:25 AM 08/24/2023    8:54 AM 11/06/2019   10:53 AM 04/18/2015    4:41 PM  Advanced Directives  Does Patient Have a Medical Advance Directive? No Yes No No   Does patient want to make changes to medical advance directive?  Yes (MAU/Ambulatory/Procedural Areas - Information given)    Would patient like information on creating a medical advance directive? Yes (MAU/Ambulatory/Procedural Areas - Information given)  No - Patient declined      Data saved with a previous flowsheet row definition    Current Medications (verified) Outpatient Encounter Medications as of 08/29/2024  Medication Sig   acetaminophen  (TYLENOL ) 500 MG tablet Take 500 mg by mouth every 6 (six) hours as needed for moderate pain (pain score 4-6). Patient is taking 1-2 tablets as needed for mild to moderate back pain and hand pain.   albuterol  (VENTOLIN  HFA) 108 (90 Base) MCG/ACT inhaler Inhale 2 puffs into the lungs every 6 (six) hours as needed for wheezing or shortness of breath.   aspirin EC 81 MG tablet Take 81 mg by mouth daily.   Cholecalciferol (VITAMIN D3) 125 MCG (5000 UT) CAPS Take 5,000 Units  by mouth daily.   clonazePAM (KLONOPIN) 1 MG tablet Take 1 mg by mouth 2 (two) times daily. 1/2 tablet by mouth up to twice daily as needed for anxiety or panic attacks.   diphenhydrAMINE  HCl (BENADRYL  ALLERGY PO) Take 2 tablets by mouth as needed.   Evolocumab  (REPATHA  SURECLICK) 140 MG/ML SOAJ Inject 140 mg into the skin every 14 (fourteen) days.   FLUoxetine (PROZAC) 20 MG capsule Take 40 mg by mouth every morning. (Patient taking differently: Take 20 mg by mouth 2 (two) times daily.)   hydrALAZINE  (APRESOLINE ) 50 MG tablet Take 1 tablet (50 mg total) by mouth 2 (two) times daily.   hydrochlorothiazide  (HYDRODIURIL ) 12.5 MG tablet TAKE 1 TABLET(12.5 MG) BY MOUTH DAILY   NEXLETOL  180 MG TABS TAKE 1 TABLET(180 MG) BY MOUTH DAILY   NIFEdipine  (PROCARDIA -XL/NIFEDICAL-XL) 30 MG 24 hr tablet Take 1 tablet (30 mg total) by mouth daily.   semaglutide -weight management (WEGOVY ) 1 MG/0.5ML SOAJ SQ injection Inject 1 mg into the skin once a week.   zolpidem (AMBIEN) 10 MG tablet Take 10 mg by mouth at bedtime as needed for sleep.   carvedilol  (COREG ) 6.25 MG tablet Take 1 tablet (6.25 mg total) by mouth 2 (two) times daily. (Patient not taking: Reported on 08/29/2024)   lubiprostone  (AMITIZA ) 8 MCG capsule Take 1 capsule (8 mcg total) by mouth 2 (two) times daily with a meal. (Patient not taking: Reported on 08/29/2024)  No facility-administered encounter medications on file as of 08/29/2024.    Allergies (verified) Amlodipine , Losartan , Pravastatin, Rosuvastatin , Seroquel [quetiapine fumarate], and Spironolactone    History: Past Medical History:  Diagnosis Date   Asthma    Chest pain of uncertain etiology 03/16/2021   Depression    MAJOR DEPRESSION, DR. KAUR;PSYCHIATRIST   HTN (hypertension) 07/06/2019   Hyperlipidemia    Hypertensive heart disease with chronic diastolic congestive heart failure (HCC) 07/06/2019   Hypokalemia 03/16/2021   Snoring 03/16/2021   Past Surgical History:  Procedure  Laterality Date   BREAST BIOPSY     BREAST EXCISIONAL BIOPSY     BREAST SURGERY     REDUCTION MAMMAPLASTY Bilateral 1973   Family History  Problem Relation Age of Onset   Pancreatic cancer Mother    Cancer Mother    Hyperlipidemia Mother    Hypertension Mother    Varicose Veins Mother    Cancer Father    Deep vein thrombosis Father    Diabetes Father    Heart disease Father    Hyperlipidemia Father    Hypertension Father    Peripheral vascular disease Father        amputation   AAA (abdominal aortic aneurysm) Father    Hypertension Sister    Hyperlipidemia Sister    Mental illness Sister    Hyperlipidemia Sister    Hypertension Sister    Breast cancer Sister        unsure of age   Breast cancer Sister        40s   Colon cancer Paternal Uncle    Breast cancer Cousin        unsure of age   Heart attack Brother    Hypertension Brother    Social History   Socioeconomic History   Marital status: Single    Spouse name: Not on file   Number of children: 1   Years of education: Not on file   Highest education level: Some college, no degree  Occupational History   Not on file  Tobacco Use   Smoking status: Never   Smokeless tobacco: Never  Vaping Use   Vaping status: Never Used  Substance and Sexual Activity   Alcohol use: No    Alcohol/week: 0.0 standard drinks of alcohol   Drug use: No   Sexual activity: Not Currently  Other Topics Concern   Not on file  Social History Narrative   Not on file   Social Drivers of Health   Financial Resource Strain: Medium Risk (08/29/2024)   Overall Financial Resource Strain (CARDIA)    Difficulty of Paying Living Expenses: Somewhat hard  Food Insecurity: Food Insecurity Present (08/29/2024)   Hunger Vital Sign    Worried About Running Out of Food in the Last Year: Often true    Ran Out of Food in the Last Year: Often true  Transportation Needs: No Transportation Needs (08/29/2024)   PRAPARE - Scientist, research (physical sciences) (Medical): No    Lack of Transportation (Non-Medical): No  Physical Activity: Insufficiently Active (08/29/2024)   Exercise Vital Sign    Days of Exercise per Week: 3 days    Minutes of Exercise per Session: 20 min  Stress: Stress Concern Present (08/29/2024)   [employer] of Occupational Health - Occupational Stress Questionnaire    Feeling of Stress: Rather much  Social Connections: Unknown (08/29/2024)   Social Connection and Isolation Panel    Frequency of Communication with Friends and Family: Twice a  week    Frequency of Social Gatherings with Friends and Family: Not on file    Attends Religious Services: Never    Database administrator or Organizations: No    Attends Banker Meetings: Never    Marital Status: Divorced  Recent Concern: Social Connections - Socially Isolated (06/08/2024)   Social Connection and Isolation Panel    Frequency of Communication with Friends and Family: Twice a week    Frequency of Social Gatherings with Friends and Family: Once a week    Attends Religious Services: Never    Database administrator or Organizations: No    Attends Engineer, structural: Never    Marital Status: Divorced    Tobacco Counseling Counseling given: Not Answered    Clinical Intake:  Pre-visit preparation completed: Yes  Pain : No/denies pain     Nutritional Status: BMI 25 -29 Overweight Nutritional Risks: None Diabetes: No  Lab Results  Component Value Date   HGBA1C 5.4 11/30/2023     How often do you need to have someone help you when you read instructions, pamphlets, or other written materials from your doctor or pharmacy?: 1 - Never  Interpreter Needed?: No  Information entered by :: NAllen LPN   Activities of Daily Living     08/29/2024    8:17 AM  In your present state of health, do you have any difficulty performing the following activities:  Hearing? 0  Vision? 0  Difficulty concentrating or making  decisions? 0  Walking or climbing stairs? 1  Dressing or bathing? 0  Doing errands, shopping? 0  Preparing Food and eating ? N  Using the Toilet? N  In the past six months, have you accidently leaked urine? N  Do you have problems with loss of bowel control? N  Managing your Medications? N  Managing your Finances? N  Housekeeping or managing your Housekeeping? N    Patient Care Team: Georgina Speaks, FNP as PCP - General (General Practice) Claudene Victory ORN, MD (Inactive) as PCP - Cardiology (Cardiology) Ezzard Rolin BIRCH, LCSW as VBCI Care Management (Licensed Clinical Social Worker) Morgan, Clayborne CROME, RN as VBCI Care Management  I have updated your Care Teams any recent Medical Services you may have received from other providers in the past year.     Assessment:   This is a routine wellness examination for Neeley.  Hearing/Vision screen Hearing Screening - Comments:: Denies hearing issues Vision Screening - Comments:: Regular eye exams, [facility]   Goals Addressed             This Visit's Progress    Patient Stated       08/29/2024, wants to stay healthy       Depression Screen     08/29/2024    8:26 AM 06/08/2024   12:39 PM 08/24/2023    8:57 AM 05/23/2023    8:37 AM 05/10/2023    8:56 AM 03/16/2021   10:10 AM  PHQ 2/9 Scores  PHQ - 2 Score 6 6 6  0 3 4  PHQ- 9 Score 18 27 21 2 5 10     Fall Risk     08/29/2024    8:26 AM 08/14/2024   12:22 PM 06/08/2024   12:31 PM 08/24/2023    8:56 AM 05/23/2023    8:37 AM  Fall Risk   Falls in the past year? 0 0 0 0 0  Number falls in past yr: 0 0 0 0 0  Injury with Fall? 0 0 0 0 0  Risk for fall due to : Medication side effect No Fall Risks Impaired balance/gait;Impaired mobility Medication side effect   Follow up Falls evaluation completed;Falls prevention discussed Falls evaluation completed Falls evaluation completed;Education provided Falls prevention discussed;Falls evaluation completed Falls evaluation completed     MEDICARE RISK AT HOME:  Medicare Risk at Home Any stairs in or around the home?: No If so, are there any without handrails?: No Home free of loose throw rugs in walkways, pet beds, electrical cords, etc?: Yes Adequate lighting in your home to reduce risk of falls?: Yes Life alert?: No Use of a cane, walker or w/c?: No Grab bars in the bathroom?: No Shower chair or bench in shower?: No Elevated toilet seat or a handicapped toilet?: No  TIMED UP AND GO:  Was the test performed?  Yes  Length of time to ambulate 10 feet: 5 sec Gait steady and fast without use of assistive device  Cognitive Function: 6CIT completed        08/29/2024    8:29 AM 08/24/2023    8:58 AM  6CIT Screen  What Year? 0 points 0 points  What month? 0 points 0 points  What time? 0 points 0 points  Count back from 20 0 points 0 points  Months in reverse 2 points 0 points  Repeat phrase 6 points 10 points  Total Score 8 points 10 points    Immunizations Immunization History  Administered Date(s) Administered   Fluad Trivalent(High Dose 65+) 08/24/2023   INFLUENZA, HIGH DOSE SEASONAL PF 08/29/2024   Tdap 05/23/2023    Screening Tests Health Maintenance  Topic Date Due   Hepatitis C Screening  Never done   Zoster Vaccines- Shingrix (1 of 2) 09/07/2024 (Originally 05/22/2003)   COVID-19 Vaccine (1 - 2024-25 season) 09/14/2024 (Originally 08/20/2024)   Pneumococcal Vaccine: 50+ Years (2 of 2 - PCV) 06/07/2025 (Originally 10/20/2022)   Medicare Annual Wellness (AWV)  08/29/2025   MAMMOGRAM  07/12/2026   Colonoscopy  11/03/2031   DTaP/Tdap/Td (2 - Td or Tdap) 05/22/2033   Influenza Vaccine  Completed   DEXA SCAN  Completed   HPV VACCINES  Aged Out   Meningococcal B Vaccine  Aged Out    Health Maintenance Items Addressed: Influenza vaccine given  Additional Screening:  Vision Screening: Recommended annual ophthalmology exams for early detection of glaucoma and other disorders of the eye. Is the  patient up to date with their annual eye exam?  No  Who is the provider or what is the name of the office in which the patient attends annual eye exams? [facility]  Dental Screening: Recommended annual dental exams for proper oral hygiene  Community Resource Referral / Chronic Care Management: CRR required this visit?  No   CCM required this visit?  No   Plan:    I have personally reviewed and noted the following in the patient's chart:   Medical and social history Use of alcohol, tobacco or illicit drugs  Current medications and supplements including opioid prescriptions. Patient is not currently taking opioid prescriptions. Functional ability and status Nutritional status Physical activity Advanced directives List of other physicians Hospitalizations, surgeries, and ER visits in previous 12 months Vitals Screenings to include cognitive, depression, and falls Referrals and appointments  In addition, I have reviewed and discussed with patient certain preventive protocols, quality metrics, and best practice recommendations. A written personalized care plan for preventive services as well as general preventive health  recommendations were provided to patient.   Ardella FORBES Dawn, LPN   [id number]   After Visit Summary: (In Person-Printed) AVS printed and given to the patient  Notes: Nothing significant to report at this time.

## 2024-08-30 ENCOUNTER — Ambulatory Visit: Admitting: Nurse Practitioner

## 2024-08-30 LAB — HEPATITIS B CORE ANTIBODY, TOTAL: Hep B Core Total Ab: NEGATIVE

## 2024-09-09 ENCOUNTER — Encounter: Payer: Self-pay | Admitting: Nurse Practitioner

## 2024-09-09 DIAGNOSIS — N644 Mastodynia: Secondary | ICD-10-CM | POA: Insufficient documentation

## 2024-09-09 DIAGNOSIS — M810 Age-related osteoporosis without current pathological fracture: Secondary | ICD-10-CM | POA: Insufficient documentation

## 2024-09-09 DIAGNOSIS — M545 Low back pain, unspecified: Secondary | ICD-10-CM | POA: Insufficient documentation

## 2024-09-09 DIAGNOSIS — M79641 Pain in right hand: Secondary | ICD-10-CM | POA: Insufficient documentation

## 2024-09-09 NOTE — Assessment & Plan Note (Signed)
 Chronic constipation not responsive to Amitiza  and Linzess. Previously managed by gastroenterologist Dr. Rosalie. - Refer to gastroenterologist Dr. Rosalie for constipation management.

## 2024-09-09 NOTE — Assessment & Plan Note (Signed)
 Intermittent breast pain with normal mammogram and dense breast tissue. No malignancy suspected. Discussed rarity of bilateral breast cancer and imaging results. - Order breast MRI if insurance covers it.

## 2024-09-09 NOTE — Assessment & Plan Note (Addendum)
 This is a new diagnosis. She is considering her options. Discussed a weekly pill vs every 6 month injection. Osteoporosis confirmed. Discussed treatment options and fracture risk. Emphasized non-pharmacological options. - Consider osteoporosis medication options. - Encourage low-impact walking. - Recommend vitamin D and calcium  supplementation.

## 2024-09-09 NOTE — Assessment & Plan Note (Addendum)
 Will check xray and autoimmune panel due to family history of rheumatoid arthritis.  Suspected arthritis in back, possibly osteoarthritis. Differential includes rheumatoid arthritis due to family history. - Order autoimmune panel to evaluate for rheumatoid arthritis. - Consider referral to rheumatologist if labs indicate autoimmune condition.  Hand symptoms for 4-5 months, especially in right hand. Differential includes joint issues or autoimmune conditions. No prior specialist evaluation. - Order x-ray of hands at [facility]. - Order autoimmune panel to evaluate for rheumatoid arthritis. - Consider referral to physical therapy based on x-ray results.

## 2024-09-09 NOTE — Assessment & Plan Note (Signed)
 Cholesterol levels are stable, will check lipid panel. Continue repatha due to intolerance to statins and zetia

## 2024-09-09 NOTE — Assessment & Plan Note (Signed)
 Blood pressure is controlled, continue current medications

## 2024-09-10 ENCOUNTER — Telehealth: Payer: Self-pay

## 2024-09-10 NOTE — Addendum Note (Signed)
 Addended by: GEORGINA SPEAKS F on: 09/10/2024 02:29 PM   Modules accepted: Orders

## 2024-09-10 NOTE — Telephone Encounter (Signed)
 Copied from CRM #[id number]. Topic: Clinical - Request for Lab/Test Order >> Sep 10, 2024 10:40 AM Leonette SQUIBB wrote: Reason for CRM: Shamirah with [facility] needs clarification on the MRI breast Bilat without contrast.  She said it has to say with and with out contrast.  She said it could be changed in the system.  CB#  [phone]

## 2024-09-10 NOTE — Telephone Encounter (Signed)
 I have updated the order

## 2024-09-12 ENCOUNTER — Encounter (HOSPITAL_BASED_OUTPATIENT_CLINIC_OR_DEPARTMENT_OTHER): Payer: Self-pay | Admitting: Cardiovascular Disease

## 2024-09-12 ENCOUNTER — Telehealth: Payer: Self-pay | Admitting: Cardiovascular Disease

## 2024-09-12 ENCOUNTER — Ambulatory Visit (HOSPITAL_BASED_OUTPATIENT_CLINIC_OR_DEPARTMENT_OTHER): Admitting: Cardiovascular Disease

## 2024-09-12 VITALS — BP 154/84 | HR 79 | Resp 17 | Ht 61.0 in | Wt 136.0 lb

## 2024-09-12 DIAGNOSIS — I110 Hypertensive heart disease with heart failure: Secondary | ICD-10-CM | POA: Diagnosis not present

## 2024-09-12 DIAGNOSIS — R0609 Other forms of dyspnea: Secondary | ICD-10-CM | POA: Diagnosis not present

## 2024-09-12 DIAGNOSIS — R0602 Shortness of breath: Secondary | ICD-10-CM | POA: Diagnosis not present

## 2024-09-12 DIAGNOSIS — E782 Mixed hyperlipidemia: Secondary | ICD-10-CM

## 2024-09-12 DIAGNOSIS — I5032 Chronic diastolic (congestive) heart failure: Secondary | ICD-10-CM

## 2024-09-12 DIAGNOSIS — I70209 Unspecified atherosclerosis of native arteries of extremities, unspecified extremity: Secondary | ICD-10-CM

## 2024-09-12 DIAGNOSIS — Z5181 Encounter for therapeutic drug level monitoring: Secondary | ICD-10-CM | POA: Diagnosis not present

## 2024-09-12 MED ORDER — METOPROLOL TARTRATE 100 MG PO TABS: 100.0000 mg | ORAL_TABLET | Freq: Once | ORAL | 0 refills | Status: DC

## 2024-09-12 NOTE — Progress Notes (Signed)
 Cardiology Office Note:  .   Date:  09/12/2024  ID:  Katie Potts, DOB 02-20-1951, MRN [id number] PCP: Georgina Speaks, FNP  [facility] Providers Cardiologist:  Victory LELON Claudene DOUGLAS, MD (Inactive)    History of Present Illness: .    Katie Potts is a 73 y.o. female with non-obstructive CAD, chronic diastolic heart failure, TIA, hypertension, hyperlipidemia, who presents for follow-up. She is a former patient of Dr. Claudene, last seen by him 08/2022. At that visit her prior palpitations and chest pain had resolved. Blood pressures with borderline control on  carvedilol  and amlodipine . She was on Zetia  10 mg daily for hyperlipidemia, known statin intolerance. LDL was elevated above 200; she saw our pharmacist 10/2022 and was started on Repatha . Her LDL was 103 in 01/2023 and she was started on 5 mg rosuvastatin  daily. However, she was unable to tolerate either rosuvastatin  or zetia . Repeat lipids 04/2023 with LDL 117; Nexletol  was initiated.   Echo 2018 with LVEF 60-65% and grade 1 diastolic dysfunction. She had a coronary CT 03/2021 revealing a coronary calcium  score of 73. There was calcified plaque in the ostial left main and ramus with minimal stenosis; noncalcified plaque in the proximal LAD with minimal stenosis.   At her visit 08/2022 she was struggling with significant family stress. She complained of dizziness and nausea.  BP was elevated in the office and at home.  Valsartan  was added and she was enrolled in our remote patient monitoring program.  She was also referred to the PREP program.  She met withour pharmacist and was started on Weygovy.  She reported palpitations and an ambulatory monitor was ordered but not worn.    Discussed the use of AI scribe software for clinical note transcription with the patient, who gave verbal consent to proceed.  History of Present Illness Ms. Katie Potts experiences episodes of shortness of breath that occur unpredictably, with particularly severe episodes occurring recently. The dyspnea is exacerbated by exertion, such as walking to the bathroom or in a store, and is sometimes accompanied by dizziness and a sensation of being chilled. These episodes are distressing, especially when she is alone. The shortness of breath improves with rest.  Her blood pressure readings at home have been around 130/70 mmHg, with the lowest recorded at 116/unknown and the highest in the 130s. Her current medications for blood pressure management include hydrochlorothiazide , hydralazine , nifedipine , and metoprolol . She also takes Wegovy ,  Nexletol , and Repatha  injections.  She has a family history of cardiac issues, as her sister has a failing heart and has experienced multiple hospitalizations due to a major stroke and brain bleed. This situation is causing her significant stress, as her sister is not receiving adequate care from family members.  She reports new symptoms of wheezing and deep breaths, which began recently. She has not been able to exercise due to the shortness of breath, although she was previously using a stationary bike regularly until the symptoms worsened.  No chest pain, but she reports chest pressure. She confirms shortness of breath with exertion and when lying down.  ROS:  As per HPI  Studies Reviewed: [facility]       Coronary CT-A 03/2021: IMPRESSIONS     1. Left ventricular ejection fraction, by estimation, is 60 to 65%. The  left ventricle has normal function. The left ventricle has no regional  wall motion abnormalities. Left ventricular diastolic parameters were  normal. The average left ventricular  global longitudinal strain is -22.3 %. The global longitudinal  strain is  normal.   2. Right ventricular systolic function is normal. The right ventricular  size is normal.   3. The mitral valve is normal in structure. No evidence of mitral valve  regurgitation. No evidence of mitral stenosis.   4. The aortic valve is tricuspid. There is moderate calcification of the  aortic valve. There is moderate thickening of the aortic valve. Aortic  valve regurgitation is not visualized. Moderate aortic valve stenosis.   5. The inferior vena cava is normal in size with greater than 50%  respiratory variability, suggesting right atrial pressure of 3 mmHg.    Risk Assessment/Calculations:         Physical Exam:   VS:  There were no vitals taken for this visit. , BMI There is no height or weight on file to calculate BMI. GENERAL:  Well appearing HEENT: Pupils equal round and reactive, fundi not visualized,  oral mucosa unremarkable NECK:  No jugular venous distention, waveform within normal limits, carotid upstroke brisk and symmetric, no bruits, no thyromegaly LUNGS:  Clear to auscultation bilaterally HEART:  RRR.  PMI not displaced or sustained,S1 and S2 within normal limits, no S3, no S4, no clicks, no rubs, no murmurs ABD:  Flat, positive bowel sounds normal in frequency in pitch, no bruits, no rebound, no guarding, no midline pulsatile mass, no hepatomegaly, no splenomegaly EXT:  2 plus pulses throughout, no edema, no cyanosis no clubbing SKIN:  No rashes no nodules NEURO:  Cranial nerves II through XII grossly intact, motor grossly intact throughout PSYCH:  Cognitively intact, oriented to person place and time   ASSESSMENT AND PLAN: .    Assessment & Plan # Exertional dyspnea and orthopnea Intermittent dyspnea and orthopnea with chest pressure and dizziness, improving with rest. Differential includes cardiac and pulmonary causes. Absence of peripheral edema and elevated neck veins reduces likelihood of heart failure. Stress may contribute. - Order echocardiogram to assess cardiac function. - Order cardiac CT to evaluate for coronary artery disease. - Order BNP to assess for heart failure. - Consider pulmonary evaluation if cardiac workup is negative.  # Hypertensive heart disease Blood pressure readings in the 130s at home. No signs of acute heart failure. Family history of heart failure noted. - Continue current antihypertensive regimen.  Nifedipine , hydrochlorothiazide   - Monitor blood pressure at home and record readings. - Re-evaluate blood pressure management in 1-2 months.  Mixed hyperlipidemia Managed with Nexletol  and Repatha . No recent lipid panel available. - Order lipid panel to assess current lipid levels. - Continue Repatha  and Nexletol .           Dispo: f/u with APP/PHarmD in 1-2 months.  F/u with me in 6 months  Signed, [name], MD

## 2024-09-12 NOTE — Telephone Encounter (Signed)
 Pt wanted to make Dr. Raford aware she is taking Nifedipine  30mg  once daily. Could not remember the name of the medication at her appt today, but remembered once she was home.

## 2024-09-12 NOTE — Telephone Encounter (Signed)
 Made Dr Raford aware, no changes at this time

## 2024-09-12 NOTE — Patient Instructions (Addendum)
 Medication Instructions:  TAKE METOPROLOL  100 MG 2 HOURS PRIOR TO CARDIAC C T  *If you need a refill on your cardiac medications before your next appointment, please call your pharmacy*  Lab Work: LP/CMET/BNP TODAY   If you have labs (blood work) drawn today and your tests are completely normal, you will receive your results only by: MyChart Message (if you have MyChart) OR A paper copy in the mail If you have any lab test that is abnormal or we need to change your treatment, we will call you to review the results.  Testing/Procedures: Your physician has requested that you have an echocardiogram. Echocardiography is a painless test that uses sound waves to create images of your heart. It provides your doctor with information about the size and shape of your heart and how well your heart's chambers and valves are working. This procedure takes approximately one hour. There are no restrictions for this procedure. Please do NOT wear cologne, perfume, aftershave, or lotions (deodorant is allowed). Please arrive 15 minutes prior to your appointment time.  Please note: We ask at that you not bring children with you during ultrasound (echo/ vascular) testing. Due to room size and safety concerns, children are not allowed in the ultrasound rooms during exams. Our front office staff cannot provide observation of children in our lobby area while testing is being conducted. An adult accompanying a patient to their appointment will only be allowed in the ultrasound room at the discretion of the ultrasound technician under special circumstances. We apologize for any inconvenience.   Your physician has requested that you have cardiac CT. Cardiac computed tomography (CT) is a painless test that uses an x-ray machine to take clear, detailed pictures of your heart. For further information please visit [email or web address] Please follow instruction sheet as given.  Follow-Up: At [facility], you and  your health needs are our priority.  As part of our continuing mission to provide you with exceptional heart care, our providers are all part of one team.  This team includes your primary Cardiologist (physician) and Advanced Practice Providers or APPs (Physician Assistants and Nurse Practitioners) who all work together to provide you with the care you need, when you need it.  Your next appointment:   1 TO 2 month(s)  Provider:   Reche Finder, NP or Kristin Alvstad, PharmD   6 MONTHS WITH DR [facility]   We recommend signing up for the patient portal called MyChart.  Sign up information is provided on this After Visit Summary.  MyChart is used to connect with patients for Virtual Visits (Telemedicine).  Patients are able to view lab/test results, encounter notes, upcoming appointments, etc.  Non-urgent messages can be sent to your provider as well.   To learn more about what you can do with MyChart, go to [email or web address].   Other Instructions MONITOR YOUR BLOOD PRESSURE 1 TO 2 TIMES A DAY, LOG AND BRING MACHINE TO FOLLOW UP   Your cardiac CT will be scheduled at one of the below locations:   [facility] [address] [place], [place] [address] [phone] (Severe contrast allergies only)  OR   [facility] [address] [place], [place] [address] [phone]  OR   [facility] [address] [place], [place] [address] [phone]  OR   [facility] and Vascular Tower [address]  [place], [place] [address] [phone]  OR   [facility]  [address] [place],  [place] [phone]  If scheduled at [facility], please arrive at the [facility] and Children's Entrance (Entrance C2) of [facility] 30 minutes prior to test start time. You can use the FREE valet parking offered at entrance C (encouraged to control the heart rate for the test)  Proceed to the [facility]  Radiology Department (first floor) to check-in and test prep.  All radiology patients and guests should use entrance C2 at [facility], accessed from [facility], even though the hospital's physical address listed is [address].  If scheduled at the Heart and Vascular Tower at [address], please enter the parking lot using the [address] entrance and use the FREE valet service at the patient drop-off area. Enter the building and check-in with registration on the main floor.  If scheduled at [facility], please arrive to the Heart and Vascular Center 15 mins early for check-in and test prep.  There is spacious parking and easy access to the radiology department from the [facility] and V[facility] entrance. Please enter here and check-in with the desk attendant.   If scheduled at [facility], please arrive 30 minutes early for check-in and test prep.  Please follow these instructions carefully (unless otherwise directed):  An IV will be required for this test and Nitroglycerin  will be given.  Hold all erectile dysfunction medications at least 3 days (72 hrs) prior to test. (Ie viagra, cialis, sildenafil, tadalafil, etc)   On the Night Before the Test: Be sure to Drink plenty of water. Do not consume any caffeinated/decaffeinated beverages or chocolate 12 hours prior to your test. Do not take any antihistamines 12 hours prior to your test.  On the Day of the Test: Drink plenty of water until 1 hour prior to the test. Do not eat any food 1 hour prior to test. You may take your regular medications prior to the test.  Take metoprolol  (Lopressor ) two hours prior to test. If you take Furosemide/Hydrochlorothiazide /Spironolactone /Chlorthalidone, please HOLD on the morning of the test. Patients who wear a continuous glucose monitor MUST remove the device prior to scanning. FEMALES- please wear underwire-free bra if available,  avoid dresses & tight clothing  After the Test: Drink plenty of water. After receiving IV contrast, you may experience a mild flushed feeling. This is normal. On occasion, you may experience a mild rash up to 24 hours after the test. This is not dangerous. If this occurs, you can take Benadryl  25 mg, Zyrtec, Claritin, or Allegra and increase your fluid intake. (Patients taking Tikosyn should avoid Benadryl , and may take Zyrtec, Claritin, or Allegra) If you experience trouble breathing, this can be serious. If it is severe call 911 IMMEDIATELY. If it is mild, please call our office.  We will call to schedule your test 2-4 weeks out understanding that some insurance companies will need an authorization prior to the service being performed.   For more information and frequently asked questions, please visit our website : [email or web address]  For non-scheduling related questions, please contact the cardiac imaging nurse navigator should you have any questions/concerns: Cardiac Imaging Nurse Navigators Direct Office Dial: [phone]   For scheduling needs, including cancellations and rescheduling, please call [place], [phone].

## 2024-09-13 ENCOUNTER — Other Ambulatory Visit

## 2024-09-13 DIAGNOSIS — M79642 Pain in left hand: Secondary | ICD-10-CM | POA: Diagnosis not present

## 2024-09-13 DIAGNOSIS — I5032 Chronic diastolic (congestive) heart failure: Secondary | ICD-10-CM | POA: Diagnosis not present

## 2024-09-13 DIAGNOSIS — M79641 Pain in right hand: Secondary | ICD-10-CM | POA: Diagnosis not present

## 2024-09-13 DIAGNOSIS — E782 Mixed hyperlipidemia: Secondary | ICD-10-CM | POA: Diagnosis not present

## 2024-09-13 LAB — COMPREHENSIVE METABOLIC PANEL WITH GFR
ALT: 15 IU/L (ref 0–32)
AST: 22 IU/L (ref 0–40)
Albumin: 4.6 g/dL (ref 3.8–4.8)
Alkaline Phosphatase: 59 IU/L (ref 49–135)
BUN/Creatinine Ratio: 14 (ref 12–28)
BUN: 19 mg/dL (ref 8–27)
Bilirubin Total: 0.7 mg/dL (ref 0.0–1.2)
CO2: 27 mmol/L (ref 20–29)
Calcium: 10.6 mg/dL — ABNORMAL HIGH (ref 8.7–10.3)
Chloride: 97 mmol/L (ref 96–106)
Creatinine, Ser: 1.39 mg/dL — ABNORMAL HIGH (ref 0.57–1.00)
Globulin, Total: 2.7 g/dL (ref 1.5–4.5)
Glucose: 82 mg/dL (ref 70–99)
Potassium: 3.1 mmol/L — ABNORMAL LOW (ref 3.5–5.2)
Sodium: 141 mmol/L (ref 134–144)
Total Protein: 7.3 g/dL (ref 6.0–8.5)
eGFR: 41 mL/min/1.73 — ABNORMAL LOW (ref >59)

## 2024-09-13 LAB — LIPID PANEL
Chol/HDL Ratio: 2.5 ratio (ref 0.0–4.4)
Cholesterol, Total: 157 mg/dL (ref 100–199)
HDL: 62 mg/dL (ref >39)
LDL Chol Calc (NIH): 79 mg/dL (ref 0–99)
Triglycerides: 85 mg/dL (ref 0–149)
VLDL Cholesterol Cal: 16 mg/dL (ref 5–40)

## 2024-09-13 LAB — BRAIN NATRIURETIC PEPTIDE: BNP: 8.4 pg/mL (ref 0.0–100.0)

## 2024-09-13 NOTE — Telephone Encounter (Signed)
 Patient was seen by Dr Raford 9/24

## 2024-09-14 ENCOUNTER — Other Ambulatory Visit: Payer: Self-pay

## 2024-09-14 NOTE — Patient Instructions (Signed)
 Visit Information  Thank you for taking time to visit with me today. Please don't hesitate to contact me if I can be of assistance to you before our next scheduled appointment.  Your next care management appointment is by telephone on Tuesday, October 28 at 10:30 AM  Please call the care guide team at [phone] if you need to cancel, schedule, or reschedule an appointment.   Please call 1-800-273-TALK (toll free, 24 hour hotline) if you are experiencing a Mental Health or Behavioral Health Crisis or need someone to talk to.  Clayborne Ly RN BSN CCM [place]  [facility], [facility] Nurse Care Coordinator  Direct Dial: [phone] Website: [email or web address]

## 2024-09-14 NOTE — Patient Outreach (Signed)
 Complex Care Management   Visit Note  09/14/2024  Name:  Katie Potts MRN: [id number] DOB: 1951-12-15  Situation: Referral received for Complex Care Management related to Heart Failure and Palpitations, Constipation, Class 1 Obesity, Sciatica pain, Asthma, stage 2 CKD, Essential Hypertension, bilateral breast pain, chronic back pain, bilateral hand pain. I obtained verbal consent from Patient.  Visit completed with Patient on the phone.  Background:   Past Medical History:  Diagnosis Date   Asthma    Chest pain of uncertain etiology 03/16/2021   Depression    MAJOR DEPRESSION, DR. KAUR;PSYCHIATRIST   HTN (hypertension) 07/06/2019   Hyperlipidemia    Hypertensive heart disease with chronic diastolic congestive heart failure (HCC) 07/06/2019   Hypokalemia 03/16/2021   Snoring 03/16/2021    Assessment: Patient Reported Symptoms:  Cognitive Cognitive Status: Alert and oriented to person, place, and time, Normal speech and language skills Cognitive/Intellectual Conditions Management [RPT]: None reported or documented in medical history or problem list   Health Maintenance Behaviors: Annual physical exam, Healthy diet Health Facilitated by: Healthy diet, Rest, Pain control  Neurological Neurological Review of Symptoms: Numbness, Weakness Neurological Management Strategies: Routine screening, Medication therapy Neurological Self-Management Outcome: 3 (uncertain) Neurological Comment: weakness in her hands, with difficulty holding objects and frequent dropping of items. She describes numbness, tingling, and pain in her hands, which began approximately four to five months ago. PCP aware of sympotms.  HEENT HEENT Symptoms Reported: Not assessed      Cardiovascular Cardiovascular Symptoms Reported: No symptoms reported Does patient have uncontrolled Hypertension?: No Cardiovascular Management Strategies: Routine screening, Medication therapy Cardiovascular Self-Management Outcome: 4  (good) Cardiovascular Comment: Reviewed upcoming scheduled appointments for cardiac evaluation  Respiratory Respiratory Symptoms Reported: Not assesed    Endocrine Endocrine Symptoms Reported: Not assessed    Gastrointestinal Gastrointestinal Symptoms Reported: Constipation Gastrointestinal Management Strategies: Diet modification Gastrointestinal Self-Management Outcome: 3 (uncertain)    Genitourinary Genitourinary Symptoms Reported: Not assessed    Integumentary Integumentary Symptoms Reported: Not assessed    Musculoskeletal Musculoskelatal Symptoms Reviewed: Muscle pain, Joint pain, Weakness, Back pain Additional Musculoskeletal Details: weakness in her hands, with difficulty holding objects and frequent dropping of items. She describes numbness, tingling, and pain in her hands, which began approximately four to five months ago. She has not seen an orthopedic or hand specialist for these symptoms. Her sister has rheumatoid arthritis. Musculoskeletal Management Strategies: Adequate rest, Medication therapy, Routine screening Musculoskeletal Self-Management Outcome: 3 (uncertain)      Psychosocial Psychosocial Symptoms Reported: No symptoms reported   Major Change/Loss/Stressor/Fears (CP): Medical condition, family Techniques to [place] with Loss/Stress/Change: Diversional activities Quality of Family Relationships: supportive, helpful, involved Do you feel physically threatened by others?: No    09/14/2024    PHQ2-9 Depression Screening   Enda Santo interest or pleasure in doing things    Feeling down, depressed, or hopeless    PHQ-2 - Total Score    Trouble falling or staying asleep, or sleeping too much    Feeling tired or having Cambren Helm energy    Poor appetite or overeating     Feeling bad about yourself - or that you are a failure or have let yourself or your family down    Trouble concentrating on things, such as reading the newspaper or watching television    Moving or speaking  so slowly that other people could have noticed.  Or the opposite - being so fidgety or restless that you have been moving around a lot more than usual    Thoughts  that you would be better off dead, or hurting yourself in some way    PHQ2-9 Total Score    If you checked off any problems, how difficult have these problems made it for you to do your work, take care of things at home, or get along with other people    Depression Interventions/Treatment      There were no vitals filed for this visit.  Medications Reviewed Today     Reviewed by Morgan Clayborne CROME, RN (Registered Nurse) on 09/14/24 at 1050  Med List Status: <None>   Medication Order Taking? Sig Documenting Provider Last Dose Status Informant  acetaminophen  (TYLENOL ) 500 MG tablet [id number]  Take 500 mg by mouth every 6 (six) hours as needed for moderate pain (pain score 4-6). Patient is taking 1-2 tablets as needed for mild to moderate back pain and hand pain. [provider]  Active   albuterol  (VENTOLIN  HFA) 108 (90 Base) MCG/ACT inhaler [id number]  Inhale 2 puffs into the lungs every 6 (six) hours as needed for wheezing or shortness of breath. Georgina Speaks, FNP  Active   aspirin EC 81 MG tablet [id number]  Take 81 mg by mouth daily. [provider]  Active   Cholecalciferol (VITAMIN D3) 125 MCG (5000 UT) CAPS [id number]  Take 5,000 Units by mouth daily. [provider]  Active   clonazePAM (KLONOPIN) 1 MG tablet [id number]  Take 1 mg by mouth 2 (two) times daily. 1/2 tablet by mouth up to twice daily as needed for anxiety or panic attacks. [provider]  Active   diphenhydrAMINE  HCl (BENADRYL  ALLERGY PO) [id number]  Take 2 tablets by mouth as needed. [provider]  Active   Evolocumab  (REPATHA  SURECLICK) 140 MG/ML SOAJ [id number]  Inject 140 mg into the skin every 14 (fourteen) days. Raford Riggs, MD  Active   FLUoxetine (PROZAC) 20 MG capsule [id number]  Take 40 mg by mouth every  morning.  Patient taking differently: Take 20 mg by mouth 2 (two) times daily.   [provider]  Active   hydrALAZINE  (APRESOLINE ) 50 MG tablet [id number]  Take 1 tablet (50 mg total) by mouth 2 (two) times daily. Raford Riggs, MD  Expired 09/12/24 2359   hydrochlorothiazide  (HYDRODIURIL ) 12.5 MG tablet [id number]  TAKE 1 TABLET(12.5 MG) BY MOUTH DAILY Georgina Speaks, FNP  Active   metoprolol  tartrate (LOPRESSOR ) 100 MG tablet [id number]  Take 1 tablet (100 mg total) by mouth once for 1 dose. Take 90-120 minutes prior to scan. Hold for SBP less than 110. Raford Riggs, MD  Expired 09/12/24 2359   NEXLETOL  180 MG TABS [id number]  TAKE 1 TABLET(180 MG) BY MOUTH DAILY Raford Riggs, MD  Active   NIFEdipine  (PROCARDIA -XL/NIFEDICAL-XL) 30 MG 24 hr tablet [id number] Yes Take 1 tablet (30 mg total) by mouth daily. Raford Riggs, MD  Active   semaglutide -weight management (WEGOVY ) 1 MG/0.5ML SOAJ SQ injection [id number]  Inject 1 mg into the skin once a week. Raford Riggs, MD  Active   zolpidem (AMBIEN) 10 MG tablet [id number]  Take 10 mg by mouth at bedtime as needed for sleep. [provider]  Active             Recommendation:   Specialty provider follow-up for Cardiac CT on 09/17/24 at 10:00 AM per Dr. Raford [facility] CT IMAGING [address] Complete your Echocardiogram on 10/12/24 at 10:00 AM per Dr. Raford [facility] CV IMAGING   Follow Up Plan:   Telephone follow up appointment date/time:  Tuesday, October 28 at 10:30 AM  Clayborne Ly RN BSN CCM [facility]  [facility], [facility] Nurse Care Coordinator  Direct Dial: [phone] Website: [email or web address]

## 2024-09-16 ENCOUNTER — Encounter (HOSPITAL_BASED_OUTPATIENT_CLINIC_OR_DEPARTMENT_OTHER): Payer: Self-pay | Admitting: Cardiovascular Disease

## 2024-09-16 DIAGNOSIS — R0602 Shortness of breath: Secondary | ICD-10-CM | POA: Insufficient documentation

## 2024-09-17 ENCOUNTER — Ambulatory Visit (HOSPITAL_COMMUNITY): Admission: RE | Admit: 2024-09-17 | Source: Ambulatory Visit

## 2024-09-17 LAB — BMP8+EGFR
BUN/Creatinine Ratio: 17 (ref 12–28)
BUN: 21 mg/dL (ref 8–27)
CO2: 26 mmol/L (ref 20–29)
Calcium: 10.2 mg/dL (ref 8.7–10.3)
Chloride: 97 mmol/L (ref 96–106)
Creatinine, Ser: 1.27 mg/dL — ABNORMAL HIGH (ref 0.57–1.00)
Glucose: 75 mg/dL (ref 70–99)
Potassium: 3.3 mmol/L — ABNORMAL LOW (ref 3.5–5.2)
Sodium: 143 mmol/L (ref 134–144)
eGFR: 45 mL/min/1.73 — ABNORMAL LOW (ref >59)

## 2024-09-17 LAB — MULTIPLE MYELOMA PANEL, SERUM
Albumin SerPl Elph-Mcnc: 4 g/dL (ref 2.9–4.4)
Albumin/Glob SerPl: 1.3 (ref 0.7–1.7)
Alpha 1: 0.2 g/dL (ref 0.0–0.4)
Alpha2 Glob SerPl Elph-Mcnc: 0.7 g/dL (ref 0.4–1.0)
B-Globulin SerPl Elph-Mcnc: 1.2 g/dL (ref 0.7–1.3)
Gamma Glob SerPl Elph-Mcnc: 1 g/dL (ref 0.4–1.8)
Globulin, Total: 3.1 g/dL (ref 2.2–3.9)
IgA/Immunoglobulin A, Serum: 442 mg/dL — ABNORMAL HIGH (ref 64–422)
IgG (Immunoglobin G), Serum: 1147 mg/dL (ref 586–1602)
IgM (Immunoglobulin M), Srm: 40 mg/dL (ref 26–217)
Total Protein: 7.1 g/dL (ref 6.0–8.5)

## 2024-09-17 LAB — AUTOIMMUNE PROFILE
Anti Nuclear Antibody (ANA): NEGATIVE
Complement C3, Serum: 135 mg/dL (ref 82–167)
dsDNA Ab: <1 [IU]/mL (ref 0–9)

## 2024-09-19 ENCOUNTER — Other Ambulatory Visit: Payer: Self-pay | Admitting: Cardiovascular Disease

## 2024-09-19 DIAGNOSIS — E66811 Obesity, class 1: Secondary | ICD-10-CM

## 2024-09-24 NOTE — Telephone Encounter (Signed)
 Patient would like to stay on 1mg  dose

## 2024-09-25 ENCOUNTER — Ambulatory Visit (HOSPITAL_COMMUNITY)
Admission: RE | Admit: 2024-09-25 | Discharge: 2024-09-25 | Disposition: A | Discharge status: Home / Self Care | Source: Ambulatory Visit | Attending: Cardiovascular Disease | Admitting: Cardiovascular Disease

## 2024-09-25 DIAGNOSIS — I5032 Chronic diastolic (congestive) heart failure: Secondary | ICD-10-CM | POA: Insufficient documentation

## 2024-09-25 DIAGNOSIS — I2510 Atherosclerotic heart disease of native coronary artery without angina pectoris: Secondary | ICD-10-CM | POA: Diagnosis not present

## 2024-09-25 DIAGNOSIS — I2584 Coronary atherosclerosis due to calcified coronary lesion: Secondary | ICD-10-CM | POA: Insufficient documentation

## 2024-09-25 DIAGNOSIS — R0609 Other forms of dyspnea: Secondary | ICD-10-CM | POA: Diagnosis not present

## 2024-09-25 DIAGNOSIS — I110 Hypertensive heart disease with heart failure: Secondary | ICD-10-CM | POA: Insufficient documentation

## 2024-09-25 MED ORDER — IOHEXOL 350 MG/ML SOLN
100.0000 mL | Freq: Once | INTRAVENOUS | Status: AC | PRN
  Administered 2024-09-25: 100 mL via INTRAVENOUS

## 2024-09-25 MED ORDER — NITROGLYCERIN 0.4 MG SL SUBL
0.8000 mg | SUBLINGUAL_TABLET | Freq: Once | SUBLINGUAL | Status: AC
  Administered 2024-09-25: 0.8 mg via SUBLINGUAL

## 2024-09-27 ENCOUNTER — Ambulatory Visit (HOSPITAL_BASED_OUTPATIENT_CLINIC_OR_DEPARTMENT_OTHER): Payer: Self-pay | Admitting: Family

## 2024-09-27 DIAGNOSIS — I10 Essential (primary) hypertension: Secondary | ICD-10-CM

## 2024-09-27 DIAGNOSIS — E876 Hypokalemia: Secondary | ICD-10-CM

## 2024-09-27 DIAGNOSIS — Z5181 Encounter for therapeutic drug level monitoring: Secondary | ICD-10-CM

## 2024-09-27 DIAGNOSIS — Z79899 Other long term (current) drug therapy: Secondary | ICD-10-CM

## 2024-09-27 MED ORDER — POTASSIUM CHLORIDE CRYS ER 20 MEQ PO TBCR: EXTENDED_RELEASE_TABLET | ORAL | 0 refills | Status: DC

## 2024-10-01 ENCOUNTER — Telehealth: Payer: Self-pay

## 2024-10-01 NOTE — Telephone Encounter (Signed)
 Copied from CRM [id number]. Topic: Clinical - Lab/Test Results >> Sep 28, 2024 12:39 PM Katie Potts wrote: Reason for CRM: Patient is calling again for the results of her labs from 09/13/24. Is requesting a call back to discuss.  Patient can be reached at [phone]

## 2024-10-03 ENCOUNTER — Ambulatory Visit: Payer: Self-pay | Admitting: Nurse Practitioner

## 2024-10-03 ENCOUNTER — Other Ambulatory Visit: Payer: Self-pay | Admitting: Nurse Practitioner

## 2024-10-03 DIAGNOSIS — R778 Other specified abnormalities of plasma proteins: Secondary | ICD-10-CM

## 2024-10-03 DIAGNOSIS — N1831 Chronic kidney disease, stage 3a: Secondary | ICD-10-CM

## 2024-10-05 ENCOUNTER — Ambulatory Visit: Payer: Self-pay

## 2024-10-05 NOTE — Telephone Encounter (Signed)
 FYI Only or Action Required?: Action required by provider: clinical question for provider.  Patient was last seen in primary care on 08/29/2024 by Georgina Speaks, FNP.  Called Nurse Triage reporting Advice Only.  Symptoms began N/A.  Interventions attempted: Other: N/A.  Symptoms are: N/A.  Triage Disposition: Information or Advice Only Call  Patient/caregiver understands and will follow disposition?: Yes  Copied from CRM #[id number]. Topic: Clinical - Lab/Test Results >> Oct 05, 2024  1:20 PM Antwanette L wrote: The patient is calling back to speak with Ciera regarding lab results from 9/25. However, the office closed today at 12 PM. Reason for Disposition  Health information question, no triage required and triager able to answer question  Answer Assessment - Initial Assessment Questions Pt states she was notified yesterday based off labs she has Stage 3 Kidney disease. RN relayed information written by provider based on results. Pt asks if she should continue her potassium supplement since she was told only to take it for 2 days.   1. REASON FOR CALL: What is the main reason for your call? or How can I best help you?     Pt states she would like to re-discuss lab results because after being told she has stage 3 kidney disease, she shut down at that point in the conversation and does not recall.  2. SYMPTOMS : Do you have any symptoms?      denies 3. OTHER QUESTIONS: Do you have any other questions?     denies  Protocols used: Information Only Call - No Triage-A-AH

## 2024-10-10 ENCOUNTER — Other Ambulatory Visit: Payer: Self-pay | Admitting: Nurse Practitioner

## 2024-10-10 MED ORDER — POTASSIUM CHLORIDE CRYS ER 20 MEQ PO TBCR: EXTENDED_RELEASE_TABLET | ORAL | 2 refills | Status: DC

## 2024-10-10 NOTE — Telephone Encounter (Signed)
 Rx sent.

## 2024-10-12 ENCOUNTER — Ambulatory Visit (INDEPENDENT_AMBULATORY_CARE_PROVIDER_SITE_OTHER)

## 2024-10-12 DIAGNOSIS — I110 Hypertensive heart disease with heart failure: Secondary | ICD-10-CM | POA: Diagnosis not present

## 2024-10-12 DIAGNOSIS — I5032 Chronic diastolic (congestive) heart failure: Secondary | ICD-10-CM | POA: Diagnosis not present

## 2024-10-12 DIAGNOSIS — R0609 Other forms of dyspnea: Secondary | ICD-10-CM

## 2024-10-12 LAB — ECHOCARDIOGRAM COMPLETE
Area-P 1/2: 4.68 cm2
S' Lateral: 1.91 cm

## 2024-10-13 ENCOUNTER — Other Ambulatory Visit (HOSPITAL_BASED_OUTPATIENT_CLINIC_OR_DEPARTMENT_OTHER): Payer: Self-pay | Admitting: Cardiovascular Disease

## 2024-10-16 ENCOUNTER — Other Ambulatory Visit: Payer: Self-pay

## 2024-10-16 VITALS — BP 134/65 | HR 72

## 2024-10-16 DIAGNOSIS — I110 Hypertensive heart disease with heart failure: Secondary | ICD-10-CM

## 2024-10-16 DIAGNOSIS — K5904 Chronic idiopathic constipation: Secondary | ICD-10-CM

## 2024-10-16 NOTE — Patient Instructions (Signed)
 Visit Information  Thank you for taking time to visit with me today. Please don't hesitate to contact me if I can be of assistance to you before our next scheduled appointment.  Your next care management appointment is by telephone on Tuesday, November 25 at 10:30 AM  Please call the care guide team at [phone] if you need to cancel, schedule, or reschedule an appointment.   Please call 1-800-273-TALK (toll free, 24 hour hotline) if you are experiencing a Mental Health or Behavioral Health Crisis or need someone to talk to.  Clayborne Ly RN BSN CCM [place]  [facility], [facility] Nurse Care Coordinator  Direct Dial: [phone] Website: [email or web address]

## 2024-10-16 NOTE — Patient Outreach (Signed)
 Complex Care Management   Visit Note  10/16/2024  Name:  Katie Potts MRN: [id number] DOB: 02/03/1951  Situation: Referral received for Complex Care Management related to Heart Failure and Palpitations, Constipation, Class 1 Obesity, Sciatica pain, Asthma, stage 2 CKD, Essential Hypertension, bilateral breast pain, chronic back pain, bilateral hand pain, Grief, Stress related to condition of a family member. I obtained verbal consent from Patient.  Visit completed with Patient on the phone.  Background:   Past Medical History:  Diagnosis Date   Asthma    Chest pain of uncertain etiology 03/16/2021   Depression    MAJOR DEPRESSION, DR. KAUR;PSYCHIATRIST   HTN (hypertension) 07/06/2019   Hyperlipidemia    Hypertensive heart disease with chronic diastolic congestive heart failure (HCC) 07/06/2019   Hypokalemia 03/16/2021   Shortness of breath 09/16/2024   Snoring 03/16/2021    Assessment: Patient Reported Symptoms:  Cognitive Cognitive Status: Alert and oriented to person, place, and time, Normal speech and language skills Cognitive/Intellectual Conditions Management [RPT]: None reported or documented in medical history or problem list   Health Maintenance Behaviors: Annual physical exam, Healthy diet, Stress management Health Facilitated by: Healthy diet, Rest, Stress management  Neurological Neurological Review of Symptoms: No symptoms reported    HEENT HEENT Symptoms Reported: No symptoms reported      Cardiovascular Cardiovascular Symptoms Reported: No symptoms reported Does patient have uncontrolled Hypertension?: No Cardiovascular Management Strategies: Routine screening, Medication therapy Cardiovascular Self-Management Outcome: 4 (good)  Respiratory Respiratory Symptoms Reported: No symptoms reported    Endocrine Endocrine Symptoms Reported: No symptoms reported Is patient diabetic?: No    Gastrointestinal Gastrointestinal Symptoms Reported:  Constipation Gastrointestinal Management Strategies: Medication therapy, Diet modification Gastrointestinal Self-Management Outcome: 3 (uncertain)    Genitourinary Genitourinary Symptoms Reported: No symptoms reported    Integumentary Integumentary Symptoms Reported: No symptoms reported    Musculoskeletal Musculoskelatal Symptoms Reviewed: Not assessed        Psychosocial Psychosocial Symptoms Reported: No symptoms reported   Major Change/Loss/Stressor/Fears (CP): Medical condition, family, Medical condition, self Techniques to Cope with Loss/Stress/Change: Diversional activities (scheduled a telephone visit with Rolin Kerns LCSW) Quality of Family Relationships: helpful, supportive, involved Do you feel physically threatened by others?: No    10/16/2024    PHQ2-9 Depression Screening   Naida Escalante interest or pleasure in doing things    Feeling down, depressed, or hopeless    PHQ-2 - Total Score    Trouble falling or staying asleep, or sleeping too much    Feeling tired or having Mohamadou Maciver energy    Poor appetite or overeating     Feeling bad about yourself - or that you are a failure or have let yourself or your family down    Trouble concentrating on things, such as reading the newspaper or watching television    Moving or speaking so slowly that other people could have noticed.  Or the opposite - being so fidgety or restless that you have been moving around a lot more than usual    Thoughts that you would be better off dead, or hurting yourself in some way    PHQ2-9 Total Score    If you checked off any problems, how difficult have these problems made it for you to do your work, take care of things at home, or get along with other people    Depression Interventions/Treatment      Vitals:   10/16/24 1201  BP: 134/65  Pulse: 72    Medications Reviewed Today  Reviewed by Morgan Clayborne CROME, RN (Registered Nurse) on 10/16/24 at 1157  Med List Status: <None>   Medication  Order Taking? Sig Documenting Provider Last Dose Status Informant  acetaminophen  (TYLENOL ) 500 MG tablet [id number]  Take 500 mg by mouth every 6 (six) hours as needed for moderate pain (pain score 4-6). Patient is taking 1-2 tablets as needed for mild to moderate back pain and hand pain. [provider]  Active   albuterol  (VENTOLIN  HFA) 108 (90 Base) MCG/ACT inhaler [id number]  Inhale 2 puffs into the lungs every 6 (six) hours as needed for wheezing or shortness of breath. Georgina Speaks, FNP  Active   aspirin EC 81 MG tablet [id number]  Take 81 mg by mouth daily. [provider]  Active   Cholecalciferol (VITAMIN D3) 125 MCG (5000 UT) CAPS [id number]  Take 5,000 Units by mouth daily. [provider]  Active   clonazePAM (KLONOPIN) 1 MG tablet [id number]  Take 1 mg by mouth 2 (two) times daily. 1/2 tablet by mouth up to twice daily as needed for anxiety or panic attacks. [provider]  Active   diphenhydrAMINE  HCl (BENADRYL  ALLERGY PO) [id number]  Take 2 tablets by mouth as needed. [provider]  Active   Evolocumab  (REPATHA  SURECLICK) 140 MG/ML SOAJ [id number]  Inject 140 mg into the skin every 14 (fourteen) days. Raford Riggs, MD  Active   FLUoxetine (PROZAC) 20 MG capsule [id number]  Take 40 mg by mouth every morning.  Patient taking differently: Take 20 mg by mouth 2 (two) times daily.   [provider]  Active   hydrALAZINE  (APRESOLINE ) 50 MG tablet [id number]  Take 1 tablet (50 mg total) by mouth 2 (two) times daily. Raford Riggs, MD  Expired 09/12/24 2359   hydrochlorothiazide  (HYDRODIURIL ) 12.5 MG tablet [id number]  TAKE 1 TABLET(12.5 MG) BY MOUTH DAILY Georgina Speaks, FNP  Active   metoprolol  tartrate (LOPRESSOR ) 100 MG tablet [id number]  Take 1 tablet (100 mg total) by mouth once for 1 dose. Take 90-120 minutes prior to scan. Hold for SBP less than 110. Raford Riggs, MD  Expired 09/12/24 2359   NEXLETOL  180 MG TABS [id number]  TAKE  1 TABLET(180 MG) BY MOUTH DAILY Raford Riggs, MD  Active   NIFEdipine  (PROCARDIA -XL/NIFEDICAL-XL) 30 MG 24 hr tablet [id number]  TAKE 1 TABLET(30 MG) BY MOUTH DAILY Raford Riggs, MD  Active   potassium chloride SA (KLOR-CON M20) 20 MEQ tablet [id number]  Daily for two days only and then as directed by physician Georgina Speaks, FNP  Active   semaglutide -weight management (WEGOVY ) 1 MG/0.5ML SOAJ SQ injection [id number]  ADMINISTER 1 MG UNDER THE SKIN 1 TIME A WEEK Raford Riggs, MD  Active   zolpidem (AMBIEN) 10 MG tablet [id number]  Take 10 mg by mouth at bedtime as needed for sleep. [provider]  Active             Recommendation:   Specialty provider follow-up on 10/17/24 at 09:00 AM with   9:00 AM Length: 40   Visit Type: MR BREAST BILAT WWO CONTRAS-GI [[id number]] Copay: $0.00  Provider: [facility]-[id number] MR 1 Department: HP-684 MRI   Specialty provider follow-up on 10/25/24 at 11:00 AM with  OFFICE VISIT [1004] Copay: $20.00   Provider: Herschel Allean CROME, RPH-CPP Department: [facility]    Follow Up Plan:   Telephone follow up appointment date/time:  Tuesday, November 25 at 10:30 AM Referral to Licensed Clinical Social Worker  3:00 PM Length: 60   Visit Type:  VBCI TELEPHONE CALL 60 [2503] Copay: $0.00  Provider: Ezzard Rolin BIRCH, LCSW Department: [facility]    Clayborne Ly RN BSN CCM [facility]  [facility] Nurse Care Coordinator  Direct Dial: [phone] Website: [email or web address]

## 2024-10-17 ENCOUNTER — Other Ambulatory Visit: Payer: Self-pay | Admitting: Nurse Practitioner

## 2024-10-17 ENCOUNTER — Inpatient Hospital Stay
Admission: RE | Admit: 2024-10-17 | Discharge: 2024-10-17 | Disposition: A | Source: Ambulatory Visit | Attending: Nurse Practitioner | Admitting: Nurse Practitioner

## 2024-10-17 DIAGNOSIS — E782 Mixed hyperlipidemia: Secondary | ICD-10-CM

## 2024-10-17 DIAGNOSIS — N644 Mastodynia: Secondary | ICD-10-CM

## 2024-10-17 DIAGNOSIS — Z803 Family history of malignant neoplasm of breast: Secondary | ICD-10-CM | POA: Diagnosis not present

## 2024-10-17 MED ORDER — GADOPICLENOL 0.5 MMOL/ML IV SOLN
6.0000 mL | Freq: Once | INTRAVENOUS | Status: AC | PRN
  Administered 2024-10-17: 6 mL via INTRAVENOUS

## 2024-10-17 MED ORDER — POTASSIUM CHLORIDE CRYS ER 20 MEQ PO TBCR: EXTENDED_RELEASE_TABLET | ORAL | 2 refills | Status: DC

## 2024-10-19 ENCOUNTER — Other Ambulatory Visit: Payer: Self-pay | Admitting: Nurse Practitioner

## 2024-10-19 DIAGNOSIS — R778 Other specified abnormalities of plasma proteins: Secondary | ICD-10-CM

## 2024-10-25 ENCOUNTER — Ambulatory Visit (HOSPITAL_BASED_OUTPATIENT_CLINIC_OR_DEPARTMENT_OTHER): Admitting: Pharmacist Clinician (PhC)/ Clinical Pharmacy Specialist

## 2024-10-25 ENCOUNTER — Encounter (HOSPITAL_BASED_OUTPATIENT_CLINIC_OR_DEPARTMENT_OTHER): Payer: Self-pay | Admitting: Pharmacist Clinician (PhC)/ Clinical Pharmacy Specialist

## 2024-10-25 VITALS — BP 118/80 | HR 91 | Ht 61.0 in | Wt 133.2 lb

## 2024-10-25 DIAGNOSIS — I110 Hypertensive heart disease with heart failure: Secondary | ICD-10-CM | POA: Diagnosis not present

## 2024-10-25 DIAGNOSIS — I5032 Chronic diastolic (congestive) heart failure: Secondary | ICD-10-CM | POA: Diagnosis not present

## 2024-10-25 LAB — BASIC METABOLIC PANEL WITH GFR
BUN/Creatinine Ratio: 9 — ABNORMAL LOW (ref 12–28)
BUN: 13 mg/dL (ref 8–27)
CO2: 25 mmol/L (ref 20–29)
Calcium: 10.1 mg/dL (ref 8.7–10.3)
Chloride: 100 mmol/L (ref 96–106)
Creatinine, Ser: 1.39 mg/dL — ABNORMAL HIGH (ref 0.57–1.00)
Glucose: 77 mg/dL (ref 70–99)
Potassium: 3.4 mmol/L — ABNORMAL LOW (ref 3.5–5.2)
Sodium: 140 mmol/L (ref 134–144)
eGFR: 41 mL/min/1.73 — ABNORMAL LOW (ref >59)

## 2024-10-25 NOTE — Assessment & Plan Note (Signed)
 Assessment: BP is controlled in office BP 118/80 mmHg;  Tolerates nifedipine , hydralazine  and hydrochlorothiazide  well, without any side effects Denies SOB, palpitation, chest pain, headaches,or swelling Reiterated the importance of regular exercise and low salt diet   Plan:  Continue taking hctz 12.5 mg every day, hydralazine  50 mg bid, nifedipine  xl 30 mg daily Patient to keep record of BP readings with heart rate and report to us  at the next visit Patient to follow up with Dr. Raford in March  Labs ordered today:  none

## 2024-10-25 NOTE — Progress Notes (Signed)
 10/25/2024 Katie Potts 01-May-1951 [id number]   HPI:  Katie Potts is a 73 y.o. female patient of Dr Raford, with a PMH below who presents today for hypertension management.  She had [facility], but now is following up with Dr. Raford as her primary cardiologist, as Dr. Claudene has retired.   We have worked with her using various medications without much success.  In June we tried nifedipine  xl 30 mg daily in addition to her hydralazine  and hydrochlorothiazide .  Pressure was still elevated in September (154/84), but has dropped since.   Today she is in the office for follow up.  States home readings have been good lately and she is feeling well overall.     Past Medical History: ASCVD CAC score 73 (80th percentile), prior TIA  hyperlipidemia LDL at 124 (10/21) on Repatha , Nexletol   Diastolic HF chronic     Blood Pressure Goal:  130/80  Current Medications: hctz 12.5 mg every day, hydralazine  50 mg bid  Family Hx: mother died at 72 cancer, dad at 38 stroke, both with hypertension, 4 siblings, brother died form MI at 39, others all with hypertension, 1 son, now deceased,   Social Hx: no tobacco, occasional alcohol, drinks occasional decaf coffee  Diet: tries to eat home cooked, but travelling a lot to sister in [place], plenty of chicken (rotissire all week) with vegetables (frozen), occasional rice;  doesn't snack much   Exercise: not currently, tried walking about 5 minutes, felt tired. Moving about more, decorating in the house  Home BP readings:     No home readings with her today, but states when checked, were mostly WNL.  Intolerances: amlodipine  -headaches, edema;  losartan  - myalgias  olmesartan didn't work  carvedilol  - asthma flare Spironolactone  - increase in SCr  Labs: 12/24:  Na 140, K 3.9, Glu 90, SCr 1.15, GFR 51   Wt Readings from Last 3  Encounters:  10/25/24 133 lb 3.2 oz (60.4 kg)  09/12/24 136 lb (61.7 kg)  08/29/24 143 lb (64.9 kg)   BP Readings from Last 3 Encounters:  10/25/24 118/80  10/16/24 134/65  09/25/24 133/82   Pulse Readings from Last 3 Encounters:  10/25/24 91  10/16/24 72  09/25/24 67    Current Outpatient Medications  Medication Sig Dispense Refill   acetaminophen  (TYLENOL ) 500 MG tablet Take 500 mg by mouth every 6 (six) hours as needed for moderate pain (pain score 4-6). Patient is taking 1-2 tablets as needed for mild to moderate back pain and hand pain.     albuterol  (VENTOLIN  HFA) 108 (90 Base) MCG/ACT inhaler Inhale 2 puffs into the lungs every 6 (six) hours as needed for wheezing or shortness of breath. 18 g 3   aspirin EC 81 MG tablet Take 81 mg by mouth daily.     Cholecalciferol (VITAMIN D3) 125 MCG (5000 UT) CAPS Take 5,000 Units by mouth daily.     clonazePAM (KLONOPIN) 1 MG tablet Take 1 mg by mouth 2 (two) times daily. 1/2 tablet by mouth up to twice daily as needed for anxiety or panic attacks.     diphenhydrAMINE  HCl (BENADRYL  ALLERGY PO) Take 2 tablets by mouth as needed.     Evolocumab  (REPATHA  SURECLICK) 140 MG/ML SOAJ Inject 140 mg into the skin every 14 (fourteen) days. 6 mL 3   FLUoxetine (PROZAC) 20 MG capsule Take 40 mg by mouth every morning.     hydrALAZINE  (APRESOLINE ) 50 MG tablet  Take 1 tablet (50 mg total) by mouth 2 (two) times daily. 180 tablet 1   hydrochlorothiazide  (HYDRODIURIL ) 12.5 MG tablet TAKE 1 TABLET(12.5 MG) BY MOUTH DAILY 90 tablet 1   NEXLETOL  180 MG TABS TAKE 1 TABLET(180 MG) BY MOUTH DAILY 30 tablet 11   NIFEdipine  (PROCARDIA -XL/NIFEDICAL-XL) 30 MG 24 hr tablet TAKE 1 TABLET(30 MG) BY MOUTH DAILY 30 tablet 10   potassium chloride SA (KLOR-CON M20) 20 MEQ tablet Take 1 tablet by mouth daily 30 tablet 2   semaglutide -weight management (WEGOVY ) 1 MG/0.5ML SOAJ SQ injection ADMINISTER 1 MG UNDER THE SKIN 1 TIME A WEEK 2 mL 11   zolpidem (AMBIEN) 10 MG tablet  Take 10 mg by mouth at bedtime as needed for sleep.     No current facility-administered medications for this visit.    Allergies  Allergen Reactions   Amlodipine  Swelling and Other (See Comments)   Coreg  [Carvedilol ]     MADE HER FEEL LIKE SHE WAS GOING TO FAINT    Losartan      myalgias   Pravastatin     myalgias   Rosuvastatin      myalgias   Seroquel [Quetiapine Fumarate] Swelling   Spironolactone      Abnormal kidney functions     Past Medical History:  Diagnosis Date   Asthma    Chest pain of uncertain etiology 03/16/2021   Depression    MAJOR DEPRESSION, DR. KAUR;PSYCHIATRIST   HTN (hypertension) 07/06/2019   Hyperlipidemia    Hypertensive heart disease with chronic diastolic congestive heart failure (HCC) 07/06/2019   Hypokalemia 03/16/2021   Shortness of breath 09/16/2024   Snoring 03/16/2021    Blood pressure 118/80, pulse 91, height 5' 1 (1.549 m), weight 133 lb 3.2 oz (60.4 kg), SpO2 98%.  Hypertensive heart disease with chronic diastolic congestive heart failure (HCC) Assessment: BP is controlled in office BP 118/80 mmHg;  Tolerates nifedipine , hydralazine  and hydrochlorothiazide  well, without any side effects Denies SOB, palpitation, chest pain, headaches,or swelling Reiterated the importance of regular exercise and low salt diet   Plan:  Continue taking hctz 12.5 mg every day, hydralazine  50 mg bid, nifedipine  xl 30 mg daily Patient to keep record of BP readings with heart rate and report to us  at the next visit Patient to follow up with Dr. Raford in March  Labs ordered today:  none    Allean Mink PharmD CPP [facility] [address] [place], [place] [address]

## 2024-10-25 NOTE — Patient Instructions (Signed)
 Follow up appointment: with Dr. Raford in March (not ADV HTN)  Go to the lab today and check metabolic panel for [place].  Please call their office to let them know to watch for this and let you know if you should be on potassium supplementation daily or not.  Take your BP meds as follows: continue with your current medications  Check your blood pressure at home daily (if able) and keep record of the readings.  Your blood pressure goal is < 130/80  To check your pressure at home you will need to:  1. Sit up in a chair, with feet flat on the floor and back supported. Do not cross your ankles or legs. 2. Rest your left arm so that the cuff is about heart level. If the cuff goes on your upper arm,  then just relax the arm on the table, arm of the chair or your lap. If you have a wrist cuff, we  suggest relaxing your wrist against your chest (think of it as Pledging the Flag with the  wrong arm).  3. Place the cuff snugly around your arm, about 1 inch above the crook of your elbow. The  cords should be inside the groove of your elbow.  4. Sit quietly, with the cuff in place, for about 5 minutes. After that 5 minutes press the power  button to start a reading. 5. Do not talk or move while the reading is taking place.  6. Record your readings on a sheet of paper. Although most cuffs have a memory, it is often  easier to see a pattern developing when the numbers are all in front of you.  7. You can repeat the reading after 1-3 minutes if it is recommended  Make sure your bladder is empty and you have not had caffeine or tobacco within the last 30 min  Always bring your blood pressure log with you to your appointments. If you have not brought your monitor in to be double checked for accuracy, please bring it to your next appointment.  You can find a list of quality blood pressure cuffs at [email or web address]  Important lifestyle changes to control high blood pressure  Intervention  Effect  on the BP  Lose extra pounds and watch your waistline Weight loss is one of the most effective lifestyle changes for controlling blood pressure. If you're overweight or obese, losing even a small amount of weight can help reduce blood pressure. Blood pressure might go down by about 1 millimeter of mercury (mm Hg) with each kilogram (about 2.2 pounds) of weight lost.  Exercise regularly As a general goal, aim for at least 30 minutes of moderate physical activity every day. Regular physical activity can lower high blood pressure by about 5 to 8 mm Hg.  Eat a healthy diet Eating a diet rich in whole grains, fruits, vegetables, and low-fat dairy products and low in saturated fat and cholesterol. A healthy diet can lower high blood pressure by up to 11 mm Hg.  Reduce salt (sodium) in your diet Even a small reduction of sodium in the diet can improve heart health and reduce high blood pressure by about 5 to 6 mm Hg.  Limit alcohol One drink equals 12 ounces of beer, 5 ounces of wine, or 1.5 ounces of 80-proof liquor.  Limiting alcohol to less than one drink a day for women or two drinks a day for men can help lower blood pressure by about 4 mm Hg.  If you have any questions or concerns please use My Chart to send questions or call the office at [phone]

## 2024-10-26 MED ORDER — POTASSIUM CHLORIDE CRYS ER 20 MEQ PO TBCR: 20.0000 meq | EXTENDED_RELEASE_TABLET | Freq: Two times a day (BID) | ORAL | 0 refills | Status: DC

## 2024-10-26 NOTE — Telephone Encounter (Signed)
-----   Message from Reche GORMAN Finder sent at 10/26/2024  8:35 AM EST ----- Potassium persistently low. Please advise to increase potassium to 20mEq twice per day and repeat BMET in 1 week. Ensure staying well hydrated for protection of kidney function. ----- Message ----- From: Interface, [facility] Lab Results In Sent: 10/25/2024  10:41 PM EST To: Reche GORMAN Finder, NP

## 2024-10-26 NOTE — Telephone Encounter (Signed)
 The patient has been notified of the result and verbalized understanding.  All questions (if any) were answered.  Pt aware to increase her KDUR to 20 mEq by mouth BID and come in for repeat BMET in one week.    Pt was advised to stay well hydrated for protection of her kidney function.   Confirmed the pharmacy of choice with the pt.  Pt verbalized understanding and agrees with this plan.

## 2024-10-29 NOTE — Progress Notes (Unsigned)
 New Hematology/Oncology Consult   Requesting MD: Gaines Ada, FNP  [phone]      Reason for Consult: Abnormal SPEP  HPI: Katie Potts was referred for evaluation of an abnormal SPEP.  Multiple myeloma panel was completed 09/13/2024.  IgA returned mildly elevated at 442 (64-422), IgG and IgM normal range; no M spike; immunofixation with polyclonal increase in 1 or more immunoglobulins.  Chemistry panel also completed 09/12/2024 with creatinine 1.39, calcium  10.6, potassium 3.1.     Past Medical History:  Diagnosis Date   Asthma    Chest pain of uncertain etiology 03/16/2021   Depression    MAJOR DEPRESSION, DR. KAUR;PSYCHIATRIST   HTN (hypertension) 07/06/2019   Hyperlipidemia    Hypertensive heart disease with chronic diastolic congestive heart failure (HCC) 07/06/2019   Hypokalemia 03/16/2021   Shortness of breath 09/16/2024   Snoring 03/16/2021     Past Surgical History:  Procedure Laterality Date   BREAST BIOPSY     BREAST EXCISIONAL BIOPSY     BREAST SURGERY     REDUCTION MAMMAPLASTY Bilateral 1973     Current Outpatient Medications:    acetaminophen  (TYLENOL ) 500 MG tablet, Take 500 mg by mouth every 6 (six) hours as needed for moderate pain (pain score 4-6). Patient is taking 1-2 tablets as needed for mild to moderate back pain and hand pain., Disp: , Rfl:    albuterol  (VENTOLIN  HFA) 108 (90 Base) MCG/ACT inhaler, Inhale 2 puffs into the lungs every 6 (six) hours as needed for wheezing or shortness of breath., Disp: 18 g, Rfl: 3   aspirin EC 81 MG tablet, Take 81 mg by mouth daily., Disp: , Rfl:    Cholecalciferol (VITAMIN D3) 125 MCG (5000 UT) CAPS, Take 5,000 Units by mouth daily., Disp: , Rfl:    clonazePAM (KLONOPIN) 1 MG tablet, Take 1 mg by mouth 2 (two) times daily. 1/2 tablet by mouth up to twice daily as needed for anxiety or panic attacks., Disp: , Rfl:    diphenhydrAMINE  HCl (BENADRYL  ALLERGY PO), Take 2 tablets by mouth as needed., Disp: , Rfl:     Evolocumab  (REPATHA  SURECLICK) 140 MG/ML SOAJ, Inject 140 mg into the skin every 14 (fourteen) days., Disp: 6 mL, Rfl: 3   FLUoxetine (PROZAC) 20 MG capsule, Take 40 mg by mouth every morning., Disp: , Rfl:    hydrALAZINE  (APRESOLINE ) 50 MG tablet, Take 1 tablet (50 mg total) by mouth 2 (two) times daily., Disp: 180 tablet, Rfl: 1   hydrochlorothiazide  (HYDRODIURIL ) 12.5 MG tablet, TAKE 1 TABLET(12.5 MG) BY MOUTH DAILY, Disp: 90 tablet, Rfl: 1   NEXLETOL  180 MG TABS, TAKE 1 TABLET(180 MG) BY MOUTH DAILY, Disp: 30 tablet, Rfl: 11   NIFEdipine  (PROCARDIA -XL/NIFEDICAL-XL) 30 MG 24 hr tablet, TAKE 1 TABLET(30 MG) BY MOUTH DAILY, Disp: 30 tablet, Rfl: 10   potassium chloride SA (KLOR-CON M) 20 MEQ tablet, Take 1 tablet (20 mEq total) by mouth 2 (two) times daily., Disp: 60 tablet, Rfl: 0   semaglutide -weight management (WEGOVY ) 1 MG/0.5ML SOAJ SQ injection, ADMINISTER 1 MG UNDER THE SKIN 1 TIME A WEEK, Disp: 2 mL, Rfl: 11   zolpidem (AMBIEN) 10 MG tablet, Take 10 mg by mouth at bedtime as needed for sleep., Disp: , Rfl: :     Allergies  Allergen Reactions   Amlodipine  Swelling and Other (See Comments)   Coreg  [Carvedilol ]     MADE HER FEEL LIKE SHE WAS GOING TO FAINT    Losartan      myalgias  Pravastatin     myalgias   Rosuvastatin      myalgias   Seroquel [Quetiapine Fumarate] Swelling   Spironolactone      Abnormal kidney functions     FH:  SOCIAL HISTORY:  Review of Systems:  Physical Exam:  There were no vitals taken for this visit.  HEENT: *** Lungs: *** Cardiac: *** Abdomen: *** GU: ***  Vascular: *** Lymph nodes: *** Neurologic: *** Skin: *** Musculoskeletal: ***  LABS:  No results for input(s): WBC, HGB, HCT, PLT in the last 72 hours.  No results for input(s): NA, K, CL, CO2, GLUCOSE, BUN, CREATININE, CALCIUM  in the last 72 hours.    RADIOLOGY:  MR BREAST BILATERAL W WO CONTRAST INC CAD Result Date: 10/17/2024 CLINICAL DATA:   73 year old female with longstanding diffuse bilateral breast pain. Supplemental imaging. Family history of breast cancer in a cousin. History of reduction mammoplasty. EXAM: BILATERAL BREAST MRI WITH AND WITHOUT CONTRAST TECHNIQUE: Multiplanar, multisequence MR images of both breasts were obtained prior to and following the intravenous administration of 6 ml of Vueway Three-dimensional MR images were rendered by post-processing of the original MR data on an independent workstation. The three-dimensional MR images were interpreted, and findings are reported in the following complete MRI report for this study. Three dimensional images were evaluated at the independent interpreting workstation using the DynaCAD thin client. COMPARISON:  No prior MRIs.  Comparison made to prior mammograms. FINDINGS: Breast composition: b. Scattered fibroglandular tissue. Background parenchymal enhancement: Minimal Right breast: No mass or abnormal enhancement. Left breast: No mass or abnormal enhancement. Lymph nodes: No abnormal appearing lymph nodes. Ancillary findings:  None. IMPRESSION: No MRI evidence of malignancy in either breast. RECOMMENDATION: 1. Continued annual screening mammography is recommended. The patient was due for her next mammogram in July of 2026. 2. The American Cancer Society recommends annual MRI and mammography in patients with an estimated lifetime risk of developing breast cancer greater than 20-25%, or who are known or suspected to be positive for the breast cancer gene. BI-RADS CATEGORY  1: Negative. Electronically Signed   By: Dina  Arceo M.D.   On: 10/17/2024 11:38   ECHOCARDIOGRAM COMPLETE Result Date: 10/12/2024    ECHOCARDIOGRAM REPORT   Patient Name:   Katie Potts Date of Exam: 10/12/2024 Medical Rec #:  [id number]      Height:       61.0 in Accession #:    [id number]     Weight:       136.0 lb Date of Birth:  September 19, 1951       BSA:          1.603 m Patient Age:    73 years       BP:            128/70 mmHg Patient Gender: F              HR:           82 bpm. Exam Location:  Outpatient Procedure: 2D Echo, 3D Echo, Cardiac Doppler, Color Doppler and Strain Analysis            (Both Spectral and Color Flow Doppler were utilized during            procedure). Indications:    Dyspnea on exertion  History:        Patient has prior history of Echocardiogram examinations.                 Signs/Symptoms:Shortness of Breath; Risk Factors:Non-Smoker,  Hypertension and Dyslipidemia.  Sonographer:    Orvil Holmes RDCS Referring Phys: [id number] TIFFANY [place] IMPRESSIONS  1. Left ventricular ejection fraction, by estimation, is 60 to 65%. Left ventricular ejection fraction by 3D volume is 65 %. The left ventricle has normal function. The left ventricle has no regional wall motion abnormalities. Left ventricular diastolic  parameters are consistent with Grade I diastolic dysfunction (impaired relaxation). The average left ventricular global longitudinal strain is -16.8 %. The global longitudinal strain is abnormal.  2. Right ventricular systolic function is normal. The right ventricular size is normal. There is normal pulmonary artery systolic pressure.  3. Thickening of the aterior mitral valve leaflet measuring 0.9 x 0.7 cm. Mitral valve leaflets were noted to be thickend on echo in 2018 as well. Unable to view images. The mitral valve is degenerative. No evidence of mitral valve regurgitation. No evidence of mitral stenosis.  4. The aortic valve is tricuspid. Aortic valve regurgitation is not visualized. No aortic stenosis is present.  5. The inferior vena cava is normal in size with greater than 50% respiratory variability, suggesting right atrial pressure of 3 mmHg. FINDINGS  Left Ventricle: Left ventricular ejection fraction, by estimation, is 60 to 65%. Left ventricular ejection fraction by 3D volume is 65 %. The left ventricle has normal function. The left ventricle has no regional wall motion  abnormalities. The average left ventricular global longitudinal strain is -16.8 %. Strain was performed and the global longitudinal strain is abnormal. The left ventricular internal cavity size was normal in size. There is no left ventricular hypertrophy. Left ventricular diastolic parameters are consistent with Grade I diastolic dysfunction (impaired relaxation). Normal left ventricular filling pressure. Right Ventricle: The right ventricular size is normal. No increase in right ventricular wall thickness. Right ventricular systolic function is normal. There is normal pulmonary artery systolic pressure. The tricuspid regurgitant velocity is 2.19 m/s, and  with an assumed right atrial pressure of 3 mmHg, the estimated right ventricular systolic pressure is 22.2 mmHg. Left Atrium: Left atrial size was normal in size. Right Atrium: Right atrial size was normal in size. Pericardium: There is no evidence of pericardial effusion. Mitral Valve: Thickening of the aterior mitral valve leaflet measuring 0.9 x 0.7 cm. Mitral valve leaflets were noted to be thickend on echo in 2018 as well. Unable to view images. The mitral valve is degenerative in appearance. There is moderate thickening of the mitral valve leaflet(s). There is moderate calcification of the mitral valve leaflet(s). No evidence of mitral valve regurgitation. No evidence of mitral valve stenosis. Tricuspid Valve: The tricuspid valve is normal in structure. Tricuspid valve regurgitation is trivial. No evidence of tricuspid stenosis. Aortic Valve: The aortic valve is tricuspid. Aortic valve regurgitation is not visualized. No aortic stenosis is present. Pulmonic Valve: The pulmonic valve was normal in structure. Pulmonic valve regurgitation is not visualized. No evidence of pulmonic stenosis. Aorta: The aortic root is normal in size and structure. Venous: The inferior vena cava is normal in size with greater than 50% respiratory variability, suggesting right  atrial pressure of 3 mmHg. IAS/Shunts: No atrial level shunt detected by color flow Doppler. Additional Comments: 3D was performed not requiring image post processing on an independent workstation and was normal.  LEFT VENTRICLE PLAX 2D LVIDd:         3.72 cm         Diastology LVIDs:         1.91 cm         LV  e' medial:    5.33 cm/s LV PW:         0.93 cm         LV E/e' medial:  7.9 LV IVS:        0.63 cm         LV e' lateral:   8.05 cm/s LVOT diam:     2.00 cm         LV E/e' lateral: 5.2 LV SV:         53 LV SV Index:   33              2D Longitudinal LVOT Area:     3.14 cm        Strain                                2D Strain GLS   -17.3 %                                (A4C):                                2D Strain GLS   -13.2 %                                (A3C):                                2D Strain GLS   -20.0 %                                (A2C):                                2D Strain GLS   -16.8 %                                Avg:                                 3D Volume EF                                LV 3D EF:    Left                                             ventricul                                             ar  ejection                                             fraction                                             by 3D                                             volume is                                             65 %.                                 3D Volume EF:                                3D EF:        65 %                                LV EDV:       91 ml                                LV ESV:       32 ml                                LV SV:        60 ml RIGHT VENTRICLE RV Basal diam:  3.31 cm    PULMONARY VEINS RV Mid diam:    2.54 cm    A Reversal Velocity: 29.90 cm/s RV S prime:     9.79 cm/s  Diastolic Velocity:  37.20 cm/s TAPSE (M-mode): 1.4 cm     S/D Velocity:        1.70                            Systolic  Velocity:   61.80 cm/s LEFT ATRIUM             Index        RIGHT ATRIUM           Index LA diam:        2.70 cm 1.68 cm/m   RA Area:     11.60 cm LA Vol (A2C):   33.0 ml 20.58 ml/m  RA Volume:   25.60 ml  15.97 ml/m LA Vol (A4C):   29.5 ml 18.40 ml/m LA Biplane Vol: 32.4 ml 20.21 ml/m  AORTIC VALVE LVOT Vmax:   94.30 cm/s LVOT Vmean:  60.300 cm/s LVOT VTI:    0.169 m  AORTA Ao Root diam:  2.60 cm Ao Asc diam:  3.10 cm MITRAL VALVE               TRICUSPID VALVE MV Area (PHT): 4.68 cm    TR Peak grad:   19.2 mmHg MV Decel Time: 162 msec    TR Vmax:        219.00 cm/s MV E velocity: 41.90 cm/s MV A velocity: 79.70 cm/s  SHUNTS MV E/A ratio:  0.53        Systemic VTI:  0.17 m                            Systemic Diam: 2.00 cm [name] MD Electronically signed by [name] MD Signature Date/Time: 10/12/2024/1:00:42 PM    Final     Assessment and Plan:   ***    Olam Ned, NP 10/29/2024, 5:00 PM

## 2024-10-30 ENCOUNTER — Inpatient Hospital Stay

## 2024-10-30 ENCOUNTER — Encounter: Payer: Self-pay | Admitting: Nurse Practitioner

## 2024-10-30 ENCOUNTER — Encounter: Payer: Self-pay | Admitting: Nutrition

## 2024-10-30 ENCOUNTER — Inpatient Hospital Stay: Attending: Nurse Practitioner | Admitting: Nurse Practitioner

## 2024-10-30 VITALS — BP 128/69 | HR 85 | Temp 97.9°F | Resp 18 | Ht 61.0 in | Wt 132.1 lb

## 2024-10-30 DIAGNOSIS — N189 Chronic kidney disease, unspecified: Secondary | ICD-10-CM

## 2024-10-30 DIAGNOSIS — Z8042 Family history of malignant neoplasm of prostate: Secondary | ICD-10-CM

## 2024-10-30 DIAGNOSIS — I2510 Atherosclerotic heart disease of native coronary artery without angina pectoris: Secondary | ICD-10-CM

## 2024-10-30 DIAGNOSIS — Z8261 Family history of arthritis: Secondary | ICD-10-CM

## 2024-10-30 DIAGNOSIS — R7989 Other specified abnormal findings of blood chemistry: Secondary | ICD-10-CM

## 2024-10-30 DIAGNOSIS — R778 Other specified abnormalities of plasma proteins: Secondary | ICD-10-CM

## 2024-10-30 DIAGNOSIS — Z800 Family history of malignant neoplasm of digestive organs: Secondary | ICD-10-CM | POA: Diagnosis not present

## 2024-10-30 DIAGNOSIS — I10 Essential (primary) hypertension: Secondary | ICD-10-CM

## 2024-10-30 DIAGNOSIS — E785 Hyperlipidemia, unspecified: Secondary | ICD-10-CM

## 2024-10-30 LAB — CMP (CANCER CENTER ONLY)
ALT: 13 U/L (ref 0–44)
AST: 21 U/L (ref 15–41)
Albumin: 4.9 g/dL (ref 3.5–5.0)
Alkaline Phosphatase: 57 U/L (ref 38–126)
Anion gap: 13 (ref 5–15)
BUN: 20 mg/dL (ref 8–23)
CO2: 28 mmol/L (ref 22–32)
Calcium: 10.7 mg/dL — ABNORMAL HIGH (ref 8.9–10.3)
Chloride: 100 mmol/L (ref 98–111)
Creatinine: 1.38 mg/dL — ABNORMAL HIGH (ref 0.44–1.00)
GFR, Estimated: 41 mL/min — ABNORMAL LOW (ref >60)
Glucose, Bld: 84 mg/dL (ref 70–99)
Potassium: 3.1 mmol/L — ABNORMAL LOW (ref 3.5–5.1)
Sodium: 141 mmol/L (ref 135–145)
Total Bilirubin: 0.7 mg/dL (ref 0.0–1.2)
Total Protein: 8.1 g/dL (ref 6.5–8.1)

## 2024-10-30 LAB — CBC WITH DIFFERENTIAL (CANCER CENTER ONLY)
Abs Immature Granulocytes: 0.02 K/uL (ref 0.00–0.07)
Basophils Absolute: 0.1 K/uL (ref 0.0–0.1)
Basophils Relative: 2 %
Eosinophils Absolute: 0.1 K/uL (ref 0.0–0.5)
Eosinophils Relative: 2 %
HCT: 37.1 % (ref 36.0–46.0)
Hemoglobin: 12.7 g/dL (ref 12.0–15.0)
Immature Granulocytes: 1 %
Lymphocytes Relative: 36 %
Lymphs Abs: 1.4 K/uL (ref 0.7–4.0)
MCH: 29.6 pg (ref 26.0–34.0)
MCHC: 34.2 g/dL (ref 30.0–36.0)
MCV: 86.5 fL (ref 80.0–100.0)
Monocytes Absolute: 0.3 K/uL (ref 0.1–1.0)
Monocytes Relative: 9 %
Neutro Abs: 2 K/uL (ref 1.7–7.7)
Neutrophils Relative %: 50 %
Platelet Count: 293 K/uL (ref 150–400)
RBC: 4.29 MIL/uL (ref 3.87–5.11)
RDW: 13.6 % (ref 11.5–15.5)
WBC Count: 3.9 K/uL — ABNORMAL LOW (ref 4.0–10.5)
nRBC: 0 % (ref 0.0–0.2)

## 2024-10-30 LAB — LACTATE DEHYDROGENASE: LDH: 165 U/L (ref 105–235)

## 2024-10-31 ENCOUNTER — Other Ambulatory Visit: Payer: Self-pay | Admitting: Nurse Practitioner

## 2024-10-31 DIAGNOSIS — R778 Other specified abnormalities of plasma proteins: Secondary | ICD-10-CM

## 2024-10-31 LAB — RHEUMATOID FACTOR: Rheumatoid fact SerPl-aCnc: 11.2 [IU]/mL (ref <14.0)

## 2024-11-01 ENCOUNTER — Telehealth: Payer: Self-pay

## 2024-11-01 NOTE — Telephone Encounter (Signed)
-----   Message from Katie Potts sent at 10/31/2024  3:28 PM EST ----- Please let her know we would like for her to come in for a repeat chemistry panel in 2 weeks and complete a 24-hour urine.  She could pick up the specimen container for the urine this week and plan to collect/submit when she comes in for blood work in 2 weeks.

## 2024-11-01 NOTE — Telephone Encounter (Signed)
 Patient gave verbal understanding and had no further questions.

## 2024-11-02 ENCOUNTER — Telehealth: Payer: Self-pay | Admitting: Oncology

## 2024-11-02 ENCOUNTER — Telehealth: Payer: Self-pay | Admitting: *Deleted

## 2024-11-02 NOTE — Telephone Encounter (Signed)
 PT called with questions for provider.

## 2024-11-02 NOTE — Telephone Encounter (Signed)
 Ms. Frese called to request another appointment with Dr. Cloretta. She admits that at her new patient visit she was presented with so many things that she shut down. Has many questions and she feel she has been having many of the symptoms that one with myeloma would have. She is bringing in her urine and having lab draw on 11/24. Informed her that all her labs have not yet resulted. RN will f/u.

## 2024-11-05 ENCOUNTER — Inpatient Hospital Stay

## 2024-11-06 ENCOUNTER — Telehealth: Payer: Self-pay | Admitting: *Deleted

## 2024-11-06 NOTE — Telephone Encounter (Signed)
 Notified patient that there was processing error at [facility] and the light chains and myeloma panel has not been run due to coding issue on their side. This RN will continue to f/u on this and review w/MD when results are available.

## 2024-11-07 ENCOUNTER — Other Ambulatory Visit: Payer: Self-pay | Admitting: Licensed Clinical Social Worker

## 2024-11-08 ENCOUNTER — Telehealth: Payer: Self-pay | Admitting: Family

## 2024-11-08 MED ORDER — POTASSIUM CHLORIDE CRYS ER 10 MEQ PO TBCR: 10.0000 meq | EXTENDED_RELEASE_TABLET | Freq: Two times a day (BID) | ORAL | 3 refills | Status: AC

## 2024-11-08 NOTE — Telephone Encounter (Signed)
 Spoke with patient who stated she started taking the K+ around 11/12 After she took the pill she noticed she was gassy and just didn't feel well.  She kept taking and his am did vomit  She took medication with food   Will forward to Caitlin W NP for review

## 2024-11-08 NOTE — Telephone Encounter (Signed)
 Advised patient, verbalized understanding

## 2024-11-08 NOTE — Telephone Encounter (Signed)
 Pt c/o medication issue:  1. Name of Medication:   potassium chloride  SA (KLOR-CON  M) 20 MEQ tablet    2. How are you currently taking this medication (dosage and times per day)? As written  3. Are you having a reaction (difficulty breathing--STAT)? No   4. What is your medication issue? Pt states medication is making her gassy and she vomited this morning please advise

## 2024-11-08 NOTE — Telephone Encounter (Signed)
 There are limited options given her persistently low potassium as the packets were cost prohibitive. If she would like to try lower dose K 10mEq BID with food that is fine. Recommend potassium rich foods (bananas, cantaloupe, green leafy vegetables, tomatoes). She has repeat labs with oncology team 11/24. Also recommend stop hydrochlorothiazide  as likely further lowering her K.  Katie Potts S Emree Locicero, NP

## 2024-11-12 ENCOUNTER — Inpatient Hospital Stay

## 2024-11-12 ENCOUNTER — Encounter: Payer: Self-pay | Admitting: *Deleted

## 2024-11-12 DIAGNOSIS — R778 Other specified abnormalities of plasma proteins: Secondary | ICD-10-CM

## 2024-11-12 LAB — CMP (CANCER CENTER ONLY)
ALT: 10 U/L (ref 0–44)
AST: 17 U/L (ref 15–41)
Albumin: 4.3 g/dL (ref 3.5–5.0)
Alkaline Phosphatase: 53 U/L (ref 38–126)
Anion gap: 11 (ref 5–15)
BUN: 18 mg/dL (ref 8–23)
CO2: 27 mmol/L (ref 22–32)
Calcium: 10.3 mg/dL (ref 8.9–10.3)
Chloride: 103 mmol/L (ref 98–111)
Creatinine: 1.06 mg/dL — ABNORMAL HIGH (ref 0.44–1.00)
GFR, Estimated: 56 mL/min — ABNORMAL LOW (ref >60)
Glucose, Bld: 83 mg/dL (ref 70–99)
Potassium: 3.3 mmol/L — ABNORMAL LOW (ref 3.5–5.1)
Sodium: 141 mmol/L (ref 135–145)
Total Bilirubin: 0.7 mg/dL (ref 0.0–1.2)
Total Protein: 7.3 g/dL (ref 6.5–8.1)

## 2024-11-12 LAB — KAPPA/LAMBDA LIGHT CHAINS

## 2024-11-12 NOTE — Progress Notes (Signed)
 Per lab supervisor: light chains are ready, but did not cross over to [facility]. She will scan results today. The myeloma panel at [facility] read error so additional testing is needed for this to result. Could take 2 days to 1 week.

## 2024-11-13 ENCOUNTER — Other Ambulatory Visit: Payer: Self-pay | Admitting: Licensed Clinical Social Worker

## 2024-11-13 ENCOUNTER — Other Ambulatory Visit: Payer: Self-pay

## 2024-11-13 LAB — MULTIPLE MYELOMA PANEL, SERUM
Albumin SerPl Elph-Mcnc: 3.9 g/dL (ref 2.9–4.4)
Albumin/Glob SerPl: 1.1 (ref 0.7–1.7)
Alpha 1: 0.3 g/dL (ref 0.0–0.4)
Alpha2 Glob SerPl Elph-Mcnc: 0.9 g/dL (ref 0.4–1.0)
B-Globulin SerPl Elph-Mcnc: 1.5 g/dL — ABNORMAL HIGH (ref 0.7–1.3)
Gamma Glob SerPl Elph-Mcnc: 1 g/dL (ref 0.4–1.8)
Globulin, Total: 3.7 g/dL (ref 2.2–3.9)
IgA: 463 mg/dL — ABNORMAL HIGH (ref 64–422)
IgG (Immunoglobin G), Serum: 1229 mg/dL (ref 586–1602)
IgM (Immunoglobulin M), Srm: 40 mg/dL (ref 26–217)
Total Protein ELP: 7.6 g/dL (ref 6.0–8.5)

## 2024-11-13 NOTE — Patient Instructions (Signed)
 Visit Information  Thank you for taking time to visit with me today. Please don't hesitate to contact me if I can be of assistance to you before our next scheduled appointment.  Our next appointment is by telephone on 11/27/2024 at 9:30 am Please call the care guide team at [phone] if you need to cancel or reschedule your appointment.   Following is a copy of your care plan:   Goals Addressed             This Visit's Progress    BSW VBCI Social Work Care Plan       Current SDOH Barriers:  Financial constraints related to living on social security  Limited access to food Housing barriers needing a RVSP parking at her her apartment completx  Interventions: Provided patient with information about Legal Aide and food pantry Discussed plans with patient for ongoing follow up and provided patient with direct contact number Advised patient to call legal aide and to review the food pantry list SW will mail out the food pantry list and [employer] resources list          Please call the Suicide and Crisis Lifeline: 988 go to [facility] [address], [place] [phone]) call 911 if you are experiencing a Mental Health or Behavioral Health Crisis or need someone to talk to.  Patient verbalized understanding of Care plan and visit instructions communicated this visit  [name] CHARM [name], PhD [facility], [facility] Social Worker Direct Dial: [phone]  Fax: [phone]

## 2024-11-13 NOTE — Patient Outreach (Signed)
 Complex Care Management   Visit Note  11/13/2024  Name:  Katie Potts MRN: [id number] DOB: [birth year]  Situation: Referral received for Complex Care Management related to Heart Failure and Palpitations, Constipation, Class 1 Obesity, Sciatica pain, Asthma, stage 2 CKD, Essential Hypertension, bilateral breast pain, chronic back pain, bilateral hand pain, Grief, Stress related to condition of a family member. I obtained verbal consent from Patient.  Visit completed with Patient on the phone.  Background:   Past Medical History:  Diagnosis Date   Asthma    Chest pain of uncertain etiology 03/16/2021   Depression    MAJOR DEPRESSION, DR. KAUR;PSYCHIATRIST   HTN (hypertension) 07/06/2019   Hyperlipidemia    Hypertensive heart disease with chronic diastolic congestive heart failure (HCC) 07/06/2019   Hypokalemia 03/16/2021   Shortness of breath 09/16/2024   Snoring 03/16/2021    Assessment: Patient Reported Symptoms:  Cognitive Cognitive Status: Able to follow simple commands, Normal speech and language skills Cognitive/Intellectual Conditions Management [RPT]: None reported or documented in medical history or problem list   Health Maintenance Behaviors: Annual physical exam, Healthy diet, Stress management Health Facilitated by: Healthy diet, Rest, Stress management  Neurological Neurological Review of Symptoms: No symptoms reported    HEENT HEENT Symptoms Reported: No symptoms reported      Cardiovascular Cardiovascular Symptoms Reported: No symptoms reported    Respiratory Respiratory Symptoms Reported: Shortness of breath Other Respiratory Symptoms: intermittent shortness of breath with excessive walking Respiratory Management Strategies: Adequate rest, Routine screening Respiratory Self-Management Outcome: 4 (good)  Endocrine Endocrine Symptoms Reported: No symptoms reported Is patient diabetic?: No    Gastrointestinal Gastrointestinal Symptoms Reported:  Diarrhea Gastrointestinal Management Strategies: Medication therapy, Diet modification Gastrointestinal Self-Management Outcome: 3 (uncertain)    Genitourinary Genitourinary Symptoms Reported: No symptoms reported    Integumentary Integumentary Symptoms Reported: No symptoms reported    Musculoskeletal Musculoskelatal Symptoms Reviewed: Limited mobility Musculoskeletal Management Strategies: Adequate rest, Routine screening Musculoskeletal Self-Management Outcome: 3 (uncertain) Falls in the past year?: No Number of falls in past year: 1 or less Was there an injury with Fall?: No Fall Risk Category Calculator: 0 Patient Fall Risk Level: Low Fall Risk Patient at Risk for Falls Due to: No Fall Risks Fall risk Follow up: Falls evaluation completed  Psychosocial  Psychosocial Symptoms Reported: Difficulty concentrating Behavioral Management Strategies: Coping strategies, Adequate rest Behavioral Health Self-Management Outcome: 3 (uncertain) Major Change/Loss/Stressor/Fears (CP): Medical condition, self, Medical condition, family Techniques to [place] with Loss/Stress/Change: Diversional activities Quality of Family Relationships: helpful, supportive, involved Do you feel physically threatened by others?: No Established with Jasmine Lewis LCSW for counseling and support       11/13/2024    PHQ2-9 Depression Screening   Tura Roller interest or pleasure in doing things    Feeling down, depressed, or hopeless    PHQ-2 - Total Score    Trouble falling or staying asleep, or sleeping too much    Feeling tired or having Skylie Hiott energy    Poor appetite or overeating     Feeling bad about yourself - or that you are a failure or have let yourself or your family down    Trouble concentrating on things, such as reading the newspaper or watching television    Moving or speaking so slowly that other people could have noticed.  Or the opposite - being so fidgety or restless that you have been moving around  a lot more than usual    Thoughts that you would be better off dead, or hurting  yourself in some way    PHQ2-9 Total Score    If you checked off any problems, how difficult have these problems made it for you to do your work, take care of things at home, or get along with other people    Depression Interventions/Treatment      There were no vitals filed for this visit. Pain Scale: Not given for pain  Medications Reviewed Today     Reviewed by Morgan Clayborne CROME, RN (Registered Nurse) on 11/13/24 at 1109  Med List Status: <None>   Medication Order Taking? Sig Documenting Provider Last Dose Status Informant  acetaminophen  (TYLENOL ) 500 MG tablet [id number]  Take 500 mg by mouth every 6 (six) hours as needed for moderate pain (pain score 4-6). Patient is taking 1-2 tablets as needed for mild to moderate back pain and hand pain. [provider]  Active   albuterol  (VENTOLIN  HFA) 108 (90 Base) MCG/ACT inhaler [id number]  Inhale 2 puffs into the lungs every 6 (six) hours as needed for wheezing or shortness of breath. Georgina Speaks, FNP  Active   aspirin EC 81 MG tablet [id number]  Take 81 mg by mouth daily. [provider]  Active   Cholecalciferol (VITAMIN D3) 125 MCG (5000 UT) CAPS [id number]  Take 5,000 Units by mouth daily. [provider]  Active   clonazePAM (KLONOPIN) 1 MG tablet [id number]  Take 1 mg by mouth 2 (two) times daily. 1/2 tablet by mouth up to twice daily as needed for anxiety or panic attacks. [provider]  Active   diphenhydrAMINE  HCl (BENADRYL  ALLERGY PO) [id number]  Take 2 tablets by mouth as needed. [provider]  Active   Evolocumab  (REPATHA  SURECLICK) 140 MG/ML SOAJ [id number]  Inject 140 mg into the skin every 14 (fourteen) days. Raford Riggs, MD  Active   FLUoxetine (PROZAC) 20 MG capsule [id number]  Take 40 mg by mouth every morning. [provider]  Active   hydrALAZINE  (APRESOLINE ) 50 MG tablet [id number]  Take 1  tablet (50 mg total) by mouth 2 (two) times daily. Raford Riggs, MD  Expired 10/30/24 2359   hydrochlorothiazide  (HYDRODIURIL ) 12.5 MG tablet [id number]  TAKE 1 TABLET(12.5 MG) BY MOUTH DAILY Georgina Speaks, FNP  Active   NEXLETOL  180 MG TABS [id number]  TAKE 1 TABLET(180 MG) BY MOUTH DAILY Raford Riggs, MD  Active   NIFEdipine  (PROCARDIA -XL/NIFEDICAL-XL) 30 MG 24 hr tablet [id number]  TAKE 1 TABLET(30 MG) BY MOUTH DAILY Raford Riggs, MD  Active   potassium chloride  (KLOR-CON  M) 10 MEQ tablet [id number]  Take 1 tablet (10 mEq total) by mouth 2 (two) times daily. Vannie Reche RAMAN, NP  Active   semaglutide -weight management (WEGOVY ) 1 MG/0.5ML SOAJ SQ injection [id number]  ADMINISTER 1 MG UNDER THE SKIN 1 TIME A WEEK Raford Riggs, MD  Active   zolpidem (AMBIEN) 10 MG tablet [id number]  Take 10 mg by mouth at bedtime as needed for sleep. [provider]  Active             Recommendation:   Continue Current Plan of Care  Follow Up Plan:   Telephone follow up appointment date/time:    11/21/2024 Status: Sch   Time: 1:00 PM Length: 30  Visit Type: VBCI TELEPHONE CALL 30 [2502] Copay: $0.00  Provider: Ezzard Rolin BIRCH, LCSW Department: [facility]    11/27/2024 Status: Sch   Time: 9:30 AM Length: 30  Visit Type: PATIENT OUTREACH 30 [3016] Copay: $0.00  Provider: Maranda Lister D Department: [facility]   12/05/2024 Status: Sch   Time: 11:30 AM Length: 30  Visit Type: VBCI TELEPHONE CALL 30 [2502] Copay: $0.00  Provider: Morgan Clayborne CROME, RN Department: [facility]   Clayborne Morgan RN BSN CCM [place]  [facility], [facility] Nurse Care Coordinator  Direct Dial: [phone] Website: [email or web address]

## 2024-11-13 NOTE — Patient Instructions (Signed)
 Visit Information  Thank you for taking time to visit with me today. Please don't hesitate to contact me if I can be of assistance to you before our next scheduled appointment.  Your next care management appointment is by telephone on Wednesday, December 17 at 11:30 AM  Please call the care guide team at [phone] if you need to cancel, schedule, or reschedule an appointment.   Please call the Suicide and Crisis Lifeline: 988 if you are experiencing a Mental Health or Behavioral Health Crisis or need someone to talk to.  Clayborne Ly RN BSN CCM [place]  [facility], [facility] Nurse Care Coordinator  Direct Dial: [phone] Website: [email or web address]

## 2024-11-13 NOTE — Patient Outreach (Signed)
 Complex Care Management   Visit Note  11/13/2024  Name:  Katie Potts MRN: [id number] DOB: 1951-03-27  Situation: Referral received for Complex Care Management related to SDOH Barriers:  Food insecurity Financial Resource Strain and legal aid I obtained verbal consent from Patient.  Visit completed with Patient  on the phone  Background:   Past Medical History:  Diagnosis Date   Asthma    Chest pain of uncertain etiology 03/16/2021   Depression    MAJOR DEPRESSION, DR. KAUR;PSYCHIATRIST   HTN (hypertension) 07/06/2019   Hyperlipidemia    Hypertensive heart disease with chronic diastolic congestive heart failure (HCC) 07/06/2019   Hypokalemia 03/16/2021   Shortness of breath 09/16/2024   Snoring 03/16/2021    Assessment: Patient Reported Symptoms:  Cognitive        Neurological      HEENT        Cardiovascular      Respiratory      Endocrine      Gastrointestinal        Genitourinary      Integumentary      Musculoskeletal          Psychosocial            11/13/2024    PHQ2-9 Depression Screening   Little interest or pleasure in doing things    Feeling down, depressed, or hopeless    PHQ-2 - Total Score    Trouble falling or staying asleep, or sleeping too much    Feeling tired or having little energy    Poor appetite or overeating     Feeling bad about yourself - or that you are a failure or have let yourself or your family down    Trouble concentrating on things, such as reading the newspaper or watching television    Moving or speaking so slowly that other people could have noticed.  Or the opposite - being so fidgety or restless that you have been moving around a lot more than usual    Thoughts that you would be better off dead, or hurting yourself in some way    PHQ2-9 Total Score    If you checked off any problems, how difficult have these problems made it for you to do your work, take care of things at home, or get along with other people     Depression Interventions/Treatment      There were no vitals filed for this visit.    Medications Reviewed Today   Medications were not reviewed in this encounter     Recommendation:   Patient to call legal aid in regards to the RSVP parking at her apartment complex  Follow Up Plan:   Telephone follow up appointment date/time:  11/27/2024 at 9:30 am  [name] CHARM [name], PhD [facility], [facility] Social Worker Direct Dial: [phone]  Fax: [phone]

## 2024-11-14 ENCOUNTER — Telehealth: Payer: Self-pay

## 2024-11-14 NOTE — Telephone Encounter (Signed)
-----   Message from Olam Ned sent at 11/12/2024  3:20 PM EST ----- Myeloma lab results? ----- Message ----- From: Marikay Garen RAMAN, LPN Sent: 88/75/7974   3:07 PM EST To: Olam MARLA Ned, NP  She waste the urine and had to get a new urine container. She is planing to come back on Wednesday ----- Message ----- From: Ned Olam MARLA, NP Sent: 11/12/2024   1:06 PM EST To: [facility] Clinical  Where are the myeloma results from 10/30/2024?  Says a myeloma panel and light chains are in process.  Did she turn in a 24-hour urine today?  Please let her know calcium  is normal today and creatinine is lower.

## 2024-11-19 ENCOUNTER — Inpatient Hospital Stay: Attending: Nurse Practitioner

## 2024-11-19 DIAGNOSIS — R7689 Other specified abnormal immunological findings in serum: Secondary | ICD-10-CM | POA: Diagnosis present

## 2024-11-19 DIAGNOSIS — R778 Other specified abnormalities of plasma proteins: Secondary | ICD-10-CM

## 2024-11-21 ENCOUNTER — Other Ambulatory Visit: Payer: Self-pay | Admitting: Licensed Clinical Social Worker

## 2024-11-21 NOTE — Patient Outreach (Signed)
 Complex Care Management   Visit Note  11/21/2024  Name:  Katie Potts MRN: [id number] DOB: 08/06/1951  Situation: Referral received for Complex Care Management related to Mental/Behavioral Health diagnosis Anxiety I obtained verbal consent from Patient.  Visit completed with Patient  on the phone  Background:   Past Medical History:  Diagnosis Date   Asthma    Chest pain of uncertain etiology 03/16/2021   Depression    MAJOR DEPRESSION, DR. KAUR;PSYCHIATRIST   HTN (hypertension) 07/06/2019   Hyperlipidemia    Hypertensive heart disease with chronic diastolic congestive heart failure (HCC) 07/06/2019   Hypokalemia 03/16/2021   Shortness of breath 09/16/2024   Snoring 03/16/2021    Assessment: Patient Reported Symptoms:  Cognitive Cognitive Status: No symptoms reported, Alert and oriented to person, place, and time, Normal speech and language skills Cognitive/Intellectual Conditions Management [RPT]: None reported or documented in medical history or problem list   Health Maintenance Behaviors: Annual physical exam  Neurological Neurological Review of Symptoms: Not assessed    HEENT HEENT Symptoms Reported: Not assessed      Cardiovascular Cardiovascular Symptoms Reported: Not assessed    Respiratory Respiratory Symptoms Reported: Not assesed    Endocrine Endocrine Symptoms Reported: Not assessed    Gastrointestinal Gastrointestinal Symptoms Reported: Not assessed      Genitourinary Genitourinary Symptoms Reported: Not assessed    Integumentary Integumentary Symptoms Reported: Not assessed    Musculoskeletal Musculoskelatal Symptoms Reviewed: Not assessed        Psychosocial Psychosocial Symptoms Reported: Anxiety - if selected complete GAD, Other Other Psychosocial Conditions: Stress Additional Psychological Details: Solution focused strategies discussed, in addition, to local resources to assist with SDOH needs Behavioral Management Strategies: Adequate rest,  Coping strategies, Community resources, Support system Major Change/Loss/Stressor/Fears (CP): Environment, Medical condition, self, Resources, Medical condition, family Techniques to [place] with Loss/Stress/Change: Diversional activities      11/21/2024    PHQ2-9 Depression Screening   Little interest or pleasure in doing things    Feeling down, depressed, or hopeless    PHQ-2 - Total Score    Trouble falling or staying asleep, or sleeping too much    Feeling tired or having little energy    Poor appetite or overeating     Feeling bad about yourself - or that you are a failure or have let yourself or your family down    Trouble concentrating on things, such as reading the newspaper or watching television    Moving or speaking so slowly that other people could have noticed.  Or the opposite - being so fidgety or restless that you have been moving around a lot more than usual    Thoughts that you would be better off dead, or hurting yourself in some way    PHQ2-9 Total Score    If you checked off any problems, how difficult have these problems made it for you to do your work, take care of things at home, or get along with other people    Depression Interventions/Treatment      There were no vitals filed for this visit.    Medications Reviewed Today     Reviewed by Ezzard Rolin BIRCH, LCSW (Social Worker) on 11/21/24 at 1200  Med List Status: <None>   Medication Order Taking? Sig Documenting Provider Last Dose Status Informant  acetaminophen  (TYLENOL ) 500 MG tablet [id number]  Take 500 mg by mouth every 6 (six) hours as needed for moderate pain (pain score 4-6). Patient is taking 1-2 tablets as needed  for mild to moderate back pain and hand pain. [provider]  Active   albuterol  (VENTOLIN  HFA) 108 (90 Base) MCG/ACT inhaler [id number]  Inhale 2 puffs into the lungs every 6 (six) hours as needed for wheezing or shortness of breath. Georgina Speaks, FNP  Active   aspirin EC 81 MG tablet  [id number]  Take 81 mg by mouth daily. [provider]  Active   Cholecalciferol (VITAMIN D3) 125 MCG (5000 UT) CAPS [id number]  Take 5,000 Units by mouth daily. [provider]  Active   clonazePAM (KLONOPIN) 1 MG tablet [id number]  Take 1 mg by mouth 2 (two) times daily. 1/2 tablet by mouth up to twice daily as needed for anxiety or panic attacks. [provider]  Active   diphenhydrAMINE  HCl (BENADRYL  ALLERGY PO) [id number]  Take 2 tablets by mouth as needed. [provider]  Active   Evolocumab  (REPATHA  SURECLICK) 140 MG/ML SOAJ [id number]  Inject 140 mg into the skin every 14 (fourteen) days. Raford Riggs, MD  Active   FLUoxetine (PROZAC) 20 MG capsule [id number]  Take 40 mg by mouth every morning. [provider]  Active   hydrALAZINE  (APRESOLINE ) 50 MG tablet [id number]  Take 1 tablet (50 mg total) by mouth 2 (two) times daily. Raford Riggs, MD  Expired 10/30/24 2359   hydrochlorothiazide  (HYDRODIURIL ) 12.5 MG tablet [id number]  TAKE 1 TABLET(12.5 MG) BY MOUTH DAILY Georgina Speaks, FNP  Active   NEXLETOL  180 MG TABS [id number]  TAKE 1 TABLET(180 MG) BY MOUTH DAILY Raford Riggs, MD  Active   NIFEdipine  (PROCARDIA -XL/NIFEDICAL-XL) 30 MG 24 hr tablet [id number]  TAKE 1 TABLET(30 MG) BY MOUTH DAILY Raford Riggs, MD  Active   potassium chloride  (KLOR-CON  M) 10 MEQ tablet [id number]  Take 1 tablet (10 mEq total) by mouth 2 (two) times daily. Vannie Reche RAMAN, NP  Active   semaglutide -weight management (WEGOVY ) 1 MG/0.5ML SOAJ SQ injection [id number]  ADMINISTER 1 MG UNDER THE SKIN 1 TIME A WEEK Raford Riggs, MD  Active   zolpidem (AMBIEN) 10 MG tablet [id number]  Take 10 mg by mouth at bedtime as needed for sleep. [provider]  Active             Recommendation:   Continue Current Plan of Care  Follow Up Plan:   Telephone follow-up in 1 month  Rolin Kerns, LCSW [facility]  [facility], [facility] Clinical Social Worker Direct Dial: [phone]  Fax: [phone] Website: [email or web address] 12:07 PM

## 2024-11-21 NOTE — Patient Instructions (Signed)
 Visit Information  Thank you for taking time to visit with me today. Please don't hesitate to contact me if I can be of assistance to you before our next scheduled appointment.  Your next care management appointment is by telephone on 12/28/24 at 1 PM  Please call the care guide team at [phone] if you need to cancel, schedule, or reschedule an appointment.   Please call the Suicide and Crisis Lifeline: 988 go to [facility] Urgent [facility] [address], [place] [phone]) call 911 if you are experiencing a Mental Health or Behavioral Health Crisis or need someone to talk to.  Rolin Kerns, LCSW [place]  [facility], [facility] Clinical Social Worker Direct Dial: [phone]  Fax: [phone] Website: [email or web address] 12:08 PM

## 2024-11-25 NOTE — Patient Outreach (Signed)
 Complex Care Management   Visit Note  11/07/2024  Name:  Katie Potts MRN: [id number] DOB: 1951/01/26  Situation: Referral received for Complex Care Management related to Mental/Behavioral Health diagnosis Stress I obtained verbal consent from Patient.  Visit completed with Patient  on the phone  Background:   Past Medical History:  Diagnosis Date   Asthma    Chest pain of uncertain etiology 03/16/2021   Depression    MAJOR DEPRESSION, DR. KAUR;PSYCHIATRIST   HTN (hypertension) 07/06/2019   Hyperlipidemia    Hypertensive heart disease with chronic diastolic congestive heart failure (HCC) 07/06/2019   Hypokalemia 03/16/2021   Shortness of breath 09/16/2024   Snoring 03/16/2021    Assessment: Patient Reported Symptoms:  Cognitive Cognitive Status: No symptoms reported, Alert and oriented to person, place, and time, Normal speech and language skills Cognitive/Intellectual Conditions Management [RPT]: None reported or documented in medical history or problem list   Health Maintenance Behaviors: Annual physical exam  Neurological Neurological Review of Symptoms: Not assessed    HEENT HEENT Symptoms Reported: No symptoms reported      Cardiovascular Cardiovascular Symptoms Reported: No symptoms reported    Respiratory Respiratory Symptoms Reported: Not assesed    Endocrine Endocrine Symptoms Reported: No symptoms reported    Gastrointestinal Gastrointestinal Symptoms Reported: Not assessed      Genitourinary Genitourinary Symptoms Reported: No symptoms reported    Integumentary Integumentary Symptoms Reported: No symptoms reported    Musculoskeletal Musculoskelatal Symptoms Reviewed: Limited mobility        Psychosocial Psychosocial Symptoms Reported: Other Other Psychosocial Conditions: Stress Behavioral Management Strategies: Coping strategies, Support system, Medication therapy Major Change/Loss/Stressor/Fears (CP): Medical condition, self, Medical condition,  family, Environment Techniques to [place] with Loss/Stress/Change: Diversional activities      11/25/2024    PHQ2-9 Depression Screening   Little interest or pleasure in doing things    Feeling down, depressed, or hopeless    PHQ-2 - Total Score    Trouble falling or staying asleep, or sleeping too much    Feeling tired or having little energy    Poor appetite or overeating     Feeling bad about yourself - or that you are a failure or have let yourself or your family down    Trouble concentrating on things, such as reading the newspaper or watching television    Moving or speaking so slowly that other people could have noticed.  Or the opposite - being so fidgety or restless that you have been moving around a lot more than usual    Thoughts that you would be better off dead, or hurting yourself in some way    PHQ2-9 Total Score    If you checked off any problems, how difficult have these problems made it for you to do your work, take care of things at home, or get along with other people    Depression Interventions/Treatment      There were no vitals filed for this visit.    Medications Reviewed Today   Medications were not reviewed in this encounter     Recommendation:   Continue Current Plan of Care  Follow Up Plan:   Telephone follow-up in 1 month  Rolin Kerns, LCSW [facility]  [facility], [facility] Clinical Social Worker Direct Dial: [phone]  Fax: [phone] Website: [email or web address] 5:20 PM

## 2024-11-25 NOTE — Patient Instructions (Signed)
 Visit Information  Thank you for taking time to visit with me today. Please don't hesitate to contact me if I can be of assistance to you before our next scheduled appointment.  Your next care management appointment is by telephone on 12/03 at 1 PM  Please call the care guide team at [phone] if you need to cancel, schedule, or reschedule an appointment.   Please call the Suicide and Crisis Lifeline: 988 go to [facility] Urgent [employer] [facility] [address], [place] [phone]) call 911 if you are experiencing a Mental Health or Behavioral Health Crisis or need someone to talk to.  Rolin Kerns, LCSW [place]  [facility] Clinical Social Worker Direct Dial: [phone]  Fax: [phone] Website: [email or web address] 5:21 PM

## 2024-11-26 ENCOUNTER — Other Ambulatory Visit: Payer: Self-pay | Admitting: Licensed Clinical Social Worker

## 2024-11-26 ENCOUNTER — Other Ambulatory Visit (HOSPITAL_BASED_OUTPATIENT_CLINIC_OR_DEPARTMENT_OTHER): Payer: Self-pay | Admitting: Cardiovascular Disease

## 2024-11-26 LAB — UIFE/LIGHT CHAINS/TP QN, 24-HR UR
FR KAPPA LT CH,24HR: 15.76 mg/(24.h)
FR LAMBDA LT CH,24HR: 4.49 mg/(24.h)
Free Kappa Lt Chains,Ur: 15.76 mg/L (ref 1.17–86.46)
Free Kappa/Lambda Ratio: 3.51 (ref 1.83–14.26)
Free Lambda Lt Chains,Ur: 4.49 mg/L (ref 0.27–15.21)
Total Protein, Urine-Ur/day: 107 mg/(24.h) (ref 30–150)
Total Protein, Urine: 10.7 mg/dL

## 2024-11-26 NOTE — Patient Instructions (Signed)

## 2024-11-26 NOTE — Patient Outreach (Signed)
 Social Drivers of Health  Community Resource and Care Coordination Visit Note   11/26/2024  Name: Katie Potts MRN: [id number] DOB:1951/03/16  Situation: Referral received for [facility] needs assessment and assistance related to Housing  Parking RSVP sign. I obtained verbal consent from Patient.  Visit completed with Patient on the phone.   Background:   SDOH Interventions Today    Flowsheet Row Most Recent Value  SDOH Interventions   Food Insecurity Interventions Intervention Not Indicated  Housing Interventions Intervention Not Indicated  Transportation Interventions Intervention Not Indicated  Utilities Interventions Intervention Not Indicated     Assessment:   Goals Addressed             This Visit's Progress    COMPLETED: BSW VBCI Social Work Care Plan       Current SDOH Barriers:  Financial constraints related to living on social security  Limited access to food Housing barriers needing a RVSP parking at her her apartment completx  Interventions: Provided patient with information about Legal Aide and food pantry Discussed plans with patient for ongoing follow up and provided patient with direct contact number Advised patient to call legal aide and to review the food pantry list SW will mail out the food pantry list and [place] resources list          Recommendation:   attend all scheduled provider appointments Follow up with the apartment complex about the increase in rent. closing this patient out today the apartment complex finally got her pole and sign in for her to park and they are scheduling someone in maintenance to install it. The only other thing she was not happy with it that her rent is going up by $75 for next year, but she is going to call legal aid to see if that is legal.   Follow Up Plan:   Patient has achieved all patient stated goals. [employer] will be closed. Patient has been provided contact information  should new needs arise.   [name] CHARM [name], PhD [facility], [facility] Social Worker Direct Dial: [phone]  Fax: [phone]

## 2024-11-27 ENCOUNTER — Other Ambulatory Visit: Admitting: Licensed Clinical Social Worker

## 2024-12-05 ENCOUNTER — Other Ambulatory Visit

## 2024-12-07 ENCOUNTER — Telehealth: Payer: Self-pay | Admitting: Oncology

## 2024-12-07 ENCOUNTER — Encounter: Payer: Self-pay | Admitting: *Deleted

## 2024-12-07 NOTE — Progress Notes (Unsigned)
 NP noted that patient had called in past with more questions regarding her lab work and to discuss some symptoms she thinks could be myeloma related. Will offer a 30 minute f/u with Dr. Cloretta to review all her labs and answer her questions. Do not see anything in her lab work to suggest she has myeloma. Scheduling message sent.

## 2024-12-07 NOTE — Telephone Encounter (Signed)
 Called PT to see if she would like to have a FU appt, PT declined.

## 2024-12-10 NOTE — Patient Outreach (Signed)
 Complex Care Management   Visit Note  12/05/2024  Name:  Katie Potts MRN: [id number] DOB: [birth year]  Situation: Referral received for Complex Care Management related to  Heart Failure and Palpitations, Constipation, Class 1 Obesity, Sciatica pain, Asthma, stage 2 CKD, Essential Hypertension, bilateral breast pain, chronic back pain, bilateral hand pain, Grief, Stress related to condition of a family member.  I obtained verbal consent from Patient.  Visit completed with Patient on the phone.  Background:   Past Medical History:  Diagnosis Date   Asthma    Chest pain of uncertain etiology 03/16/2021   Depression    MAJOR DEPRESSION, DR. KAUR;PSYCHIATRIST   HTN (hypertension) 07/06/2019   Hyperlipidemia    Hypertensive heart disease with chronic diastolic congestive heart failure (HCC) 07/06/2019   Hypokalemia 03/16/2021   Shortness of breath 09/16/2024   Snoring 03/16/2021    Assessment: Patient Reported Symptoms:  Cognitive Cognitive Status: Alert and oriented to person, place, and time, Normal speech and language skills Cognitive/Intellectual Conditions Management [RPT]: None reported or documented in medical history or problem list   Health Maintenance Behaviors: Annual physical exam Health Facilitated by: Rest  Neurological Neurological Review of Symptoms: No symptoms reported    HEENT HEENT Symptoms Reported: No symptoms reported      Cardiovascular Cardiovascular Symptoms Reported: No symptoms reported    Respiratory Respiratory Symptoms Reported: No symptoms reported    Endocrine Endocrine Symptoms Reported: No symptoms reported Is patient diabetic?: No    Gastrointestinal Gastrointestinal Symptoms Reported: No symptoms reported      Genitourinary Genitourinary Symptoms Reported: No symptoms reported    Integumentary Integumentary Symptoms Reported: No symptoms reported    Musculoskeletal Musculoskelatal Symptoms Reviewed: No symptoms reported         Psychosocial Psychosocial Symptoms Reported: No symptoms reported   Major Change/Loss/Stressor/Fears (CP): Death of a loved one, Medical condition, family Techniques to [place] with Loss/Stress/Change: Diversional activities Quality of Family Relationships: supportive Do you feel physically threatened by others?: No    12/10/2024    PHQ2-9 Depression Screening   Katie Potts interest or pleasure in doing things Several days  Feeling down, depressed, or hopeless Not at all  PHQ-2 - Total Score 1  Trouble falling or staying asleep, or sleeping too much    Feeling tired or having Katie Potts energy    Poor appetite or overeating     Feeling bad about yourself - or that you are a failure or have let yourself or your family down    Trouble concentrating on things, such as reading the newspaper or watching television    Moving or speaking so slowly that other people could have noticed.  Or the opposite - being so fidgety or restless that you have been moving around a lot more than usual    Thoughts that you would be better off dead, or hurting yourself in some way    PHQ2-9 Total Score    If you checked off any problems, how difficult have these problems made it for you to do your work, take care of things at home, or get along with other people    Depression Interventions/Treatment      There were no vitals filed for this visit. Pain Scale: Not given for pain  Medications Reviewed Today     Reviewed by Katie Clayborne CROME, RN (Registered Nurse) on 12/10/24 at 1150  Med List Status: <None>   Medication Order Taking? Sig Documenting Provider Last Dose Status Informant  acetaminophen  (TYLENOL ) 500 MG tablet  [id number]  Take 500 mg by mouth every 6 (six) hours as needed for moderate pain (pain score 4-6). Patient is taking 1-2 tablets as needed for mild to moderate back pain and hand pain. [provider]  Active   albuterol  (VENTOLIN  HFA) 108 (90 Base) MCG/ACT inhaler [id number]  Inhale 2 puffs into  the lungs every 6 (six) hours as needed for wheezing or shortness of breath. Georgina Speaks, FNP  Active   aspirin EC 81 MG tablet [id number]  Take 81 mg by mouth daily. [provider]  Active   Cholecalciferol (VITAMIN D3) 125 MCG (5000 UT) CAPS [id number]  Take 5,000 Units by mouth daily. [provider]  Active   clonazePAM (KLONOPIN) 1 MG tablet [id number]  Take 1 mg by mouth 2 (two) times daily. 1/2 tablet by mouth up to twice daily as needed for anxiety or panic attacks. [provider]  Active   diphenhydrAMINE  HCl (BENADRYL  ALLERGY PO) [id number]  Take 2 tablets by mouth as needed. [provider]  Active   Evolocumab  (REPATHA  SURECLICK) 140 MG/ML SOAJ [id number]  Inject 140 mg into the skin every 14 (fourteen) days. Raford Riggs, MD  Active   FLUoxetine (PROZAC) 20 MG capsule [id number]  Take 40 mg by mouth every morning. [provider]  Active   hydrALAZINE  (APRESOLINE ) 50 MG tablet [id number]  TAKE 1 TABLET(50 MG) BY MOUTH TWICE DAILY Raford Riggs, MD  Active   hydrochlorothiazide  (HYDRODIURIL ) 12.5 MG tablet [id number]  TAKE 1 TABLET(12.5 MG) BY MOUTH DAILY Georgina Speaks, FNP  Active   NEXLETOL  180 MG TABS [id number]  TAKE 1 TABLET(180 MG) BY MOUTH DAILY Raford Riggs, MD  Active   NIFEdipine  (PROCARDIA -XL/NIFEDICAL-XL) 30 MG 24 hr tablet [id number]  TAKE 1 TABLET(30 MG) BY MOUTH DAILY Raford Riggs, MD  Active   potassium chloride  (KLOR-CON  M) 10 MEQ tablet [id number]  Take 1 tablet (10 mEq total) by mouth 2 (two) times daily. Vannie Reche RAMAN, NP  Active   semaglutide -weight management (WEGOVY ) 1 MG/0.5ML SOAJ SQ injection [id number]  ADMINISTER 1 MG UNDER THE SKIN 1 TIME A WEEK Raford Riggs, MD  Active   zolpidem (AMBIEN) 10 MG tablet [id number]  Take 10 mg by mouth at bedtime as needed for sleep. [provider]  Active             Recommendation:   Continue Current Plan of Care  Follow Up Plan:   Closing From:   Complex Care Management  Clayborne Ly RN BSN CCM [facility]  [facility], [facility] Nurse Care Coordinator  Direct Dial: [phone] Website: [email or web address]

## 2024-12-17 ENCOUNTER — Telehealth: Payer: Self-pay | Admitting: Nurse Practitioner

## 2024-12-17 ENCOUNTER — Other Ambulatory Visit: Payer: Self-pay | Admitting: Nurse Practitioner

## 2024-12-17 DIAGNOSIS — R778 Other specified abnormalities of plasma proteins: Secondary | ICD-10-CM

## 2024-12-17 NOTE — Telephone Encounter (Signed)
 I contacted Katie Potts to review the labs completed 10/30/2024, 11/12/2024 and 11/19/2024.  She understands results are not suggestive of multiple myeloma.  We decided to repeat some of the labs at a 3 to 68-month interval and schedule an office visit to review results.  She will return for labs in mid February.  We will plan to see her in follow-up 1 week after to review results.

## 2024-12-18 ENCOUNTER — Telehealth: Payer: Self-pay | Admitting: Nurse Practitioner

## 2024-12-18 NOTE — Telephone Encounter (Signed)
 Called PT to scheduled FU day and time confirmed.

## 2024-12-26 ENCOUNTER — Telehealth: Payer: Self-pay | Admitting: Nurse Practitioner

## 2024-12-26 ENCOUNTER — Inpatient Hospital Stay

## 2024-12-26 ENCOUNTER — Inpatient Hospital Stay: Admitting: Genetic Counselor

## 2024-12-28 ENCOUNTER — Other Ambulatory Visit: Payer: Self-pay | Admitting: Licensed Clinical Social Worker

## 2025-01-01 NOTE — Patient Instructions (Signed)
 Visit Information  Thank you for taking time to visit with me today. Please don't hesitate to contact me if I can be of assistance to you before our next scheduled appointment.  Your next care management appointment is by telephone on 2/6 at 11:30 aM  Please call the care guide team at [phone] if you need to cancel, schedule, or reschedule an appointment.   Please call the Suicide and Crisis Lifeline: 988 go to [facility] Urgent [facility] [address], [place] [phone]) call 911 if you are experiencing a Mental Health or Behavioral Health Crisis or need someone to talk to.  Rolin Kerns, LCSW [place]  [facility], [facility] Clinical Social Worker Direct Dial: [phone]  Fax: [phone] Website: [email or web address] 10:10 AM

## 2025-01-01 NOTE — Patient Outreach (Signed)
 Complex Care Management   Visit Note  12/28/2024  Name:  Katie Potts MRN: [id number] DOB: 1951-11-19  Situation: Referral received for Complex Care Management related to Stress I obtained verbal consent from Patient.  Visit completed with Patient  on the phone  Background:   Past Medical History:  Diagnosis Date   Asthma    Chest pain of uncertain etiology 03/16/2021   Depression    MAJOR DEPRESSION, DR. KAUR;PSYCHIATRIST   HTN (hypertension) 07/06/2019   Hyperlipidemia    Hypertensive heart disease with chronic diastolic congestive heart failure (HCC) 07/06/2019   Hypokalemia 03/16/2021   Shortness of breath 09/16/2024   Snoring 03/16/2021    Assessment: Patient Reported Symptoms:  Cognitive Cognitive Status: No symptoms reported, Alert and oriented to person, place, and time, Normal speech and language skills Cognitive/Intellectual Conditions Management [RPT]: None reported or documented in medical history or problem list   Health Maintenance Behaviors: Annual physical exam  Neurological Neurological Review of Symptoms: Not assessed    HEENT HEENT Symptoms Reported: No symptoms reported      Cardiovascular Cardiovascular Symptoms Reported: No symptoms reported    Respiratory Respiratory Symptoms Reported: No symptoms reported    Endocrine Endocrine Symptoms Reported: No symptoms reported    Gastrointestinal Gastrointestinal Symptoms Reported: Not assessed      Genitourinary Genitourinary Symptoms Reported: Not assessed    Integumentary Integumentary Symptoms Reported: Not assessed    Musculoskeletal Musculoskelatal Symptoms Reviewed: No symptoms reported        Psychosocial Psychosocial Symptoms Reported: Other Other Psychosocial Conditions: Stress Behavioral Management Strategies: Adequate rest, Coping strategies, Support system, [employer] [facility] Comment: Psychosocial stressors endorsed Major Change/Loss/Stressor/Fears (CP): Medical  condition, family, Medical condition, self, Resources, Scientist, Research (life Sciences) to [employer] with Loss/Stress/Change: Diversional activities, Spiritual practice(s) Quality of Family Relationships: involved, stressful    01/01/2025    PHQ2-9 Depression Screening   Little interest or pleasure in doing things    Feeling down, depressed, or hopeless    PHQ-2 - Total Score    Trouble falling or staying asleep, or sleeping too much    Feeling tired or having little energy    Poor appetite or overeating     Feeling bad about yourself - or that you are a failure or have let yourself or your family down    Trouble concentrating on things, such as reading the newspaper or watching television    Moving or speaking so slowly that other people could have noticed.  Or the opposite - being so fidgety or restless that you have been moving around a lot more than usual    Thoughts that you would be better off dead, or hurting yourself in some way    PHQ2-9 Total Score    If you checked off any problems, how difficult have these problems made it for you to do your work, take care of things at home, or get along with other people    Depression Interventions/Treatment      There were no vitals filed for this visit.    Medications Reviewed Today     Reviewed by Lacee Grey D, LCSW (Social Worker) on 01/01/25 at [id number]  Med List Status: <None>   Medication Order Taking? Sig Documenting Provider Last Dose Status Informant  acetaminophen  (TYLENOL ) 500 MG tablet [id number]  Take 500 mg by mouth every 6 (six) hours as needed for moderate pain (pain score 4-6). Patient is taking 1-2 tablets as needed for mild to moderate back pain and hand pain.  [provider]  Active   albuterol  (VENTOLIN  HFA) 108 (90 Base) MCG/ACT inhaler [id number]  Inhale 2 puffs into the lungs every 6 (six) hours as needed for wheezing or shortness of breath. Georgina Speaks, FNP  Active   aspirin EC 81 MG tablet [id number]  Take 81 mg by  mouth daily. [provider]  Active   Cholecalciferol (VITAMIN D3) 125 MCG (5000 UT) CAPS [id number]  Take 5,000 Units by mouth daily. [provider]  Active   clonazePAM (KLONOPIN) 1 MG tablet [id number]  Take 1 mg by mouth 2 (two) times daily. 1/2 tablet by mouth up to twice daily as needed for anxiety or panic attacks. [provider]  Active   diphenhydrAMINE  HCl (BENADRYL  ALLERGY PO) [id number]  Take 2 tablets by mouth as needed. [provider]  Active   Evolocumab  (REPATHA  SURECLICK) 140 MG/ML SOAJ [id number]  Inject 140 mg into the skin every 14 (fourteen) days. Raford Riggs, MD  Active   FLUoxetine (PROZAC) 20 MG capsule [id number]  Take 40 mg by mouth every morning. [provider]  Active   hydrALAZINE  (APRESOLINE ) 50 MG tablet [id number]  TAKE 1 TABLET(50 MG) BY MOUTH TWICE DAILY Raford Riggs, MD  Active   hydrochlorothiazide  (HYDRODIURIL ) 12.5 MG tablet [id number]  TAKE 1 TABLET(12.5 MG) BY MOUTH DAILY Georgina Speaks, FNP  Active   NEXLETOL  180 MG TABS [id number]  TAKE 1 TABLET(180 MG) BY MOUTH DAILY Raford Riggs, MD  Active   NIFEdipine  (PROCARDIA -XL/NIFEDICAL-XL) 30 MG 24 hr tablet [id number]  TAKE 1 TABLET(30 MG) BY MOUTH DAILY Raford Riggs, MD  Active   potassium chloride  (KLOR-CON  M) 10 MEQ tablet [id number]  Take 1 tablet (10 mEq total) by mouth 2 (two) times daily. Vannie Reche RAMAN, NP  Active   semaglutide -weight management (WEGOVY ) 1 MG/0.5ML SOAJ SQ injection [id number]  ADMINISTER 1 MG UNDER THE SKIN 1 TIME A WEEK Raford Riggs, MD  Active   zolpidem (AMBIEN) 10 MG tablet [id number]  Take 10 mg by mouth at bedtime as needed for sleep. [provider]  Active             Recommendation:   Continue Current Plan of Care  Follow Up Plan:   Telephone follow-up in 1 month  Rolin Kerns, LCSW [facility]  [facility], [facility] Clinical Social Worker Direct Dial: [phone]   Fax: [phone] Website: [email or web address] 10:09 AM

## 2025-01-23 ENCOUNTER — Ambulatory Visit (HOSPITAL_BASED_OUTPATIENT_CLINIC_OR_DEPARTMENT_OTHER): Admitting: Family Medicine

## 2025-01-24 ENCOUNTER — Telehealth: Payer: Self-pay | Admitting: Cardiovascular Disease

## 2025-01-24 NOTE — Telephone Encounter (Signed)
 Spoke with patient regarding Nexletol   She had been getting for $0 copay until this year, other medications this year have been zero or $5 copays  Patient makes less than $75,000 a year  Will forward to Pharm D team for possible grant

## 2025-01-24 NOTE — Telephone Encounter (Signed)
 Pt c/o medication issue:  1. Name of Medication: NEXLETOL  180 MG TABS   2. How are you currently taking this medication (dosage and times per day)? As written  3. Are you having a reaction (difficulty breathing--STAT)? no  4. What is your medication issue? Pt would like a c/b regarding above medication. Pt went to the pharmacy and it's costing $92. Please advice

## 2025-01-25 ENCOUNTER — Other Ambulatory Visit (HOSPITAL_COMMUNITY): Payer: Self-pay

## 2025-01-25 ENCOUNTER — Other Ambulatory Visit: Payer: Self-pay | Admitting: Licensed Clinical Social Worker

## 2025-01-25 ENCOUNTER — Telehealth: Payer: Self-pay | Admitting: Pharmacy Technician

## 2025-01-25 NOTE — Patient Instructions (Signed)
 Visit Information  Thank you for taking time to visit with me today. Please don't hesitate to contact me if I can be of assistance to you before our next scheduled appointment.  Your next care management appointment is by telephone on 3/20 at 11:30 PM  Please call the care guide team at [phone] if you need to cancel, schedule, or reschedule an appointment.   Please call the Suicide and Crisis Lifeline: 988 go to [facility] Urgent [facility] [address], [place] [phone]) call 911 if you are experiencing a Mental Health or Behavioral Health Crisis or need someone to talk to.  Rolin Kerns, LCSW [place]  [facility], [facility] Clinical Social Worker Direct Dial: [phone]  Fax: [phone] Website: [email or web address] 6:35 PM

## 2025-01-25 NOTE — Patient Outreach (Signed)
 Complex Care Management   Visit Note  01/25/2025  Name:  Katie Potts MRN: [id number] DOB: 1951/02/23  Situation: Referral received for Complex Care Management related to Stress I obtained verbal consent from Patient.  Visit completed with Patient  on the phone  Background:   Past Medical History:  Diagnosis Date   Asthma    Chest pain of uncertain etiology 03/16/2021   Depression    MAJOR DEPRESSION, DR. KAUR;PSYCHIATRIST   HTN (hypertension) 07/06/2019   Hyperlipidemia    Hypertensive heart disease with chronic diastolic congestive heart failure (HCC) 07/06/2019   Hypokalemia 03/16/2021   Shortness of breath 09/16/2024   Snoring 03/16/2021    Assessment: Patient Reported Symptoms:  Cognitive Cognitive Status: No symptoms reported, Alert and oriented to person, place, and time, Normal speech and language skills Cognitive/Intellectual Conditions Management [RPT]: None reported or documented in medical history or problem list   Health Maintenance Behaviors: Annual physical exam  Neurological Neurological Review of Symptoms: Not assessed    HEENT HEENT Symptoms Reported: Not assessed      Cardiovascular Cardiovascular Symptoms Reported: Not assessed    Respiratory Respiratory Symptoms Reported: Not assesed    Endocrine Endocrine Symptoms Reported: Not assessed    Gastrointestinal Gastrointestinal Symptoms Reported: Not assessed      Genitourinary Genitourinary Symptoms Reported: Not assessed    Integumentary Integumentary Symptoms Reported: Not assessed    Musculoskeletal Musculoskelatal Symptoms Reviewed: Not assessed        Psychosocial Psychosocial Symptoms Reported: Other Other Psychosocial Conditions: Stress Behavioral Management Strategies: Adequate rest, Support system, Coping strategies Major Change/Loss/Stressor/Fears (CP): Medical condition, self, Medical condition, family, Resources, Environment Techniques to [employer] with Loss/Stress/Change: Diversional  activities, Spiritual practice(s) Quality of Family Relationships: involved, stressful, supportive    01/25/2025    PHQ2-9 Depression Screening   Little interest or pleasure in doing things    Feeling down, depressed, or hopeless    PHQ-2 - Total Score    Trouble falling or staying asleep, or sleeping too much    Feeling tired or having little energy    Poor appetite or overeating     Feeling bad about yourself - or that you are a failure or have let yourself or your family down    Trouble concentrating on things, such as reading the newspaper or watching television    Moving or speaking so slowly that other people could have noticed.  Or the opposite - being so fidgety or restless that you have been moving around a lot more than usual    Thoughts that you would be better off dead, or hurting yourself in some way    PHQ2-9 Total Score    If you checked off any problems, how difficult have these problems made it for you to do your work, take care of things at home, or get along with other people    Depression Interventions/Treatment      There were no vitals filed for this visit.    Medications Reviewed Today     Reviewed by Alida Greiner D, LCSW (Social Worker) on 01/25/25 at 1830  Med List Status: <None>   Medication Order Taking? Sig Documenting Provider Last Dose Status Informant  acetaminophen  (TYLENOL ) 500 MG tablet [id number]  Take 500 mg by mouth every 6 (six) hours as needed for moderate pain (pain score 4-6). Patient is taking 1-2 tablets as needed for mild to moderate back pain and hand pain. [provider]  Active   albuterol  (VENTOLIN  HFA) 108 (90  Base) MCG/ACT inhaler [id number]  Inhale 2 puffs into the lungs every 6 (six) hours as needed for wheezing or shortness of breath. Georgina Speaks, FNP  Active   aspirin EC 81 MG tablet [id number]  Take 81 mg by mouth daily. [provider]  Active   Cholecalciferol (VITAMIN D3) 125 MCG (5000 UT) CAPS [id number]  Take  5,000 Units by mouth daily. [provider]  Active   clonazePAM (KLONOPIN) 1 MG tablet [id number]  Take 1 mg by mouth 2 (two) times daily. 1/2 tablet by mouth up to twice daily as needed for anxiety or panic attacks. [provider]  Active   diphenhydrAMINE  HCl (BENADRYL  ALLERGY PO) [id number]  Take 2 tablets by mouth as needed. [provider]  Active   Evolocumab  (REPATHA  SURECLICK) 140 MG/ML SOAJ [id number]  Inject 140 mg into the skin every 14 (fourteen) days. Raford Riggs, MD  Active   FLUoxetine (PROZAC) 20 MG capsule [id number]  Take 40 mg by mouth every morning. [provider]  Active   hydrALAZINE  (APRESOLINE ) 50 MG tablet [id number]  TAKE 1 TABLET(50 MG) BY MOUTH TWICE DAILY Raford Riggs, MD  Active   hydrochlorothiazide  (HYDRODIURIL ) 12.5 MG tablet [id number]  TAKE 1 TABLET(12.5 MG) BY MOUTH DAILY Georgina Speaks, FNP  Active   NEXLETOL  180 MG TABS [id number]  TAKE 1 TABLET(180 MG) BY MOUTH DAILY Raford Riggs, MD  Active   NIFEdipine  (PROCARDIA -XL/NIFEDICAL-XL) 30 MG 24 hr tablet [id number]  TAKE 1 TABLET(30 MG) BY MOUTH DAILY Raford Riggs, MD  Active   potassium chloride  (KLOR-CON  M) 10 MEQ tablet [id number]  Take 1 tablet (10 mEq total) by mouth 2 (two) times daily. Vannie Reche RAMAN, NP  Active   semaglutide -weight management (WEGOVY ) 1 MG/0.5ML SOAJ SQ injection [id number]  ADMINISTER 1 MG UNDER THE SKIN 1 TIME A WEEK Raford Riggs, MD  Active   zolpidem (AMBIEN) 10 MG tablet [id number]  Take 10 mg by mouth at bedtime as needed for sleep. [provider]  Active             Recommendation:   Continue Current Plan of Care  Follow Up Plan:   Telephone follow-up in 1 month  Rolin Kerns, LCSW [facility]  [facility], [facility] Clinical Social Worker Direct Dial: [phone]  Fax: [phone] Website: [email or web address] 6:34 PM

## 2025-01-25 NOTE — Telephone Encounter (Signed)
 Patient Advocate Encounter   The patient was approved for a [employer] grant that will help cover the cost of Nexletol   Effective: 12/17/24 - 12/16/25   APW:[id number] ERW:[id number] Hmnle:[id number] PI:[id number]  [employer] ID: [id number]   Pharmacy provided with approval and processing information. They will notify patient when its ready

## 2025-02-04 ENCOUNTER — Inpatient Hospital Stay

## 2025-02-11 ENCOUNTER — Inpatient Hospital Stay: Admitting: Nurse Practitioner

## 2025-02-21 ENCOUNTER — Ambulatory Visit (HOSPITAL_BASED_OUTPATIENT_CLINIC_OR_DEPARTMENT_OTHER): Admitting: Cardiovascular Disease

## 2025-03-05 ENCOUNTER — Ambulatory Visit (HOSPITAL_BASED_OUTPATIENT_CLINIC_OR_DEPARTMENT_OTHER): Admitting: Family Medicine

## 2025-03-08 ENCOUNTER — Telehealth: Admitting: Licensed Clinical Social Worker

## 2025-10-02 ENCOUNTER — Ambulatory Visit: Payer: Self-pay | Admitting: Nurse Practitioner
